# Patient Record
Sex: Male | Born: 1955
Health system: Southern US, Community
[De-identification: ages and names within clinical notes are randomized; demographics above are authoritative.]

## PROBLEM LIST (undated history)

## (undated) DIAGNOSIS — I252 Old myocardial infarction: Secondary | ICD-10-CM

## (undated) DIAGNOSIS — J449 Chronic obstructive pulmonary disease, unspecified: Secondary | ICD-10-CM

## (undated) DIAGNOSIS — M545 Low back pain, unspecified: Secondary | ICD-10-CM

## (undated) DIAGNOSIS — F319 Bipolar disorder, unspecified: Secondary | ICD-10-CM

## (undated) DIAGNOSIS — E785 Hyperlipidemia, unspecified: Secondary | ICD-10-CM

## (undated) DIAGNOSIS — R110 Nausea: Secondary | ICD-10-CM

## (undated) DIAGNOSIS — E119 Type 2 diabetes mellitus without complications: Secondary | ICD-10-CM

## (undated) DIAGNOSIS — D649 Anemia, unspecified: Secondary | ICD-10-CM

## (undated) DIAGNOSIS — R062 Wheezing: Secondary | ICD-10-CM

## (undated) DIAGNOSIS — R259 Unspecified abnormal involuntary movements: Secondary | ICD-10-CM

## (undated) DIAGNOSIS — R609 Edema, unspecified: Secondary | ICD-10-CM

## (undated) DIAGNOSIS — I219 Acute myocardial infarction, unspecified: Secondary | ICD-10-CM

## (undated) DIAGNOSIS — N183 Chronic kidney disease, stage 3 unspecified: Secondary | ICD-10-CM

## (undated) DIAGNOSIS — K635 Polyp of colon: Secondary | ICD-10-CM

## (undated) DIAGNOSIS — F411 Generalized anxiety disorder: Secondary | ICD-10-CM

## (undated) DIAGNOSIS — G250 Essential tremor: Secondary | ICD-10-CM

## (undated) DIAGNOSIS — J45909 Unspecified asthma, uncomplicated: Secondary | ICD-10-CM

## (undated) DIAGNOSIS — R42 Dizziness and giddiness: Secondary | ICD-10-CM

## (undated) DIAGNOSIS — Z9861 Coronary angioplasty status: Secondary | ICD-10-CM

## (undated) DIAGNOSIS — D696 Thrombocytopenia, unspecified: Secondary | ICD-10-CM

## (undated) DIAGNOSIS — D692 Other nonthrombocytopenic purpura: Secondary | ICD-10-CM

## (undated) DIAGNOSIS — M1710 Unilateral primary osteoarthritis, unspecified knee: Secondary | ICD-10-CM

## (undated) DIAGNOSIS — K219 Gastro-esophageal reflux disease without esophagitis: Secondary | ICD-10-CM

## (undated) DIAGNOSIS — F329 Major depressive disorder, single episode, unspecified: Secondary | ICD-10-CM

## (undated) DIAGNOSIS — I2510 Atherosclerotic heart disease of native coronary artery without angina pectoris: Secondary | ICD-10-CM

## (undated) DIAGNOSIS — N644 Mastodynia: Secondary | ICD-10-CM

## (undated) DIAGNOSIS — G4700 Insomnia, unspecified: Secondary | ICD-10-CM

## (undated) DIAGNOSIS — J309 Allergic rhinitis, unspecified: Secondary | ICD-10-CM

## (undated) DIAGNOSIS — Z720 Tobacco use: Secondary | ICD-10-CM

## (undated) HISTORY — DX: Essential tremor: G25.0

## (undated) HISTORY — PX: ROTATOR CUFF REPAIR: SHX139

## (undated) HISTORY — DX: Insomnia, unspecified: G47.00

## (undated) HISTORY — DX: Generalized anxiety disorder: F41.1

## (undated) HISTORY — DX: Gastro-esophageal reflux disease without esophagitis: K21.9

## (undated) HISTORY — PX: OTHER SURGICAL HISTORY: SHX169

## (undated) HISTORY — DX: Polyp of colon: K63.5

## (undated) HISTORY — PX: TONSILLECTOMY: SUR1361

## (undated) HISTORY — DX: Tobacco use: Z72.0

## (undated) HISTORY — DX: Hyperlipidemia, unspecified: E78.5

## (undated) HISTORY — PX: APPENDECTOMY: SHX54

## (undated) HISTORY — DX: Bipolar disorder, unspecified: F31.9

## (undated) HISTORY — PX: CHOLECYSTECTOMY: SHX55

## (undated) HISTORY — PX: TOTAL KNEE ARTHROPLASTY: SHX125

## (undated) HISTORY — DX: Low back pain: M54.5

## (undated) HISTORY — DX: Unilateral primary osteoarthritis, unspecified knee: M17.10

## (undated) HISTORY — DX: Old myocardial infarction: I25.2

## (undated) HISTORY — DX: Unspecified abnormal involuntary movements: R25.9

## (undated) HISTORY — DX: Nausea: R11.0

## (undated) HISTORY — DX: Low back pain, unspecified: M54.50

## (undated) HISTORY — DX: Unspecified asthma, uncomplicated: J45.909

## (undated) HISTORY — DX: Allergic rhinitis, unspecified: J30.9

## (undated) HISTORY — DX: Major depressive disorder, single episode, unspecified: F32.9

## (undated) HISTORY — DX: Thrombocytopenia, unspecified: D69.6

## (undated) HISTORY — PX: KNEE ARTHROSCOPY: SUR90

## (undated) HISTORY — DX: Chronic obstructive pulmonary disease, unspecified: J44.9

## (undated) HISTORY — DX: Edema, unspecified: R60.9

## (undated) HISTORY — DX: Mastodynia: N64.4

## (undated) HISTORY — DX: Wheezing: R06.2

## (undated) HISTORY — DX: Other nonthrombocytopenic purpura: D69.2

## (undated) HISTORY — DX: Anemia, unspecified: D64.9

## (undated) HISTORY — DX: Type 2 diabetes mellitus without complications: E11.9

---

## 1998-07-15 ENCOUNTER — Emergency Department (HOSPITAL_COMMUNITY): Admission: EM | Admit: 1998-07-15 | Discharge: 1998-07-15 | Payer: Self-pay | Admitting: Emergency Medicine

## 1998-08-03 ENCOUNTER — Emergency Department (HOSPITAL_COMMUNITY): Admission: EM | Admit: 1998-08-03 | Discharge: 1998-08-03 | Payer: Self-pay

## 1999-01-10 ENCOUNTER — Emergency Department (HOSPITAL_COMMUNITY): Admission: EM | Admit: 1999-01-10 | Discharge: 1999-01-10 | Payer: Self-pay

## 1999-07-15 ENCOUNTER — Emergency Department (HOSPITAL_COMMUNITY): Admission: EM | Admit: 1999-07-15 | Discharge: 1999-07-15 | Payer: Self-pay | Admitting: Emergency Medicine

## 1999-07-24 ENCOUNTER — Emergency Department (HOSPITAL_COMMUNITY): Admission: EM | Admit: 1999-07-24 | Discharge: 1999-07-24 | Payer: Self-pay | Admitting: Emergency Medicine

## 1999-08-18 ENCOUNTER — Emergency Department (HOSPITAL_COMMUNITY): Admission: EM | Admit: 1999-08-18 | Discharge: 1999-08-18 | Payer: Self-pay | Admitting: Emergency Medicine

## 1999-09-04 ENCOUNTER — Emergency Department (HOSPITAL_COMMUNITY): Admission: EM | Admit: 1999-09-04 | Discharge: 1999-09-04 | Payer: Self-pay | Admitting: Emergency Medicine

## 1999-11-08 ENCOUNTER — Emergency Department (HOSPITAL_COMMUNITY): Admission: EM | Admit: 1999-11-08 | Discharge: 1999-11-08 | Payer: Self-pay | Admitting: Emergency Medicine

## 2000-07-23 ENCOUNTER — Emergency Department (HOSPITAL_COMMUNITY): Admission: EM | Admit: 2000-07-23 | Discharge: 2000-07-23 | Payer: Self-pay | Admitting: Emergency Medicine

## 2000-08-07 ENCOUNTER — Encounter: Admission: RE | Admit: 2000-08-07 | Discharge: 2000-08-07 | Payer: Self-pay | Admitting: General Surgery

## 2000-08-07 ENCOUNTER — Encounter: Payer: Self-pay | Admitting: General Surgery

## 2000-08-08 ENCOUNTER — Ambulatory Visit (HOSPITAL_BASED_OUTPATIENT_CLINIC_OR_DEPARTMENT_OTHER): Admission: RE | Admit: 2000-08-08 | Discharge: 2000-08-08 | Payer: Self-pay | Admitting: General Surgery

## 2002-05-26 ENCOUNTER — Emergency Department (HOSPITAL_COMMUNITY): Admission: EM | Admit: 2002-05-26 | Discharge: 2002-05-26 | Payer: Self-pay | Admitting: Emergency Medicine

## 2002-09-12 ENCOUNTER — Encounter (HOSPITAL_BASED_OUTPATIENT_CLINIC_OR_DEPARTMENT_OTHER): Payer: Self-pay | Admitting: General Surgery

## 2002-09-17 ENCOUNTER — Ambulatory Visit (HOSPITAL_COMMUNITY): Admission: RE | Admit: 2002-09-17 | Discharge: 2002-09-17 | Payer: Self-pay | Admitting: General Surgery

## 2002-09-17 ENCOUNTER — Encounter (INDEPENDENT_AMBULATORY_CARE_PROVIDER_SITE_OTHER): Payer: Self-pay | Admitting: *Deleted

## 2002-11-21 ENCOUNTER — Emergency Department (HOSPITAL_COMMUNITY): Admission: EM | Admit: 2002-11-21 | Discharge: 2002-11-21 | Payer: Self-pay | Admitting: Emergency Medicine

## 2003-01-26 ENCOUNTER — Emergency Department (HOSPITAL_COMMUNITY): Admission: EM | Admit: 2003-01-26 | Discharge: 2003-01-26 | Payer: Self-pay

## 2003-04-27 ENCOUNTER — Emergency Department (HOSPITAL_COMMUNITY): Admission: EM | Admit: 2003-04-27 | Discharge: 2003-04-27 | Payer: Self-pay | Admitting: Emergency Medicine

## 2003-04-27 ENCOUNTER — Encounter: Payer: Self-pay | Admitting: Emergency Medicine

## 2003-09-14 ENCOUNTER — Emergency Department (HOSPITAL_COMMUNITY): Admission: EM | Admit: 2003-09-14 | Discharge: 2003-09-14 | Payer: Self-pay | Admitting: Emergency Medicine

## 2003-09-30 ENCOUNTER — Encounter: Admission: RE | Admit: 2003-09-30 | Discharge: 2003-09-30 | Payer: Self-pay | Admitting: Internal Medicine

## 2004-03-07 ENCOUNTER — Emergency Department (HOSPITAL_COMMUNITY): Admission: EM | Admit: 2004-03-07 | Discharge: 2004-03-07 | Payer: Self-pay | Admitting: Emergency Medicine

## 2004-07-28 ENCOUNTER — Ambulatory Visit (HOSPITAL_COMMUNITY): Admission: RE | Admit: 2004-07-28 | Discharge: 2004-07-28 | Payer: Self-pay | Admitting: Orthopedic Surgery

## 2004-08-15 ENCOUNTER — Inpatient Hospital Stay (HOSPITAL_COMMUNITY): Admission: RE | Admit: 2004-08-15 | Discharge: 2004-08-18 | Payer: Self-pay | Admitting: Orthopedic Surgery

## 2005-11-02 ENCOUNTER — Emergency Department (HOSPITAL_COMMUNITY): Admission: EM | Admit: 2005-11-02 | Discharge: 2005-11-02 | Payer: Self-pay | Admitting: Emergency Medicine

## 2006-01-18 ENCOUNTER — Ambulatory Visit: Payer: Self-pay | Admitting: Internal Medicine

## 2006-01-22 ENCOUNTER — Ambulatory Visit: Payer: Self-pay | Admitting: Internal Medicine

## 2006-02-01 ENCOUNTER — Ambulatory Visit (HOSPITAL_COMMUNITY): Admission: RE | Admit: 2006-02-01 | Discharge: 2006-02-01 | Payer: Self-pay | Admitting: Orthopedic Surgery

## 2006-07-15 ENCOUNTER — Emergency Department (HOSPITAL_COMMUNITY): Admission: EM | Admit: 2006-07-15 | Discharge: 2006-07-16 | Payer: Self-pay | Admitting: Emergency Medicine

## 2006-07-21 ENCOUNTER — Encounter (INDEPENDENT_AMBULATORY_CARE_PROVIDER_SITE_OTHER): Payer: Self-pay | Admitting: Specialist

## 2006-07-21 ENCOUNTER — Ambulatory Visit (HOSPITAL_COMMUNITY): Admission: RE | Admit: 2006-07-21 | Discharge: 2006-07-22 | Payer: Self-pay | Admitting: General Surgery

## 2006-12-10 ENCOUNTER — Ambulatory Visit: Payer: Self-pay | Admitting: Internal Medicine

## 2006-12-10 LAB — CONVERTED CEMR LAB
ALT: 45 units/L — ABNORMAL HIGH (ref 0–40)
AST: 32 units/L (ref 0–37)
Albumin: 4 g/dL (ref 3.5–5.2)
Alkaline Phosphatase: 142 units/L — ABNORMAL HIGH (ref 39–117)
BUN: 8 mg/dL (ref 6–23)
Basophils Absolute: 0 10*3/uL (ref 0.0–0.1)
Basophils Relative: 0.1 % (ref 0.0–1.0)
Bilirubin Urine: NEGATIVE
Bilirubin, Direct: 0.1 mg/dL (ref 0.0–0.3)
CO2: 27 meq/L (ref 19–32)
Calcium: 9.6 mg/dL (ref 8.4–10.5)
Chloride: 104 meq/L (ref 96–112)
Cholesterol: 182 mg/dL (ref 0–200)
Creatinine, Ser: 1 mg/dL (ref 0.4–1.5)
Eosinophils Absolute: 0.3 10*3/uL (ref 0.0–0.6)
Eosinophils Relative: 4.2 % (ref 0.0–5.0)
GFR calc Af Amer: 102 mL/min
GFR calc non Af Amer: 84 mL/min
Glucose, Bld: 119 mg/dL — ABNORMAL HIGH (ref 70–99)
HCT: 48.7 % (ref 39.0–52.0)
HDL: 29.7 mg/dL — ABNORMAL LOW (ref >39.0)
Hemoglobin, Urine: NEGATIVE
Hemoglobin: 17 g/dL (ref 13.0–17.0)
Ketones, ur: NEGATIVE mg/dL
LDL Cholesterol: 117 mg/dL — ABNORMAL HIGH (ref 0–99)
Leukocytes, UA: NEGATIVE
Lymphocytes Relative: 35 % (ref 12.0–46.0)
MCHC: 35 g/dL (ref 30.0–36.0)
MCV: 89.4 fL (ref 78.0–100.0)
Monocytes Absolute: 0.6 10*3/uL (ref 0.2–0.7)
Monocytes Relative: 8.9 % (ref 3.0–11.0)
Neutro Abs: 3.5 10*3/uL (ref 1.4–7.7)
Neutrophils Relative %: 51.8 % (ref 43.0–77.0)
Nitrite: NEGATIVE
PSA: 0.29 ng/mL (ref 0.10–4.00)
Platelets: 225 10*3/uL (ref 150–400)
Potassium: 4.2 meq/L (ref 3.5–5.1)
RBC: 5.45 M/uL (ref 4.22–5.81)
RDW: 13.8 % (ref 11.5–14.6)
Sodium: 139 meq/L (ref 135–145)
Specific Gravity, Urine: >=1.03 (ref 1.000–1.03)
TSH: 0.76 microintl units/mL (ref 0.35–5.50)
Total Bilirubin: 0.6 mg/dL (ref 0.3–1.2)
Total CHOL/HDL Ratio: 6.1
Total Protein, Urine: NEGATIVE mg/dL
Total Protein: 7 g/dL (ref 6.0–8.3)
Triglycerides: 178 mg/dL — ABNORMAL HIGH (ref 0–149)
Urine Glucose: NEGATIVE mg/dL
Urobilinogen, UA: 0.2 (ref 0.0–1.0)
VLDL: 36 mg/dL (ref 0–40)
WBC: 6.7 10*3/uL (ref 4.5–10.5)
pH: 6 (ref 5.0–8.0)

## 2007-06-21 ENCOUNTER — Encounter: Payer: Self-pay | Admitting: Internal Medicine

## 2007-06-21 DIAGNOSIS — R259 Unspecified abnormal involuntary movements: Secondary | ICD-10-CM | POA: Insufficient documentation

## 2007-06-21 HISTORY — DX: Unspecified abnormal involuntary movements: R25.9

## 2007-06-24 DIAGNOSIS — J45909 Unspecified asthma, uncomplicated: Secondary | ICD-10-CM | POA: Insufficient documentation

## 2007-06-24 DIAGNOSIS — F411 Generalized anxiety disorder: Secondary | ICD-10-CM | POA: Insufficient documentation

## 2007-06-24 DIAGNOSIS — F329 Major depressive disorder, single episode, unspecified: Secondary | ICD-10-CM

## 2007-06-24 DIAGNOSIS — F32A Depression, unspecified: Secondary | ICD-10-CM | POA: Insufficient documentation

## 2007-06-24 DIAGNOSIS — F3289 Other specified depressive episodes: Secondary | ICD-10-CM

## 2007-06-24 HISTORY — DX: Major depressive disorder, single episode, unspecified: F32.9

## 2007-06-24 HISTORY — DX: Generalized anxiety disorder: F41.1

## 2007-06-24 HISTORY — DX: Other specified depressive episodes: F32.89

## 2007-06-24 HISTORY — DX: Unspecified asthma, uncomplicated: J45.909

## 2007-09-03 ENCOUNTER — Telehealth (INDEPENDENT_AMBULATORY_CARE_PROVIDER_SITE_OTHER): Payer: Self-pay | Admitting: *Deleted

## 2007-09-03 DIAGNOSIS — M545 Low back pain, unspecified: Secondary | ICD-10-CM

## 2007-09-03 HISTORY — DX: Low back pain, unspecified: M54.50

## 2007-11-24 ENCOUNTER — Encounter: Payer: Self-pay | Admitting: Internal Medicine

## 2007-11-24 LAB — CONVERTED CEMR LAB
BUN: 13 mg/dL (ref 6–23)
Basophils Absolute: 0 10*3/uL (ref 0.0–0.1)
Basophils Relative: 0 % (ref 0–1)
CO2: 28 meq/L (ref 19–32)
Calcium: 9.5 mg/dL (ref 8.4–10.5)
Chloride: 104 meq/L (ref 96–112)
Cholesterol: 206 mg/dL — ABNORMAL HIGH (ref 0–200)
Creatinine, Ser: 1.19 mg/dL (ref 0.40–1.50)
Eosinophils Absolute: 0.3 10*3/uL (ref 0.0–0.7)
Eosinophils Relative: 4 % (ref 0–5)
Glucose, Bld: 77 mg/dL (ref 70–99)
HCT: 48.8 % (ref 39.0–52.0)
HDL: 28 mg/dL — ABNORMAL LOW (ref >39)
Hemoglobin: 16.4 g/dL (ref 13.0–17.0)
LDL Cholesterol: 132 mg/dL — ABNORMAL HIGH (ref 0–99)
Lymphocytes Relative: 31 % (ref 12–46)
Lymphs Abs: 2.3 10*3/uL (ref 0.7–4.0)
MCHC: 33.6 g/dL (ref 30.0–36.0)
MCV: 91.7 fL (ref 78.0–100.0)
Monocytes Absolute: 0.9 10*3/uL (ref 0.1–1.0)
Monocytes Relative: 12 % (ref 3–12)
Neutro Abs: 3.9 10*3/uL (ref 1.7–7.7)
Neutrophils Relative %: 53 % (ref 43–77)
Platelets: 185 10*3/uL (ref 150–400)
Potassium: 3.9 meq/L (ref 3.5–5.3)
RBC: 5.32 M/uL (ref 4.22–5.81)
RDW: 14.4 % (ref 11.5–15.5)
Sodium: 143 meq/L (ref 135–145)
Total CHOL/HDL Ratio: 7.4
Triglycerides: 232 mg/dL — ABNORMAL HIGH (ref <150)
VLDL: 46 mg/dL — ABNORMAL HIGH (ref 0–40)
Valproic Acid Lvl: 89.5 ug/mL (ref 50.0–100.0)
WBC: 7.4 10*3/uL (ref 4.0–10.5)

## 2007-11-26 ENCOUNTER — Ambulatory Visit: Payer: Self-pay | Admitting: Internal Medicine

## 2007-11-26 DIAGNOSIS — J45901 Unspecified asthma with (acute) exacerbation: Secondary | ICD-10-CM | POA: Insufficient documentation

## 2007-11-27 LAB — CONVERTED CEMR LAB
ALT: 34 units/L (ref 0–53)
AST: 24 units/L (ref 0–37)
Albumin: 4.2 g/dL (ref 3.5–5.2)
Alkaline Phosphatase: 131 units/L — ABNORMAL HIGH (ref 39–117)
BUN: 13 mg/dL (ref 6–23)
Basophils Absolute: 0.1 10*3/uL (ref 0.0–0.1)
Basophils Relative: 1 % (ref 0.0–1.0)
Bilirubin Urine: NEGATIVE
Bilirubin, Direct: 0.2 mg/dL (ref 0.0–0.3)
CO2: 32 meq/L (ref 19–32)
Calcium: 9.7 mg/dL (ref 8.4–10.5)
Chloride: 99 meq/L (ref 96–112)
Cholesterol: 203 mg/dL (ref 0–200)
Creatinine, Ser: 1 mg/dL (ref 0.4–1.5)
Direct LDL: 143.1 mg/dL
Eosinophils Absolute: 0.2 10*3/uL (ref 0.0–0.6)
Eosinophils Relative: 3 % (ref 0.0–5.0)
GFR calc Af Amer: 102 mL/min
GFR calc non Af Amer: 84 mL/min
Glucose, Bld: 120 mg/dL — ABNORMAL HIGH (ref 70–99)
HCT: 42.3 % (ref 39.0–52.0)
HDL: 25.1 mg/dL — ABNORMAL LOW (ref >39.0)
Hemoglobin, Urine: NEGATIVE
Hemoglobin: 13.9 g/dL (ref 13.0–17.0)
Ketones, ur: 15 mg/dL — AB
Leukocytes, UA: NEGATIVE
Lymphocytes Relative: 39.4 % (ref 12.0–46.0)
MCHC: 32.8 g/dL (ref 30.0–36.0)
MCV: 91.5 fL (ref 78.0–100.0)
Monocytes Absolute: 0.7 10*3/uL (ref 0.2–0.7)
Monocytes Relative: 9.8 % (ref 3.0–11.0)
Neutro Abs: 3.4 10*3/uL (ref 1.4–7.7)
Neutrophils Relative %: 46.8 % (ref 43.0–77.0)
Nitrite: NEGATIVE
PSA: 0.4 ng/mL (ref 0.10–4.00)
Platelets: 173 10*3/uL (ref 150–400)
Potassium: 3.9 meq/L (ref 3.5–5.1)
RBC: 4.62 M/uL (ref 4.22–5.81)
RDW: 13.5 % (ref 11.5–14.6)
Sodium: 138 meq/L (ref 135–145)
Specific Gravity, Urine: 1.02 (ref 1.000–1.03)
TSH: 0.6 microintl units/mL (ref 0.35–5.50)
Total Bilirubin: 0.9 mg/dL (ref 0.3–1.2)
Total CHOL/HDL Ratio: 8.1
Total Protein, Urine: NEGATIVE mg/dL
Total Protein: 7.4 g/dL (ref 6.0–8.3)
Triglycerides: 221 mg/dL (ref 0–149)
Urine Glucose: NEGATIVE mg/dL
Urobilinogen, UA: 1 (ref 0.0–1.0)
VLDL: 44 mg/dL — ABNORMAL HIGH (ref 0–40)
WBC: 7.3 10*3/uL (ref 4.5–10.5)
pH: 7 (ref 5.0–8.0)

## 2007-11-29 ENCOUNTER — Telehealth: Payer: Self-pay | Admitting: Internal Medicine

## 2008-04-06 ENCOUNTER — Telehealth: Payer: Self-pay | Admitting: Internal Medicine

## 2008-07-10 ENCOUNTER — Encounter: Admission: RE | Admit: 2008-07-10 | Discharge: 2008-07-10 | Payer: Self-pay | Admitting: General Surgery

## 2008-07-16 ENCOUNTER — Encounter: Payer: Self-pay | Admitting: Internal Medicine

## 2008-07-21 ENCOUNTER — Ambulatory Visit: Payer: Self-pay | Admitting: Internal Medicine

## 2008-07-21 DIAGNOSIS — E1169 Type 2 diabetes mellitus with other specified complication: Secondary | ICD-10-CM | POA: Insufficient documentation

## 2008-07-21 DIAGNOSIS — E119 Type 2 diabetes mellitus without complications: Secondary | ICD-10-CM

## 2008-07-21 DIAGNOSIS — E785 Hyperlipidemia, unspecified: Secondary | ICD-10-CM | POA: Insufficient documentation

## 2008-07-21 DIAGNOSIS — Z794 Long term (current) use of insulin: Secondary | ICD-10-CM

## 2008-07-21 DIAGNOSIS — N644 Mastodynia: Secondary | ICD-10-CM | POA: Insufficient documentation

## 2008-07-21 DIAGNOSIS — R609 Edema, unspecified: Secondary | ICD-10-CM | POA: Insufficient documentation

## 2008-07-21 DIAGNOSIS — IMO0001 Reserved for inherently not codable concepts without codable children: Secondary | ICD-10-CM | POA: Insufficient documentation

## 2008-07-21 HISTORY — DX: Mastodynia: N64.4

## 2008-07-21 HISTORY — DX: Edema, unspecified: R60.9

## 2008-07-21 HISTORY — DX: Hyperlipidemia, unspecified: E78.5

## 2008-07-21 HISTORY — DX: Type 2 diabetes mellitus without complications: E11.9

## 2008-07-23 ENCOUNTER — Encounter: Payer: Self-pay | Admitting: Internal Medicine

## 2008-07-23 LAB — CONVERTED CEMR LAB
Cholesterol: 193 mg/dL (ref 0–200)
HDL: 24.9 mg/dL — ABNORMAL LOW (ref >39.0)
Hgb A1c MFr Bld: 6.7 % — ABNORMAL HIGH (ref 4.6–6.0)
LDL Cholesterol: 132 mg/dL — ABNORMAL HIGH (ref 0–99)
Total CHOL/HDL Ratio: 7.8
Triglycerides: 179 mg/dL — ABNORMAL HIGH (ref 0–149)
VLDL: 36 mg/dL (ref 0–40)

## 2008-07-27 ENCOUNTER — Telehealth (INDEPENDENT_AMBULATORY_CARE_PROVIDER_SITE_OTHER): Payer: Self-pay | Admitting: *Deleted

## 2008-08-03 ENCOUNTER — Encounter: Admission: RE | Admit: 2008-08-03 | Discharge: 2008-08-03 | Payer: Self-pay | Admitting: Internal Medicine

## 2008-08-25 ENCOUNTER — Ambulatory Visit: Payer: Self-pay | Admitting: Internal Medicine

## 2008-08-25 DIAGNOSIS — IMO0002 Reserved for concepts with insufficient information to code with codable children: Secondary | ICD-10-CM

## 2008-08-25 DIAGNOSIS — K219 Gastro-esophageal reflux disease without esophagitis: Secondary | ICD-10-CM | POA: Insufficient documentation

## 2008-08-25 DIAGNOSIS — M1710 Unilateral primary osteoarthritis, unspecified knee: Secondary | ICD-10-CM

## 2008-08-25 HISTORY — DX: Reserved for concepts with insufficient information to code with codable children: IMO0002

## 2008-08-25 HISTORY — DX: Gastro-esophageal reflux disease without esophagitis: K21.9

## 2008-08-25 LAB — CONVERTED CEMR LAB
ALT: 61 units/L — ABNORMAL HIGH (ref 0–53)
AST: 36 units/L (ref 0–37)
Albumin: 3.8 g/dL (ref 3.5–5.2)
Alkaline Phosphatase: 121 units/L — ABNORMAL HIGH (ref 39–117)
BUN: 12 mg/dL (ref 6–23)
Bilirubin, Direct: 0.2 mg/dL (ref 0.0–0.3)
CO2: 29 meq/L (ref 19–32)
Calcium: 9.5 mg/dL (ref 8.4–10.5)
Chloride: 102 meq/L (ref 96–112)
Cholesterol: 100 mg/dL (ref 0–200)
Creatinine, Ser: 0.9 mg/dL (ref 0.4–1.5)
GFR calc Af Amer: 114 mL/min
GFR calc non Af Amer: 95 mL/min
Glucose, Bld: 107 mg/dL — ABNORMAL HIGH (ref 70–99)
HDL: 25.9 mg/dL — ABNORMAL LOW (ref >39.0)
Hgb A1c MFr Bld: 6.7 % — ABNORMAL HIGH (ref 4.6–6.0)
LDL Cholesterol: 48 mg/dL (ref 0–99)
Potassium: 4 meq/L (ref 3.5–5.1)
Sodium: 138 meq/L (ref 135–145)
Total Bilirubin: 0.7 mg/dL (ref 0.3–1.2)
Total CHOL/HDL Ratio: 3.9
Total Protein: 7.2 g/dL (ref 6.0–8.3)
Triglycerides: 132 mg/dL (ref 0–149)
VLDL: 26 mg/dL (ref 0–40)

## 2008-08-31 ENCOUNTER — Telehealth (INDEPENDENT_AMBULATORY_CARE_PROVIDER_SITE_OTHER): Payer: Self-pay | Admitting: *Deleted

## 2008-12-28 ENCOUNTER — Telehealth (INDEPENDENT_AMBULATORY_CARE_PROVIDER_SITE_OTHER): Payer: Self-pay | Admitting: *Deleted

## 2009-01-01 ENCOUNTER — Ambulatory Visit: Payer: Self-pay | Admitting: Internal Medicine

## 2009-01-01 DIAGNOSIS — J209 Acute bronchitis, unspecified: Secondary | ICD-10-CM | POA: Insufficient documentation

## 2009-01-06 ENCOUNTER — Encounter: Payer: Self-pay | Admitting: *Deleted

## 2009-01-06 ENCOUNTER — Emergency Department (HOSPITAL_COMMUNITY): Admission: EM | Admit: 2009-01-06 | Discharge: 2009-01-07 | Payer: Self-pay | Admitting: *Deleted

## 2009-01-13 ENCOUNTER — Telehealth (INDEPENDENT_AMBULATORY_CARE_PROVIDER_SITE_OTHER): Payer: Self-pay | Admitting: *Deleted

## 2009-01-20 ENCOUNTER — Ambulatory Visit: Payer: Self-pay | Admitting: Internal Medicine

## 2009-01-25 ENCOUNTER — Telehealth: Payer: Self-pay | Admitting: Internal Medicine

## 2009-01-28 ENCOUNTER — Encounter: Payer: Self-pay | Admitting: Internal Medicine

## 2009-02-01 ENCOUNTER — Emergency Department (HOSPITAL_COMMUNITY): Admission: EM | Admit: 2009-02-01 | Discharge: 2009-02-02 | Payer: Self-pay | Admitting: Emergency Medicine

## 2009-03-18 ENCOUNTER — Ambulatory Visit: Payer: Self-pay | Admitting: Internal Medicine

## 2009-03-18 DIAGNOSIS — J309 Allergic rhinitis, unspecified: Secondary | ICD-10-CM

## 2009-03-18 HISTORY — DX: Allergic rhinitis, unspecified: J30.9

## 2009-03-18 LAB — CONVERTED CEMR LAB
ALT: 33 units/L (ref 0–53)
AST: 26 units/L (ref 0–37)
Albumin: 4.1 g/dL (ref 3.5–5.2)
Alkaline Phosphatase: 112 units/L (ref 39–117)
BUN: 12 mg/dL (ref 6–23)
Basophils Absolute: 0 10*3/uL (ref 0.0–0.1)
Basophils Relative: 0.2 % (ref 0.0–3.0)
Bilirubin Urine: NEGATIVE
Bilirubin, Direct: 0.1 mg/dL (ref 0.0–0.3)
CO2: 31 meq/L (ref 19–32)
Calcium: 9.3 mg/dL (ref 8.4–10.5)
Chloride: 103 meq/L (ref 96–112)
Cholesterol: 140 mg/dL (ref 0–200)
Creatinine, Ser: 0.9 mg/dL (ref 0.4–1.5)
Creatinine,U: 315.9 mg/dL
Eosinophils Absolute: 0.2 10*3/uL (ref 0.0–0.7)
Eosinophils Relative: 2.6 % (ref 0.0–5.0)
GFR calc non Af Amer: 94.25 mL/min (ref >60)
Glucose, Bld: 95 mg/dL (ref 70–99)
HCT: 42.3 % (ref 39.0–52.0)
HDL: 28.3 mg/dL — ABNORMAL LOW (ref >39.00)
Hemoglobin, Urine: NEGATIVE
Hemoglobin: 15.1 g/dL (ref 13.0–17.0)
Ketones, ur: NEGATIVE mg/dL
LDL Cholesterol: 82 mg/dL (ref 0–99)
Leukocytes, UA: NEGATIVE
Lymphocytes Relative: 37.9 % (ref 12.0–46.0)
Lymphs Abs: 2.5 10*3/uL (ref 0.7–4.0)
MCHC: 35.8 g/dL (ref 30.0–36.0)
MCV: 90.9 fL (ref 78.0–100.0)
Microalb Creat Ratio: 2.2 mg/g (ref 0.0–30.0)
Microalb, Ur: 0.7 mg/dL (ref 0.0–1.9)
Monocytes Absolute: 0.6 10*3/uL (ref 0.1–1.0)
Monocytes Relative: 9.3 % (ref 3.0–12.0)
Neutro Abs: 3.4 10*3/uL (ref 1.4–7.7)
Neutrophils Relative %: 50 % (ref 43.0–77.0)
Nitrite: NEGATIVE
PSA: 0.29 ng/mL (ref 0.10–4.00)
Platelets: 180 10*3/uL (ref 150.0–400.0)
Potassium: 3.9 meq/L (ref 3.5–5.1)
RBC: 4.65 M/uL (ref 4.22–5.81)
RDW: 14.4 % (ref 11.5–14.6)
Sodium: 140 meq/L (ref 135–145)
Specific Gravity, Urine: >=1.03 (ref 1.000–1.030)
TSH: 1.26 microintl units/mL (ref 0.35–5.50)
Total Bilirubin: 0.7 mg/dL (ref 0.3–1.2)
Total CHOL/HDL Ratio: 5
Total Protein: 7 g/dL (ref 6.0–8.3)
Triglycerides: 150 mg/dL — ABNORMAL HIGH (ref 0.0–149.0)
Urine Glucose: NEGATIVE mg/dL
Urobilinogen, UA: 0.2 (ref 0.0–1.0)
VLDL: 30 mg/dL (ref 0.0–40.0)
WBC: 6.7 10*3/uL (ref 4.5–10.5)
pH: 6 (ref 5.0–8.0)

## 2009-03-25 ENCOUNTER — Telehealth (INDEPENDENT_AMBULATORY_CARE_PROVIDER_SITE_OTHER): Payer: Self-pay | Admitting: *Deleted

## 2009-03-26 ENCOUNTER — Telehealth: Payer: Self-pay | Admitting: Internal Medicine

## 2009-03-29 ENCOUNTER — Telehealth (INDEPENDENT_AMBULATORY_CARE_PROVIDER_SITE_OTHER): Payer: Self-pay | Admitting: *Deleted

## 2009-03-30 ENCOUNTER — Ambulatory Visit: Payer: Self-pay

## 2009-03-30 ENCOUNTER — Encounter: Payer: Self-pay | Admitting: Internal Medicine

## 2009-03-31 ENCOUNTER — Telehealth: Payer: Self-pay | Admitting: Internal Medicine

## 2009-04-01 ENCOUNTER — Telehealth: Payer: Self-pay | Admitting: Internal Medicine

## 2009-05-26 ENCOUNTER — Telehealth: Payer: Self-pay | Admitting: Internal Medicine

## 2009-05-28 ENCOUNTER — Emergency Department (HOSPITAL_COMMUNITY): Admission: EM | Admit: 2009-05-28 | Discharge: 2009-05-28 | Payer: Self-pay | Admitting: Emergency Medicine

## 2009-05-31 ENCOUNTER — Ambulatory Visit: Payer: Self-pay | Admitting: Internal Medicine

## 2009-05-31 LAB — CONVERTED CEMR LAB
BUN: 5 mg/dL — ABNORMAL LOW (ref 6–23)
CO2: 32 meq/L (ref 19–32)
Calcium: 9.2 mg/dL (ref 8.4–10.5)
Chloride: 105 meq/L (ref 96–112)
Cholesterol: 151 mg/dL (ref 0–200)
Creatinine, Ser: 1 mg/dL (ref 0.4–1.5)
GFR calc non Af Amer: 83.4 mL/min (ref >60)
Glucose, Bld: 110 mg/dL — ABNORMAL HIGH (ref 70–99)
HDL: 34.1 mg/dL — ABNORMAL LOW (ref >39.00)
Hgb A1c MFr Bld: 6.3 % (ref 4.6–6.5)
LDL Cholesterol: 88 mg/dL (ref 0–99)
Potassium: 4.6 meq/L (ref 3.5–5.1)
Sodium: 142 meq/L (ref 135–145)
Total CHOL/HDL Ratio: 4
Triglycerides: 144 mg/dL (ref 0.0–149.0)
VLDL: 28.8 mg/dL (ref 0.0–40.0)

## 2009-07-13 ENCOUNTER — Ambulatory Visit: Payer: Self-pay | Admitting: Internal Medicine

## 2009-07-13 DIAGNOSIS — R062 Wheezing: Secondary | ICD-10-CM

## 2009-07-13 HISTORY — DX: Wheezing: R06.2

## 2009-08-05 ENCOUNTER — Ambulatory Visit: Payer: Self-pay | Admitting: Internal Medicine

## 2009-09-08 ENCOUNTER — Ambulatory Visit: Payer: Self-pay | Admitting: Internal Medicine

## 2009-09-08 LAB — CONVERTED CEMR LAB
BUN: 7 mg/dL (ref 6–23)
CO2: 31 meq/L (ref 19–32)
Calcium: 9.2 mg/dL (ref 8.4–10.5)
Chloride: 101 meq/L (ref 96–112)
Cholesterol: 106 mg/dL (ref 0–200)
Creatinine, Ser: 1.1 mg/dL (ref 0.4–1.5)
Direct LDL: 57 mg/dL
GFR calc non Af Amer: 74.63 mL/min (ref >60)
Glucose, Bld: 169 mg/dL — ABNORMAL HIGH (ref 70–99)
HDL: 30.5 mg/dL — ABNORMAL LOW (ref >39.00)
Hgb A1c MFr Bld: 7.3 % — ABNORMAL HIGH (ref 4.6–6.5)
Potassium: 4.5 meq/L (ref 3.5–5.1)
Sodium: 141 meq/L (ref 135–145)
Total CHOL/HDL Ratio: 3
Triglycerides: 201 mg/dL — ABNORMAL HIGH (ref 0.0–149.0)
VLDL: 40.2 mg/dL — ABNORMAL HIGH (ref 0.0–40.0)

## 2009-09-13 ENCOUNTER — Ambulatory Visit: Payer: Self-pay | Admitting: Internal Medicine

## 2009-10-04 ENCOUNTER — Telehealth: Payer: Self-pay | Admitting: Internal Medicine

## 2009-10-06 ENCOUNTER — Encounter: Payer: Self-pay | Admitting: Internal Medicine

## 2009-10-14 ENCOUNTER — Telehealth: Payer: Self-pay | Admitting: Internal Medicine

## 2009-11-08 ENCOUNTER — Ambulatory Visit: Payer: Self-pay | Admitting: Internal Medicine

## 2009-11-09 ENCOUNTER — Telehealth: Payer: Self-pay | Admitting: Internal Medicine

## 2009-11-10 ENCOUNTER — Encounter: Payer: Self-pay | Admitting: Internal Medicine

## 2009-11-11 LAB — CONVERTED CEMR LAB
ALT: 44 units/L (ref 0–53)
AST: 39 units/L — ABNORMAL HIGH (ref 0–37)
Albumin: 4.1 g/dL (ref 3.5–5.2)
Alkaline Phosphatase: 92 units/L (ref 39–117)
BUN: 11 mg/dL (ref 6–23)
Basophils Absolute: 0.2 10*3/uL — ABNORMAL HIGH (ref 0.0–0.1)
Basophils Relative: 2.3 % (ref 0.0–3.0)
Bilirubin Urine: NEGATIVE
Bilirubin, Direct: 0.1 mg/dL (ref 0.0–0.3)
CO2: 26 meq/L (ref 19–32)
Calcium: 9.7 mg/dL (ref 8.4–10.5)
Chloride: 106 meq/L (ref 96–112)
Cholesterol: 198 mg/dL (ref 0–200)
Creatinine, Ser: 0.9 mg/dL (ref 0.4–1.5)
Creatinine,U: 97.3 mg/dL
Direct LDL: 141.3 mg/dL
Eosinophils Absolute: 0.5 10*3/uL (ref 0.0–0.7)
Eosinophils Relative: 6.7 % — ABNORMAL HIGH (ref 0.0–5.0)
GFR calc non Af Amer: 94.02 mL/min (ref >60)
Glucose, Bld: 129 mg/dL — ABNORMAL HIGH (ref 70–99)
HCT: 44.1 % (ref 39.0–52.0)
HDL: 31.4 mg/dL — ABNORMAL LOW (ref >39.00)
Hemoglobin, Urine: NEGATIVE
Hemoglobin: 14.8 g/dL (ref 13.0–17.0)
Hgb A1c MFr Bld: 7.2 % — ABNORMAL HIGH (ref 4.6–6.5)
Ketones, ur: NEGATIVE mg/dL
Leukocytes, UA: NEGATIVE
Lymphocytes Relative: 38.2 % (ref 12.0–46.0)
Lymphs Abs: 2.8 10*3/uL (ref 0.7–4.0)
MCHC: 33.7 g/dL (ref 30.0–36.0)
MCV: 92 fL (ref 78.0–100.0)
Microalb Creat Ratio: 3.1 mg/g (ref 0.0–30.0)
Microalb, Ur: 0.3 mg/dL (ref 0.0–1.9)
Monocytes Absolute: 0.6 10*3/uL (ref 0.1–1.0)
Monocytes Relative: 8.4 % (ref 3.0–12.0)
Neutro Abs: 3.1 10*3/uL (ref 1.4–7.7)
Neutrophils Relative %: 44.4 % (ref 43.0–77.0)
Nitrite: NEGATIVE
PSA: 0.22 ng/mL (ref 0.10–4.00)
Platelets: 202 10*3/uL (ref 150.0–400.0)
Potassium: 4.7 meq/L (ref 3.5–5.1)
RBC: 4.79 M/uL (ref 4.22–5.81)
RDW: 12.8 % (ref 11.5–14.6)
Sodium: 138 meq/L (ref 135–145)
Specific Gravity, Urine: 1.02 (ref 1.000–1.030)
TSH: 1.06 microintl units/mL (ref 0.35–5.50)
Total Bilirubin: 0.6 mg/dL (ref 0.3–1.2)
Total CHOL/HDL Ratio: 6
Total Protein, Urine: NEGATIVE mg/dL
Total Protein: 7 g/dL (ref 6.0–8.3)
Triglycerides: 212 mg/dL — ABNORMAL HIGH (ref 0.0–149.0)
Urine Glucose: NEGATIVE mg/dL
Urobilinogen, UA: 0.2 (ref 0.0–1.0)
VLDL: 42.4 mg/dL — ABNORMAL HIGH (ref 0.0–40.0)
WBC: 7.2 10*3/uL (ref 4.5–10.5)
pH: 5.5 (ref 5.0–8.0)

## 2009-11-18 ENCOUNTER — Encounter: Payer: Self-pay | Admitting: Internal Medicine

## 2009-12-16 ENCOUNTER — Encounter: Payer: Self-pay | Admitting: Internal Medicine

## 2010-02-10 ENCOUNTER — Telehealth: Payer: Self-pay | Admitting: Internal Medicine

## 2010-02-28 ENCOUNTER — Encounter: Payer: Self-pay | Admitting: Internal Medicine

## 2010-03-22 ENCOUNTER — Ambulatory Visit: Payer: Self-pay | Admitting: Internal Medicine

## 2010-03-22 LAB — CONVERTED CEMR LAB
BUN: 12 mg/dL (ref 6–23)
CO2: 29 meq/L (ref 19–32)
Calcium: 9.6 mg/dL (ref 8.4–10.5)
Chloride: 99 meq/L (ref 96–112)
Cholesterol: 117 mg/dL (ref 0–200)
Creatinine, Ser: 1 mg/dL (ref 0.4–1.5)
GFR calc non Af Amer: 84.11 mL/min (ref >60)
Glucose, Bld: 174 mg/dL — ABNORMAL HIGH (ref 70–99)
HDL: 26.3 mg/dL — ABNORMAL LOW (ref >39.00)
Hgb A1c MFr Bld: 7.7 % — ABNORMAL HIGH (ref 4.6–6.5)
LDL Cholesterol: 56 mg/dL (ref 0–99)
Potassium: 4.5 meq/L (ref 3.5–5.1)
Sodium: 137 meq/L (ref 135–145)
Total CHOL/HDL Ratio: 4
Triglycerides: 175 mg/dL — ABNORMAL HIGH (ref 0.0–149.0)
VLDL: 35 mg/dL (ref 0.0–40.0)

## 2010-03-28 ENCOUNTER — Ambulatory Visit: Payer: Self-pay | Admitting: Internal Medicine

## 2010-03-28 DIAGNOSIS — G4700 Insomnia, unspecified: Secondary | ICD-10-CM | POA: Insufficient documentation

## 2010-03-28 HISTORY — DX: Insomnia, unspecified: G47.00

## 2010-04-06 ENCOUNTER — Encounter: Payer: Self-pay | Admitting: Internal Medicine

## 2010-04-06 ENCOUNTER — Telehealth: Payer: Self-pay | Admitting: Internal Medicine

## 2010-04-11 ENCOUNTER — Telehealth: Payer: Self-pay | Admitting: Internal Medicine

## 2010-04-13 ENCOUNTER — Telehealth: Payer: Self-pay | Admitting: Internal Medicine

## 2010-06-14 ENCOUNTER — Ambulatory Visit: Payer: Self-pay | Admitting: Internal Medicine

## 2010-06-14 ENCOUNTER — Telehealth: Payer: Self-pay | Admitting: Internal Medicine

## 2010-06-14 ENCOUNTER — Other Ambulatory Visit: Payer: Self-pay | Admitting: Emergency Medicine

## 2010-06-14 ENCOUNTER — Emergency Department (HOSPITAL_COMMUNITY): Admission: EM | Admit: 2010-06-14 | Discharge: 2010-06-14 | Payer: Self-pay | Admitting: Emergency Medicine

## 2010-06-14 DIAGNOSIS — R110 Nausea: Secondary | ICD-10-CM

## 2010-06-14 HISTORY — DX: Nausea: R11.0

## 2010-06-14 LAB — CONVERTED CEMR LAB
BUN: 8 mg/dL (ref 6–23)
CO2: 27 meq/L (ref 19–32)
Calcium: 9.2 mg/dL (ref 8.4–10.5)
Chloride: 87 meq/L — ABNORMAL LOW (ref 96–112)
Creatinine, Ser: 1 mg/dL (ref 0.4–1.5)
GFR calc non Af Amer: 85.02 mL/min (ref >60)
Glucose, Bld: 840 mg/dL (ref 70–99)
Hgb A1c MFr Bld: 13.4 % — ABNORMAL HIGH (ref 4.6–6.5)
Potassium: 4.5 meq/L (ref 3.5–5.1)
Sodium: 126 meq/L — ABNORMAL LOW (ref 135–145)

## 2010-06-17 ENCOUNTER — Telehealth: Payer: Self-pay | Admitting: Internal Medicine

## 2010-06-20 ENCOUNTER — Telehealth: Payer: Self-pay | Admitting: Internal Medicine

## 2010-06-28 ENCOUNTER — Ambulatory Visit: Payer: Self-pay | Admitting: Internal Medicine

## 2010-06-30 ENCOUNTER — Telehealth: Payer: Self-pay | Admitting: Internal Medicine

## 2010-07-04 ENCOUNTER — Telehealth: Payer: Self-pay | Admitting: Internal Medicine

## 2010-07-06 ENCOUNTER — Telehealth: Payer: Self-pay | Admitting: Internal Medicine

## 2010-07-11 ENCOUNTER — Telehealth: Payer: Self-pay | Admitting: Internal Medicine

## 2010-07-15 ENCOUNTER — Telehealth: Payer: Self-pay | Admitting: Internal Medicine

## 2010-08-22 ENCOUNTER — Telehealth (INDEPENDENT_AMBULATORY_CARE_PROVIDER_SITE_OTHER): Payer: Self-pay | Admitting: *Deleted

## 2010-09-23 ENCOUNTER — Ambulatory Visit: Payer: Self-pay | Admitting: Internal Medicine

## 2010-09-23 LAB — CONVERTED CEMR LAB
ALT: 30 units/L (ref 0–53)
AST: 42 units/L — ABNORMAL HIGH (ref 0–37)
Albumin: 3.9 g/dL (ref 3.5–5.2)
Alkaline Phosphatase: 92 units/L (ref 39–117)
BUN: 11 mg/dL (ref 6–23)
Basophils Absolute: 0.1 10*3/uL (ref 0.0–0.1)
Basophils Relative: 1.2 % (ref 0.0–3.0)
Bilirubin Urine: NEGATIVE
Bilirubin, Direct: 0.1 mg/dL (ref 0.0–0.3)
Blood, UA: NEGATIVE
CO2: 30 meq/L (ref 19–32)
Calcium: 9.3 mg/dL (ref 8.4–10.5)
Chloride: 101 meq/L (ref 96–112)
Cholesterol: 123 mg/dL (ref 0–200)
Creatinine, Ser: 1 mg/dL (ref 0.4–1.5)
Creatinine,U: 101.7 mg/dL
Direct LDL: 64.7 mg/dL
Eosinophils Absolute: 0.2 10*3/uL (ref 0.0–0.7)
Eosinophils Relative: 3.2 % (ref 0.0–5.0)
GFR calc non Af Amer: 88.04 mL/min (ref >60)
Glucose, Bld: 97 mg/dL (ref 70–99)
HCT: 44.5 % (ref 39.0–52.0)
HDL: 25.4 mg/dL — ABNORMAL LOW (ref >39.00)
Hemoglobin: 15.3 g/dL (ref 13.0–17.0)
Hgb A1c MFr Bld: 6.8 % — ABNORMAL HIGH (ref 4.6–6.5)
Ketones, ur: NEGATIVE mg/dL
Leukocytes, UA: NEGATIVE
Lymphocytes Relative: 40.1 % (ref 12.0–46.0)
Lymphs Abs: 3 10*3/uL (ref 0.7–4.0)
MCHC: 34.4 g/dL (ref 30.0–36.0)
MCV: 93.9 fL (ref 78.0–100.0)
Microalb Creat Ratio: 0.6 mg/g (ref 0.0–30.0)
Microalb, Ur: 0.6 mg/dL (ref 0.0–1.9)
Monocytes Absolute: 0.6 10*3/uL (ref 0.1–1.0)
Monocytes Relative: 8.5 % (ref 3.0–12.0)
Neutro Abs: 3.5 10*3/uL (ref 1.4–7.7)
Neutrophils Relative %: 47 % (ref 43.0–77.0)
Nitrite: NEGATIVE
PSA: 0.28 ng/mL (ref 0.10–4.00)
Platelets: 131 10*3/uL — ABNORMAL LOW (ref 150.0–400.0)
Potassium: 4.7 meq/L (ref 3.5–5.1)
RBC: 4.74 M/uL (ref 4.22–5.81)
RDW: 14.3 % (ref 11.5–14.6)
Sodium: 138 meq/L (ref 135–145)
Specific Gravity, Urine: 1.015 (ref 1.000–1.030)
TSH: 1.58 microintl units/mL (ref 0.35–5.50)
Total Bilirubin: 0.5 mg/dL (ref 0.3–1.2)
Total CHOL/HDL Ratio: 5
Total Protein, Urine: NEGATIVE mg/dL
Total Protein: 7 g/dL (ref 6.0–8.3)
Triglycerides: 243 mg/dL — ABNORMAL HIGH (ref 0.0–149.0)
Urine Glucose: NEGATIVE mg/dL
Urobilinogen, UA: 0.2 (ref 0.0–1.0)
VLDL: 48.6 mg/dL — ABNORMAL HIGH (ref 0.0–40.0)
WBC: 7.5 10*3/uL (ref 4.5–10.5)
pH: 6 (ref 5.0–8.0)

## 2010-09-27 ENCOUNTER — Ambulatory Visit: Payer: Self-pay | Admitting: Internal Medicine

## 2010-10-19 ENCOUNTER — Telehealth: Payer: Self-pay | Admitting: Internal Medicine

## 2010-10-21 ENCOUNTER — Encounter: Payer: Self-pay | Admitting: Internal Medicine

## 2010-11-22 NOTE — Progress Notes (Signed)
Summary: Glucose Log From Patient  Glucose Log From Patient   Imported ByLennie Odor 06/30/2010 16:00:56  _____________________________________________________________________  External Attachment:    Type:   Image     Comment:   External Document

## 2010-11-22 NOTE — Progress Notes (Signed)
Summary: CBG readings  Phone Note Call from Patient Call back at Home Phone [phone]   Caller: Patient Summary of Call: Pt called to inform MD of CBGs reading over the weekend:  Sat  12p 353 6p 242 11p 272 Sun 12p 228 6p 247 11p 178 Initial call taken by: Margaret Pyle, CMA,  June 20, 2010 1:34 PM  Follow-up for Phone Call        unfort, his BS are not dangerous but not good enough yet;  please start januvia 50 mg,  cont to monitor blood sugars and call this Thursday sept 1 with reading;    Should CONT all other meds   Follow-up by: Corwin Levins MD,  June 20, 2010 1:38 PM  Additional Follow-up for Phone Call Additional follow up Details #1::        pt informed and will keep a record of CBGs to bring to OV on Tue Sept 6th Additional Follow-up by: Margaret Pyle, CMA,  June 20, 2010 2:05 PM    New/Updated Medications: JANUVIA 50 MG TABS (SITAGLIPTIN PHOSPHATE) 1po once daily Prescriptions: JANUVIA 50 MG TABS (SITAGLIPTIN PHOSPHATE) 1po once daily  #30 x 11   Entered and Authorized by:   Corwin Levins MD   Signed by:   Corwin Levins MD on 06/20/2010   Method used:   Electronically to        [facility] [address] #[id number]* (retail)       [address] [place], [place]  [address]       Ph: [phone]       Fax: [phone]   RxID:   [id number]

## 2010-11-22 NOTE — Miscellaneous (Signed)
Summary: Order ONO/faxed to [facility]  Order ONO/faxed to [facility]   Imported By: Lester  11/11/2009 10:19:30  _____________________________________________________________________  External Attachment:    Type:   Image     Comment:   External Document

## 2010-11-22 NOTE — Progress Notes (Signed)
  Phone Note Outgoing Call   Call placed by: Robin Summary of Call: Called patient at his home to inform to go to ER immediately as received his Glucose lab report of 840 infomed MD stated dangerously high and go immediately, patient agreed to do so. Called [facility] and spoke to Triage RN Thurston Hole Stat to inform patient on his way, will fax todays office notes, medication list and labs from this morning to [phone]. Initial call taken by: Robin Ewing CMA Duncan Dull),  June 14, 2010 4:38 PM

## 2010-11-22 NOTE — Progress Notes (Signed)
Summary: CBG  Phone Note Call from Patient Call back at Home Phone [phone]   Caller: Patient Summary of Call: Pt called to report his CBGs, lowest 62 and highest 136.  Pt requested refill of ProAir:  Verbal given to pharmacist to approve 6 total. Original Rx in July not recieved. Initial call taken by: Margaret Pyle, CMA,  July 15, 2010 1:59 PM  Follow-up for Phone Call        ok to further decrease the glimeparide 2 mg down to HALF pill in the AM only Follow-up by: Margaret Pyle, CMA,  July 15, 2010 2:04 PM  Additional Follow-up for Phone Call Additional follow up Details #1::        Pt informed of decrease and Rx  Additional Follow-up by: Margaret Pyle, CMA,  July 15, 2010 2:27 PM    New/Updated Medications: GLIMEPIRIDE 2 MG TABS (GLIMEPIRIDE) 1/2 by mouth once daily

## 2010-11-22 NOTE — Progress Notes (Signed)
Summary: CBG  Phone Note Call from Patient Call back at Home Phone [phone]   Caller: Patient Summary of Call: Pt called with CBG since OV.  Wed: 339 12p 277 6p 350 11p   Thur 253 12p 240 6p 307p   Fri 239  12p Initial call taken by: Margaret Pyle, CMA,  June 17, 2010 1:43 PM  Follow-up for Phone Call        please ask pt his current med regimen for DM, including any pills or insulin Follow-up by: Corwin Levins MD,  June 17, 2010 1:59 PM  Additional Follow-up for Phone Call Additional follow up Details #1::        Metformin 1 500mg  tab & Glimeparide 1 4mg  tab after breakfast, Metformin 1 500mg  tab & Glimperide 1 4mg   tab after evening meal. Pt states that he notices CBG drop after exercising but he remains extremely thirst most of the time Additional Follow-up by: Margaret Pyle, CMA,  June 17, 2010 2:10 PM    Additional Follow-up for Phone Call Additional follow up Details #2::    ok to incr the metformin to 1000 two times a day  (watch for any diarrhea);  check cbg's and call on Monday Follow-up by: Corwin Levins MD,  June 17, 2010 2:22 PM  Additional Follow-up for Phone Call Additional follow up Details #3:: Details for Additional Follow-up Action Taken: Pt informed and will monitor CBG and call with reading Monday Additional Follow-up by: Margaret Pyle, CMA,  June 17, 2010 2:40 PM  New/Updated Medications: METFORMIN HCL 1000 MG TABS (METFORMIN HCL) 1po two times a day Prescriptions: METFORMIN HCL 1000 MG TABS (METFORMIN HCL) 1po two times a day  #60 x 11   Entered and Authorized by:   Corwin Levins MD   Signed by:   Corwin Levins MD on 06/17/2010   Method used:   Electronically to        [facility] [address] #[id number]* (retail)       [address] [place], [place]  [address]       Ph: [phone]       Fax: [phone]   RxID:   [id number]

## 2010-11-22 NOTE — Progress Notes (Signed)
Summary: Cold sxs  Phone Note Call from Patient Call back at Home Phone [phone]   Caller: Patient Summary of Call: Pt called stating that he is very sick, productive cough, ST and he is loosing his voice. Pt is requesting and Rx to his pharmacy. Please advise Initial call taken by: Margaret Pyle, CMA,  June 30, 2010 8:45 AM  Follow-up for Phone Call        ok for zpack - done per emr Follow-up by: Corwin Levins MD,  June 30, 2010 1:11 PM  Additional Follow-up for Phone Call Additional follow up Details #1::        Pt informed Additional Follow-up by: Margaret Pyle, CMA,  June 30, 2010 1:40 PM    New/Updated Medications: AZITHROMYCIN 250 MG TABS (AZITHROMYCIN) 2po qd for 1 day, then 1po qd for 4days, then stop Prescriptions: AZITHROMYCIN 250 MG TABS (AZITHROMYCIN) 2po qd for 1 day, then 1po qd for 4days, then stop  #6 x 1   Entered and Authorized by:   Corwin Levins MD   Signed by:   Corwin Levins MD on 06/30/2010   Method used:   Electronically to        [facility] [id number]* (retail)       [address] [place], [place]  [address]       Ph: [phone]       Fax: [phone]   RxID:   [id number]

## 2010-11-22 NOTE — Progress Notes (Signed)
Summary: REFILL  Phone Note From Pharmacy   Caller: [facility] [phone] Summary of Call: [facility] called req refill of duoneb.  Initial call taken by: Lamar Sprinkles, CMA,  February 10, 2010 2:23 PM  Follow-up for Phone Call        done hardcopy to LIM side B - dahlia  Follow-up by: Corwin Levins MD,  February 10, 2010 3:26 PM  Additional Follow-up for Phone Call Additional follow up Details #1::        RX faxed to [facility]  Additional Follow-up by: Margaret Pyle, CMA,  February 10, 2010 3:39 PM    New/Updated Medications: DUONEB 0.5-2.5 (3) MG/3ML SOLN (IPRATROPIUM-ALBUTEROL) use asd qid as needed Prescriptions: DUONEB 0.5-2.5 (3) MG/3ML SOLN (IPRATROPIUM-ALBUTEROL) use asd qid as needed  #90 x 3   Entered and Authorized by:   Corwin Levins MD   Signed by:   Corwin Levins MD on 02/10/2010   Method used:   Print then Give to Patient   RxID:   [id number]

## 2010-11-22 NOTE — Progress Notes (Signed)
Summary: Zolpidem PA  Phone Note From Pharmacy   Summary of Call: PA request--Zolpidem. Form completed. Initial call taken by: Lucious Groves,  April 06, 2010 9:30 AM  Follow-up for Phone Call        Forms faxed to [facility] Follow-up by: Margaret Pyle, CMA,  April 07, 2010 8:20 AM     Appended Document: Zolpidem PA Called to check on status and was notified that they had not received the fax form. PA was completed via phone and they will contact the office with decision via fax.

## 2010-11-22 NOTE — Progress Notes (Signed)
Summary: Zolpidem denied  Phone Note Other Incoming   Summary of Call: Zolpidem prior authorization has been denied stating that the patient must try preferred med--Lunesta. Please advise. Initial call taken by: Lucious Groves,  April 11, 2010 2:24 PM  Follow-up for Phone Call        ok for lunesta  2mg  - 1 at bedtime as needed   done hardcopy to LIM side B - dahlia  Follow-up by: Corwin Levins MD,  April 11, 2010 5:41 PM  Additional Follow-up for Phone Call Additional follow up Details #1::        pt informed, Rx faxed to pahrmacy per pt request Additional Follow-up by: Margaret Pyle, CMA,  April 12, 2010 8:17 AM    New/Updated Medications: LUNESTA 2 MG TABS (ESZOPICLONE) 1 by mouth at bedtime as needed sleep Prescriptions: LUNESTA 2 MG TABS (ESZOPICLONE) 1 by mouth at bedtime as needed sleep  #30 x 2   Entered and Authorized by:   Corwin Levins MD   Signed by:   Corwin Levins MD on 04/11/2010   Method used:   Print then Give to Patient   RxID:   [id number]

## 2010-11-22 NOTE — Progress Notes (Signed)
Summary: Rx req  Phone Note Call from Patient Call back at Home Phone [phone]   Caller: Patient Summary of Call: Pt called stating that he recently pulled a muscle in his back while exercising. Pt is requesting Rx for muscle relaxer. Pt states that he does not want to use Hydrocodone for muscle pain.  [facility] [address] Initial call taken by: Margaret Pyle, CMA,  April 13, 2010 11:12 AM  Follow-up for Phone Call        done escript Follow-up by: Corwin Levins MD,  April 13, 2010 12:57 PM  Additional Follow-up for Phone Call Additional follow up Details #1::        pt informed Additional Follow-up by: Margaret Pyle, CMA,  April 13, 2010 1:06 PM    New/Updated Medications: FLEXERIL 5 MG TABS (CYCLOBENZAPRINE HCL) 1po three times a day as needed Prescriptions: FLEXERIL 5 MG TABS (CYCLOBENZAPRINE HCL) 1po three times a day as needed  #60 x 1   Entered and Authorized by:   Corwin Levins MD   Signed by:   Corwin Levins MD on 04/13/2010   Method used:   Electronically to        [facility] [address] [id number]* (retail)       [address] [place], [place]  [address]       Ph: [phone]       Fax: [phone]   RxID:   [id number]
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
## 2010-11-22 NOTE — Medication Information (Signed)
Summary: [facility]  [facility]   Imported By: Sherian Rein 03/02/2010 13:38:33  _____________________________________________________________________  External Attachment:    Type:   Image     Comment:   External Document

## 2010-11-22 NOTE — Progress Notes (Signed)
Summary: PA Singulair  Phone Note Call from Patient Call back at Home Phone [phone]   Caller: Patient Summary of Call: Pt called stating that he was advised by pharmacy that PA is needed on Singulair. Pt says he will be out of medication 11/02. Form can be requested from [employer] or pharmacy, [facility] [address] [phone] Initial call taken by: Margaret Pyle, CMA,  August 22, 2010 2:47 PM  Follow-up for Phone Call        Pt picked up medication on 08/22/10. Follow-up by: Dagoberto Reef,  August 25, 2010 3:27 PM

## 2010-11-22 NOTE — Assessment & Plan Note (Signed)
Summary: BLOOD SUGAR PROBLEM--STC   Vital Signs:  Patient profile:   55 year old male Height:      69 inches Weight:      217.38 pounds BMI:     32.22 O2 Sat:      94 % on Room air Temp:     97.3 degrees F oral Pulse rate:   90 / minute BP sitting:   106 / 64  (left arm) Cuff size:   large  Vitals Entered By: Zella Ball Ewing CMA Duncan Dull) (June 14, 2010 10:19 AM)  O2 Flow:  Room air  CC: BS elevated, chest congestion, no appetite, thirsty/RE   Primary Care Provider:  Corwin Levins MD  CC:  BS elevated, chest congestion, no appetite, and thirsty/RE.  History of Present Illness: Here with c/o marked polydipsia and polyuria for ? reason ;  drinking copious fluids and urinating as well;  last a1c was 7.7  in may 2011; and he is somewhat evasive, but appears he has not been taking the metformin as he thought for some reason it was causing nausea (metformin "regular" was changed to the ER version but not clear if this caused the nausea);  thinks he has lost about 20 lbs in the past 2 wks;  had some blurry vision, saw optho who suggested he had blurriness due to his sugar.   CBG in the office reads "high"  tx with 8 units lantus, and 10 units humalog .  Not checking sugars at home as well.  Incidently also with 1 wk onset acute bronchitis symptoms with mild ST, and mild prod cough, not clear color sputum, and Pt denies CP, worsening sob, doe, wheezing, orthopnea, pnd, worsening LE edema, palps, dizziness or syncope  Pt denies new neuro symptoms such as headache, facial or extremity weakness  No fever, wt loss, night sweats, loss of appetite or other constitutional symptoms except for the above.    Problems Prior to Update: 1)  Bronchitis-acute  (ICD-466.0) 2)  Insomnia-sleep Disorder-unspec  (ICD-780.52) 3)  Peripheral Edema  (ICD-782.3) 4)  Wheezing  (ICD-786.07) 5)  Wheezing  (ICD-786.07) 6)  Preoperative Examination  (ICD-V72.84) 7)  Allergic Rhinitis  (ICD-477.9) 8)  Preventive Health  Care  (ICD-V70.0) 9)  Asthmatic Bronchitis, Acute  (ICD-466.0) 10)  Osteoarthritis, Knees, Bilateral  (ICD-715.96) 11)  Gerd  (ICD-530.81) 12)  Hepatotoxicity, Drug-induced, Risk of  (ICD-V58.69) 13)  Breast Pain, Left  (ICD-611.71) 14)  Hyperlipidemia  (ICD-272.4) 15)  Peripheral Edema  (ICD-782.3) 16)  Diabetes Mellitus, Type II  (ICD-250.00) 17)  Preventive Health Care  (ICD-V70.0) 18)  Asthma, With Acute Exacerbation  (ICD-493.92) 19)  Family History of Cad Male 1st Degree Relative <50  (ICD-V17.3) 20)  Low Back Pain  (ICD-724.2) 21)  Symptom, Abnormal Involuntary Movement Nec  (ICD-781.0) 22)  Depression  (ICD-311) 23)  Asthma  (ICD-493.90) 24)  Anxiety  (ICD-300.00)  Medications Prior to Update: 1)  Depakote 250 Mg  Tbec (Divalproex Sodium) .Marland Kitchen.. 1po Qid 2)  Hydrocodone-Acetaminophen 10-325 Mg  Tabs (Hydrocodone-Acetaminophen) .Marland Kitchen.. 1 By Mouth Q 6 Hrs Prn  As Needed - To Fill March 28, 2010 3)  Spiriva Handihaler 18 Mcg Caps (Tiotropium Bromide Monohydrate) .... One Puff Each Day 4)  Singulair 10 Mg Tabs (Montelukast Sodium) .... Take 1 Tablet By Mouth Once A Day 5)  Adult Aspirin Ec Low Strength 81 Mg Tbec (Aspirin) .Marland Kitchen.. 1 By Mouth Once Daily 6)  Metformin Hcl 500 Mg Xr24h-Tab (Metformin Hcl) .... 3 By Mouth Qam  7)  Simvastatin 40 Mg Tabs (Simvastatin) .Marland Kitchen.. 1 By Mouth Once Daily 8)  Cetirizine Hcl 10 Mg Tabs (Cetirizine Hcl) .Marland Kitchen.. 1 By Mouth Once Daily As Needed 9)  Lunesta 2 Mg Tabs (Eszopiclone) .Marland Kitchen.. 1 By Mouth At Bedtime As Needed Sleep 10)  Proair Hfa 108 (90 Base) Mcg/act Aers (Albuterol Sulfate) .... 2 Puffs Four Times Per Day As Needed For Wheezing 11)  Citalopram Hydrobromide 10 Mg Tabs (Citalopram Hydrobromide) .Marland Kitchen.. 1 By Mouth Once Daily 12)  Furosemide 20 Mg Tabs (Furosemide) .Marland Kitchen.. 1 By Mouth Once Daily As Needed Swelling 13)  Duoneb 0.5-2.5 (3) Mg/85ml Soln (Ipratropium-Albuterol) .... Use Asd Qid As Needed 14)  Flexeril 5 Mg Tabs (Cyclobenzaprine Hcl) .Marland Kitchen.. 1po Three Times  A Day As Needed  Current Medications (verified): 1)  Depakote 250 Mg  Tbec (Divalproex Sodium) .Marland Kitchen.. 1po Qid 2)  Hydrocodone-Acetaminophen 10-325 Mg  Tabs (Hydrocodone-Acetaminophen) .Marland Kitchen.. 1 By Mouth Q 6 Hrs Prn  As Needed - To Fill March 28, 2010 3)  Spiriva Handihaler 18 Mcg Caps (Tiotropium Bromide Monohydrate) .... One Puff Each Day 4)  Singulair 10 Mg Tabs (Montelukast Sodium) .... Take 1 Tablet By Mouth Once A Day 5)  Adult Aspirin Ec Low Strength 81 Mg Tbec (Aspirin) .Marland Kitchen.. 1 By Mouth Once Daily 6)  Metformin Hcl 500 Mg Tabs (Metformin Hcl) .Marland Kitchen.. 1po Two Times A Day 7)  Simvastatin 40 Mg Tabs (Simvastatin) .Marland Kitchen.. 1 By Mouth Once Daily 8)  Cetirizine Hcl 10 Mg Tabs (Cetirizine Hcl) .Marland Kitchen.. 1 By Mouth Once Daily As Needed 9)  Lunesta 2 Mg Tabs (Eszopiclone) .Marland Kitchen.. 1 By Mouth At Bedtime As Needed Sleep 10)  Proair Hfa 108 (90 Base) Mcg/act Aers (Albuterol Sulfate) .... 2 Puffs Four Times Per Day As Needed For Wheezing 11)  Citalopram Hydrobromide 10 Mg Tabs (Citalopram Hydrobromide) .Marland Kitchen.. 1 By Mouth Once Daily 12)  Furosemide 20 Mg Tabs (Furosemide) .Marland Kitchen.. 1 By Mouth Once Daily As Needed Swelling 13)  Duoneb 0.5-2.5 (3) Mg/11ml Soln (Ipratropium-Albuterol) .... Use Asd Qid As Needed 14)  Flexeril 5 Mg Tabs (Cyclobenzaprine Hcl) .Marland Kitchen.. 1po Three Times A Day As Needed 15)  Glimepiride 4 Mg Tabs (Glimepiride) .Marland Kitchen.. 1po Two Times A Day 16)  Cephalexin 500 Mg Caps (Cephalexin) .Marland Kitchen.. 1 Po Three Times A Day 17)  Onetouch Ultra Test  Strp (Glucose Blood) .... Use Asd Two Times A Day  250.02 18)  Lancets  Misc (Lancets) .... Use Asd Two Times A Day  - 250.02  Allergies (verified): 1)  ! * Iv Dye  Past History:  Past Medical History: Last updated: 03/18/2009 Anxiety Asthma Depression - disabled H/O Facial Tic/Tremor Low back pain - chronic Knee pain - chronic - s/p 9 surguries, disabled facial tic Diabetes mellitus, type II - diet Hyperlipidemia GERD Allergic rhinitis  Past Surgical History: Last  updated: 06/21/2007 R Knee Surgery X9 Rotator cuff repair- bilateral Inguinal herniorrhaphy Appendectomy Tonsillectomy Cholecystectomy  Family History: Last updated: 06/21/2007 Family History of CAD Male 1st degree relative <50  Social History: Last updated: 03/28/2010 Current Smoker Alcohol use-yes Divorced 1 son - lives with im 1 daughter - lives with ex-wife Drug use-no  Risk Factors: Smoking Status: current (06/21/2007) Packs/Day: 1 PPD (06/21/2007)  Review of Systems       all otherwise negative per pt -    Physical Exam  General:  alert and overweight-appearing.  , mild ill appaering Head:  normocephalic and atraumatic.   Eyes:  vision grossly intact, pupils equal, and pupils round.  Ears:  R ear normal and L ear normal.   Nose:  nasal dischargemucosal pallor and mucosal edema.   Mouth:  pharyngeal erythema and fair dentition.   Neck:  supple and no masses.   Lungs:  normal respiratory effort and normal breath sounds.   Heart:  normal rate and regular rhythm.   Abdomen:  soft, non-tender, and normal bowel sounds.   Extremities:  no edema, no erythema    Impression & Recommendations:  Problem # 1:  BRONCHITIS-ACUTE (ICD-466.0)  His updated medication list for this problem includes:    Spiriva Handihaler 18 Mcg Caps (Tiotropium bromide monohydrate) ..... One puff each day    Singulair 10 Mg Tabs (Montelukast sodium) .Marland Kitchen... Take 1 tablet by mouth once a day    Proair Hfa 108 (90 Base) Mcg/act Aers (Albuterol sulfate) .Marland Kitchen... 2 puffs four times per day as needed for wheezing    Duoneb 0.5-2.5 (3) Mg/68ml Soln (Ipratropium-albuterol) ..... Use asd qid as needed    Cephalexin 500 Mg Caps (Cephalexin) .Marland Kitchen... 1 po three times a day  Orders: T-2 View CXR, Same Day (71020.5TC) treat as above, f/u any worsening signs or symptoms , cant r/o pna - for cxr as well  Problem # 2:  DIABETES MELLITUS, TYPE II (ICD-250.00)  His updated medication list for this problem  includes:    Adult Aspirin Ec Low Strength 81 Mg Tbec (Aspirin) .Marland Kitchen... 1 by mouth once daily    Metformin Hcl 500 Mg Tabs (Metformin hcl) .Marland Kitchen... 1po two times a day    Glimepiride 4 Mg Tabs (Glimepiride) .Marland Kitchen... 1po two times a day  Orders: TLB-BMP (Basic Metabolic Panel-BMET) (80048-METABOL) TLB-A1C / Hgb A1C (Glycohemoglobin) (83036-A1C) by hx and cbg is uncontrolled, 8 units lantus and 10 units regular given approx noon today, Pt to cont DM diet, excercise, wt loss efforts; to check labs today ;  metformin rx decreased to two times a day (regular, not the ER version per pt preference), and glimeparide added; gave glucometer and to check cbg's, call with results  I suspect overall out of control a combination of noncompliance with meds and ? diet, as well as acute illness  Problem # 3:  NAUSEA (ICD-787.02) exam benign, suspect related to elev sugar;  to follow for now, no vomiting   Problem # 4:  ASTHMA (ICD-493.90)  His updated medication list for this problem includes:    Spiriva Handihaler 18 Mcg Caps (Tiotropium bromide monohydrate) ..... One puff each day    Singulair 10 Mg Tabs (Montelukast sodium) .Marland Kitchen... Take 1 tablet by mouth once a day    Proair Hfa 108 (90 Base) Mcg/act Aers (Albuterol sulfate) .Marland Kitchen... 2 puffs four times per day as needed for wheezing    Duoneb 0.5-2.5 (3) Mg/51ml Soln (Ipratropium-albuterol) ..... Use asd qid as needed o/w stable  - Continue all previous medications as before this visit   Complete Medication List: 1)  Depakote 250 Mg Tbec (Divalproex sodium) .Marland Kitchen.. 1po qid 2)  Hydrocodone-acetaminophen 10-325 Mg Tabs (Hydrocodone-acetaminophen) .Marland Kitchen.. 1 by mouth q 6 hrs prn  as needed - to fill March 28, 2010 3)  Spiriva Handihaler 18 Mcg Caps (Tiotropium bromide monohydrate) .... One puff each day 4)  Singulair 10 Mg Tabs (Montelukast sodium) .... Take 1 tablet by mouth once a day 5)  Adult Aspirin Ec Low Strength 81 Mg Tbec (Aspirin) .Marland Kitchen.. 1 by mouth once daily 6)   Metformin Hcl 500 Mg Tabs (Metformin hcl) .Marland Kitchen.. 1po two times a day 7)  Simvastatin 40 Mg Tabs (Simvastatin) .Marland Kitchen.. 1 by mouth once daily 8)  Cetirizine Hcl 10 Mg Tabs (Cetirizine hcl) .Marland Kitchen.. 1 by mouth once daily as needed 9)  Lunesta 2 Mg Tabs (Eszopiclone) .Marland Kitchen.. 1 by mouth at bedtime as needed sleep 10)  Proair Hfa 108 (90 Base) Mcg/act Aers (Albuterol sulfate) .... 2 puffs four times per day as needed for wheezing 11)  Citalopram Hydrobromide 10 Mg Tabs (Citalopram hydrobromide) .Marland Kitchen.. 1 by mouth once daily 12)  Furosemide 20 Mg Tabs (Furosemide) .Marland Kitchen.. 1 by mouth once daily as needed swelling 13)  Duoneb 0.5-2.5 (3) Mg/108ml Soln (Ipratropium-albuterol) .... Use asd qid as needed 14)  Flexeril 5 Mg Tabs (Cyclobenzaprine hcl) .Marland Kitchen.. 1po three times a day as needed 15)  Glimepiride 4 Mg Tabs (Glimepiride) .Marland Kitchen.. 1po two times a day 16)  Cephalexin 500 Mg Caps (Cephalexin) .Marland Kitchen.. 1 po three times a day 17)  Onetouch Ultra Test Strp (Glucose blood) .... Use asd two times a day  250.02 18)  Lancets Misc (Lancets) .... Use asd two times a day  - 250.02  Patient Instructions: 1)  start the metformin 500 mg two times a day (the regular kind, not the ER) for blood sugar 2)  start the glimeparide 4 mg two times a day (for blood sugar) 3)  start the cephalexin antibiotic as prescribed  4)  you were given 8 units of Lantus insulin today 5)  check your sugars at least two times a day, before meals, and call with results on thursday Aug 25  [phone] 6)  Please also call if your sugar seems to go too low (such as less than 90 to 100 , especially if you have symptoms of shakiness, weakness, hunger)   -   Keep hard candy in your pocket in case this happens 7)  Please to go ER if you are unable to get your sugars down under 500, as this can get worse and life-threatening 8)  Please take all new medications as prescribed - the antibiotic 9)  Please go to Radiology in the basement level for your X-Ray today  10)  Continue  all other previous medications as before this visit  11)  Please schedule a follow-up appointment in 2 weeks. Prescriptions: ONETOUCH ULTRA TEST  STRP (GLUCOSE BLOOD) use asd two times a day  250.02  #200 x 11   Entered and Authorized by:   Corwin Levins MD   Signed by:   Corwin Levins MD on 06/14/2010   Method used:   Print then Give to Patient   RxID:   [id number] LANCETS  MISC (LANCETS) use asd two times a day  - 250.02  #200 x 11   Entered and Authorized by:   Corwin Levins MD   Signed by:   Corwin Levins MD on 06/14/2010   Method used:   Print then Give to Patient   RxID:   [id number] [facility] ULTRA TEST  STRP (GLUCOSE BLOOD) use asd two times a day  #200 x 11   Entered and Authorized by:   Corwin Levins MD   Signed by:   Corwin Levins MD on 06/14/2010   Method used:   Print then Give to Patient   RxID:   [id number] CEPHALEXIN 500 MG CAPS (CEPHALEXIN) 1 po three times a day  #30 x 0   Entered and Authorized by:   Corwin Levins MD   Signed by:   Corwin Levins MD  on 06/14/2010   Method used:   Print then Give to Patient   RxID:   [id number] GLIMEPIRIDE 4 MG TABS (GLIMEPIRIDE) 1po two times a day  #180 x 3   Entered and Authorized by:   Corwin Levins MD   Signed by:   Corwin Levins MD on 06/14/2010   Method used:   Print then Give to Patient   RxID:   [id number] METFORMIN HCL 500 MG TABS (METFORMIN HCL) 1po two times a day  #180 x 3   Entered and Authorized by:   Corwin Levins MD   Signed by:   Corwin Levins MD on 06/14/2010   Method used:   Print then Give to Patient   RxID:   [id number]

## 2010-11-22 NOTE — Procedures (Signed)
Summary: Pulse Oximetry/[employer]  Pulse Oximetry/[employer]   Imported By: Sherian Rein 12/21/2009 07:41:15  _____________________________________________________________________  External Attachment:    Type:   Image     Comment:   External Document

## 2010-11-22 NOTE — Assessment & Plan Note (Signed)
Summary: 6 MO ROV /NWS  #   Vital Signs:  Patient profile:   55 year old male Height:      68 inches Weight:      234.25 pounds BMI:     35.75 O2 Sat:      94 % on Room air Temp:     98.3 degrees F oral Pulse rate:   90 / minute BP sitting:   100 / 60  (left arm) Cuff size:   large  Vitals Entered By: Zella Ball Ewing CMA Duncan Dull) (September 27, 2010 1:30 PM)  O2 Flow:  Room air  Preventive Care Screening  Last Flu Shot:    Date:  08/23/2010    Results:  given      declines colonoscopy - defer to next yr  CC: 6 month ROV/RE   Primary Care Provider:  Corwin Levins MD  CC:  6 month ROV/RE.  History of Present Illness: here for wellness and f/u;  overall doing well;  Pt denies CP, worsening sob, doe, wheezing, orthopnea, pnd, worsening LE edema, palps, dizziness or syncope  Pt denies new neuro symptoms such as headache, facial or extremity weakness  Pt denies polydipsia, polyuria, or low sugar symptoms such as shakiness improved with eating. except for low BS twice per month tx with hard candy  Overall good compliance with meds, trying to follow low chol, DM diet, wt stable, little excercise however No fever, wt loss, night sweats, loss of appetite or other constitutional symptoms  Denies worsening depressive symptoms, suicidal ideation, or panic.   Overall good compliance with meds, and good tolerability.  Pt states good ability with ADL's, low fall risk, home safety reviewed and adequate, no significant change in hearing or vision, trying to follow lower chol diet, and occasionally active only with regular excercise.  Would like alternative to lunesta as it and [place] have not been effective.     Problems Prior to Update: 1)  Nausea  (ICD-787.02) 2)  Insomnia-sleep Disorder-unspec  (ICD-780.52) 3)  Peripheral Edema  (ICD-782.3) 4)  Wheezing  (ICD-786.07) 5)  Wheezing  (ICD-786.07) 6)  Preoperative Examination  (ICD-V72.84) 7)  Allergic Rhinitis  (ICD-477.9) 8)  Preventive Health  Care  (ICD-V70.0) 9)  Asthmatic Bronchitis, Acute  (ICD-466.0) 10)  Osteoarthritis, Knees, Bilateral  (ICD-715.96) 11)  Gerd  (ICD-530.81) 12)  Hepatotoxicity, Drug-induced, Risk of  (ICD-V58.69) 13)  Breast Pain, Left  (ICD-611.71) 14)  Hyperlipidemia  (ICD-272.4) 15)  Peripheral Edema  (ICD-782.3) 16)  Diabetes Mellitus, Type II  (ICD-250.00) 17)  Preventive Health Care  (ICD-V70.0) 18)  Asthma, With Acute Exacerbation  (ICD-493.92) 19)  Family History of Cad Male 1st Degree Relative <50  (ICD-V17.3) 20)  Low Back Pain  (ICD-724.2) 21)  Symptom, Abnormal Involuntary Movement Nec  (ICD-781.0) 22)  Depression  (ICD-311) 23)  Asthma  (ICD-493.90) 24)  Anxiety  (ICD-300.00)  Medications Prior to Update: 1)  Depakote 250 Mg  Tbec (Divalproex Sodium) .Marland Kitchen.. 1po Qid 2)  Hydrocodone-Acetaminophen 10-325 Mg  Tabs (Hydrocodone-Acetaminophen) .Marland Kitchen.. 1 By Mouth Q 6 Hrs Prn  As Needed - To Fill March 28, 2010 3)  Spiriva Handihaler 18 Mcg Caps (Tiotropium Bromide Monohydrate) .... One Puff Each Day 4)  Singulair 10 Mg Tabs (Montelukast Sodium) .... Take 1 Tablet By Mouth Once A Day 5)  Adult Aspirin Ec Low Strength 81 Mg Tbec (Aspirin) .Marland Kitchen.. 1 By Mouth Once Daily 6)  Metformin Hcl 1000 Mg Tabs (Metformin Hcl) .Marland Kitchen.. 1po Two Times A Day  7)  Simvastatin 40 Mg Tabs (Simvastatin) .Marland Kitchen.. 1 By Mouth Once Daily 8)  Cetirizine Hcl 10 Mg Tabs (Cetirizine Hcl) .Marland Kitchen.. 1 By Mouth Once Daily As Needed 9)  Lunesta 2 Mg Tabs (Eszopiclone) .Marland Kitchen.. 1 By Mouth At Bedtime As Needed Sleep 10)  Proair Hfa 108 (90 Base) Mcg/act Aers (Albuterol Sulfate) .... 2 Puffs Four Times Per Day As Needed For Wheezing 11)  Citalopram Hydrobromide 10 Mg Tabs (Citalopram Hydrobromide) .Marland Kitchen.. 1 By Mouth Once Daily 12)  Furosemide 20 Mg Tabs (Furosemide) .Marland Kitchen.. 1 By Mouth Once Daily As Needed Swelling 13)  Duoneb 0.5-2.5 (3) Mg/33ml Soln (Ipratropium-Albuterol) .... Use Asd Qid As Needed 14)  Flexeril 5 Mg Tabs (Cyclobenzaprine Hcl) .Marland Kitchen.. 1po Three Times  A Day As Needed 15)  Glimepiride 2 Mg Tabs (Glimepiride) .... 1/2 By Mouth Once Daily 16)  Onetouch Ultra Test  Strp (Glucose Blood) .... Use Asd Three Times A Day  - 250.02 17)  Lancets  Misc (Lancets) .... Use Asd Two Times A Day  - 250.02 18)  Januvia 50 Mg Tabs (Sitagliptin Phosphate) .Marland Kitchen.. 1po Once Daily 19)  Doxycycline Hyclate 100 Mg Caps (Doxycycline Hyclate) .Marland Kitchen.. 1po Two Times A Day  Current Medications (verified): 1)  Depakote 250 Mg  Tbec (Divalproex Sodium) .Marland Kitchen.. 1po Qid 2)  Hydrocodone-Acetaminophen 10-325 Mg  Tabs (Hydrocodone-Acetaminophen) .Marland Kitchen.. 1 By Mouth Q 6 Hrs Prn  As Needed - To Fill Sep 27, 2010 3)  Spiriva Handihaler 18 Mcg Caps (Tiotropium Bromide Monohydrate) .... One Puff Each Day 4)  Singulair 10 Mg Tabs (Montelukast Sodium) .... Take 1 Tablet By Mouth Once A Day 5)  Adult Aspirin Ec Low Strength 81 Mg Tbec (Aspirin) .Marland Kitchen.. 1 By Mouth Once Daily 6)  Simvastatin 40 Mg Tabs (Simvastatin) .Marland Kitchen.. 1 By Mouth Once Daily 7)  Cetirizine Hcl 10 Mg Tabs (Cetirizine Hcl) .Marland Kitchen.. 1 By Mouth Once Daily As Needed 8)  Temazepam 15 Mg Caps (Temazepam) .Marland Kitchen.. 1-2 At Bedtime As Needed Sleep 9)  Proair Hfa 108 (90 Base) Mcg/act Aers (Albuterol Sulfate) .... 2 Puffs Four Times Per Day As Needed For Wheezing 10)  Citalopram Hydrobromide 10 Mg Tabs (Citalopram Hydrobromide) .Marland Kitchen.. 1 By Mouth Once Daily 11)  Furosemide 20 Mg Tabs (Furosemide) .Marland Kitchen.. 1 By Mouth Once Daily As Needed Swelling 12)  Duoneb 0.5-2.5 (3) Mg/68ml Soln (Ipratropium-Albuterol) .... Use Asd Qid As Needed 13)  Flexeril 5 Mg Tabs (Cyclobenzaprine Hcl) .Marland Kitchen.. 1po Three Times A Day As Needed 14)  Onetouch Ultra Test  Strp (Glucose Blood) .... Use Asd Three Times A Day  - 250.02 15)  Lancets  Misc (Lancets) .... Use Asd Two Times A Day  - 250.02 16)  Janumet 50-1000 Mg Tabs (Sitagliptin-Metformin Hcl) .Marland Kitchen.. 1 By Mouth Two Times A Day  Allergies (verified): 1)  ! * Iv Dye  Past History:  Past Medical History: Last updated:  03/18/2009 Anxiety Asthma Depression - disabled H/O Facial Tic/Tremor Low back pain - chronic Knee pain - chronic - s/p 9 surguries, disabled facial tic Diabetes mellitus, type II - diet Hyperlipidemia GERD Allergic rhinitis  Past Surgical History: Last updated: 06/21/2007 R Knee Surgery X9 Rotator cuff repair- bilateral Inguinal herniorrhaphy Appendectomy Tonsillectomy Cholecystectomy  Family History: Last updated: 06/21/2007 Family History of CAD Male 1st degree relative <50  Social History: Last updated: 03/28/2010 Current Smoker Alcohol use-yes Divorced 1 son - lives with im 1 daughter - lives with ex-wife Drug use-no  Risk Factors: Smoking Status: current (06/21/2007) Packs/Day: 1 PPD (06/21/2007)  Review  of Systems  The patient denies anorexia, fever, vision loss, decreased hearing, hoarseness, chest pain, syncope, dyspnea on exertion, peripheral edema, prolonged cough, headaches, hemoptysis, abdominal pain, melena, hematochezia, severe indigestion/heartburn, hematuria, muscle weakness, suspicious skin lesions, transient blindness, difficulty walking, depression, unusual weight change, abnormal bleeding, enlarged lymph nodes, and angioedema.         all otherwise negative per pt -    Physical Exam  General:  alert and overweight-appearing.   Head:  normocephalic and atraumatic.   Eyes:  vision grossly intact, pupils equal, and pupils round.   Ears:  R ear normal and L ear normal.   Nose:  no external deformity and no nasal discharge.   Mouth:  no gingival abnormalities and pharynx pink and moist.   Neck:  supple and no masses.   Lungs:  normal respiratory effort and normal breath sounds.   Heart:  normal rate and regular rhythm.   Abdomen:  soft, non-tender, and normal bowel sounds.   Msk:  bialt knee crepitus without effusion or tenderness Extremities:  no edema, no erythema  Neurologic:  cranial nerves II-XII intact and strength normal in all  extremities.   Skin:  color normal and no rashes.   Psych:  not anxious appearing and not depressed appearing.     Impression & Recommendations:  Problem # 1:  Preventive Health Care (ICD-V70.0) Overall doing well, age appropriate education and counseling updated, referral for preventive services and immunizations addressed, dietary counseling and smoking status adressed , most recent labs reviewed, ecg reviewed I have personally reviewed and have noted 1.The patient's medical and social history 2.Their use of alcohol, tobacco or illicit drugs 3.Their current medications and supplements 4. Functional ability including ADL's, fall risk, home safety risk, hearing & visual impairment  5.Diet and physical activities 6.Evidence for depression or mood disorders The patients weight, height, BMI  have been recorded in the chart I have made referrals, counseling and provided education to the patient based review of the above   Problem # 2:  DIABETES MELLITUS, TYPE II (ICD-250.00)  The following medications were removed from the medication list:    Metformin Hcl 1000 Mg Tabs (Metformin hcl) .Marland Kitchen... 1po two times a day    Glimepiride 2 Mg Tabs (Glimepiride) .Marland Kitchen... 1/2 by mouth once daily His updated medication list for this problem includes:    Adult Aspirin Ec Low Strength 81 Mg Tbec (Aspirin) .Marland Kitchen... 1 by mouth once daily    Janumet 50-1000 Mg Tabs (Sitagliptin-metformin hcl) .Marland Kitchen... 1 by mouth two times a day stable overall by hx and exam, ok to continue meds/tx as is, will try to simplify his regimen with change to janumet  Labs Reviewed: Creat: 1.0 (09/23/2010)    Reviewed HgBA1c results: 6.8 (09/23/2010)  13.4 (06/14/2010)  Problem # 3:  INSOMNIA-SLEEP DISORDER-UNSPEC (ICD-780.52)  His updated medication list for this problem includes:    Temazepam 15 Mg Caps (Temazepam) .Marland Kitchen... 1-2 at bedtime as needed sleep lunetsa and ambien no help - still wakes up middle of night;  for temazepam at  bedtime as needed   Complete Medication List: 1)  Depakote 250 Mg Tbec (Divalproex sodium) .Marland Kitchen.. 1po qid 2)  Hydrocodone-acetaminophen 10-325 Mg Tabs (Hydrocodone-acetaminophen) .Marland Kitchen.. 1 by mouth q 6 hrs prn  as needed - to fill Sep 27, 2010 3)  Spiriva Handihaler 18 Mcg Caps (Tiotropium bromide monohydrate) .... One puff each day 4)  Singulair 10 Mg Tabs (Montelukast sodium) .... Take 1 tablet by mouth once a day  5)  Adult Aspirin Ec Low Strength 81 Mg Tbec (Aspirin) .Marland Kitchen.. 1 by mouth once daily 6)  Simvastatin 40 Mg Tabs (Simvastatin) .Marland Kitchen.. 1 by mouth once daily 7)  Cetirizine Hcl 10 Mg Tabs (Cetirizine hcl) .Marland Kitchen.. 1 by mouth once daily as needed 8)  Temazepam 15 Mg Caps (Temazepam) .Marland Kitchen.. 1-2 at bedtime as needed sleep 9)  Proair Hfa 108 (90 Base) Mcg/act Aers (Albuterol sulfate) .... 2 puffs four times per day as needed for wheezing 10)  Citalopram Hydrobromide 10 Mg Tabs (Citalopram hydrobromide) .Marland Kitchen.. 1 by mouth once daily 11)  Furosemide 20 Mg Tabs (Furosemide) .Marland Kitchen.. 1 by mouth once daily as needed swelling 12)  Duoneb 0.5-2.5 (3) Mg/37ml Soln (Ipratropium-albuterol) .... Use asd qid as needed 13)  Flexeril 5 Mg Tabs (Cyclobenzaprine hcl) .Marland Kitchen.. 1po three times a day as needed 14)  Onetouch Ultra Test Strp (Glucose blood) .... Use asd three times a day  - 250.02 15)  Lancets Misc (Lancets) .... Use asd two times a day  - 250.02 16)  Janumet 50-1000 Mg Tabs (Sitagliptin-metformin hcl) .Marland Kitchen.. 1 by mouth two times a day  Patient Instructions: 1)  stop the metformin 2)  stop the januvia 50 mg 3)  stop the glimeparide 4)  start the janumet 50/1000 two times a day  5)  Continue all previous medications as before this visit 6)  Please schedule a follow-up appointment in 6 months with: 7)  BMP prior to visit, ICD-9: 250.02 8)  Lipid Panel prior to visit, ICD-9: 9)  HbgA1C prior to visit, ICD-9: Prescriptions: HYDROCODONE-ACETAMINOPHEN 10-325 MG  TABS (HYDROCODONE-ACETAMINOPHEN) 1 by mouth q 6 hrs prn  as  needed - to fill Sep 27, 2010  #120 x 5   Entered and Authorized by:   Corwin Levins MD   Signed by:   Corwin Levins MD on 09/27/2010   Method used:   Print then Give to Patient   RxID:   [id number] TEMAZEPAM 15 MG CAPS (TEMAZEPAM) 1-2 at bedtime as needed sleep  #60 x 2   Entered and Authorized by:   Corwin Levins MD   Signed by:   Corwin Levins MD on 09/27/2010   Method used:   Print then Give to Patient   RxID:   [id number] JANUMET 50-1000 MG TABS (SITAGLIPTIN-METFORMIN HCL) 1 by mouth two times a day  #180 x 3   Entered and Authorized by:   Corwin Levins MD   Signed by:   Corwin Levins MD on 09/27/2010   Method used:   Electronically to        [facility] [address] #[id number]* (retail)       [address] [place], [place]  [address]       Ph: [phone]       Fax: [phone]   RxID:   [id number]    Orders Added: 1)  Est. Patient 40-64 years [id number]

## 2010-11-22 NOTE — Progress Notes (Signed)
Summary: PT?  Phone Note Call from Patient Call back at Home Phone [phone]   Caller: Patient Summary of Call: pt called stating that MD was going to call in a "water pill" for pt at [facility] 11/08/2009. Initial call taken by: Margaret Pyle, CMA,  November 09, 2009 3:02 PM  Follow-up for Phone Call        sorry, he is correct - donhe escript Follow-up by: Corwin Levins MD,  November 09, 2009 3:03 PM  Additional Follow-up for Phone Call Additional follow up Details #1::        pt informed Additional Follow-up by: Margaret Pyle, CMA,  November 09, 2009 3:28 PM    New/Updated Medications: FUROSEMIDE 20 MG TABS (FUROSEMIDE) 1 by mouth once daily as needed swelling Prescriptions: FUROSEMIDE 20 MG TABS (FUROSEMIDE) 1 by mouth once daily as needed swelling  #30 x 1   Entered and Authorized by:   Corwin Levins MD   Signed by:   Corwin Levins MD on 11/09/2009   Method used:   Electronically to        [facility] [address] #[id number]* (retail)       [address] [place], [place]  [address]       Ph: [phone]       Fax: [phone]   RxID:   [id number]

## 2010-11-22 NOTE — Medication Information (Signed)
Summary: [facility]   Imported By: Lester [place] 04/08/2010 08:44:57  _____________________________________________________________________  External Attachment:    Type:   Image     Comment:   External Document

## 2010-11-22 NOTE — Procedures (Signed)
Summary: [facility]  [facility]   Imported By: Lester [place] 12/13/2009 10:15:16  _____________________________________________________________________  External Attachment:    Type:   Image     Comment:   External Document

## 2010-11-22 NOTE — Progress Notes (Signed)
Summary: CBG reading  Phone Note Call from Patient Call back at Home Phone [phone]   Caller: Patient Summary of Call: Pt called to inform MD of CBG reading, lowest 57 ( after taking medication and taking a nap, he awoke feeling jittery. After eating candy cbg increased to 90) highest 156. Pt says he averages 90-120. Initial call taken by: Margaret Pyle, CMA,  July 11, 2010 2:21 PM  Follow-up for Phone Call        ok to change the glimeparide to 2 mg pill (down from the 4 mg) and still HALF two times a day  Follow-up by: Corwin Levins MD,  July 11, 2010 2:28 PM  Additional Follow-up for Phone Call Additional follow up Details #1::        Pt informed Additional Follow-up by: Margaret Pyle, CMA,  July 11, 2010 3:45 PM    New/Updated Medications: GLIMEPIRIDE 2 MG TABS (GLIMEPIRIDE) 1/2 by mouth two times a day Prescriptions: GLIMEPIRIDE 2 MG TABS (GLIMEPIRIDE) 1/2 by mouth two times a day  #60 x 11   Entered and Authorized by:   Corwin Levins MD   Signed by:   Corwin Levins MD on 07/11/2010   Method used:   Electronically to        [facility] [address] #[id number]* (retail)       [address] [place], [place]  [address]       Ph: [phone]       Fax: [phone]   RxID:   [id number]

## 2010-11-22 NOTE — Assessment & Plan Note (Signed)
Summary: 2 WK ROV /NWS   Vital Signs:  Patient profile:   55 year old male Height:      69 inches Weight:      219.38 pounds BMI:     32.51 O2 Sat:      96 % on Room air Temp:     98.2 degrees F oral Pulse rate:   80 / minute BP sitting:   100 / 66  (left arm) Cuff size:   large  Vitals Entered By: Zella Ball Ewing CMA Duncan Dull) (June 28, 2010 1:55 PM)  O2 Flow:  Room air CC: 2 week followup/RE   Primary Care Provider:  Corwin Levins MD  CC:  2 week followup/RE.  History of Present Illness: overall doing ok after being seen in the ER that same date as last visit here with BS 262;  did not need hospn;  sine that time has really been watching the diet, joined the gym, taking meds religioyusly and has f/u with optometry on Monday next.  Overall good compliance. with meds and now diet.  Bronchitits symtpoms resolved except for small loose cough,  voice is back. CBG's now in the mid 100'.  Needs test strips three times a day - new rx.  Has never seen dietary for DM diet in the past.  Pt denies CP, worsening sob, doe, wheezing, orthopnea, pnd, worsening LE edema, palps, dizziness or syncope  Pt denies new neuro symptoms such as headache, facial or extremity weakness  Pt denies polydipsia, polyuria, or low sugar symptoms such as shakiness improved with eating.  Overall good compliance with meds, trying to follow low chol, DM diet, wt stable.  Problems Prior to Update: 1)  Nausea  (ICD-787.02) 2)  Bronchitis-acute  (ICD-466.0) 3)  Insomnia-sleep Disorder-unspec  (ICD-780.52) 4)  Peripheral Edema  (ICD-782.3) 5)  Wheezing  (ICD-786.07) 6)  Wheezing  (ICD-786.07) 7)  Preoperative Examination  (ICD-V72.84) 8)  Allergic Rhinitis  (ICD-477.9) 9)  Preventive Health Care  (ICD-V70.0) 10)  Asthmatic Bronchitis, Acute  (ICD-466.0) 11)  Osteoarthritis, Knees, Bilateral  (ICD-715.96) 12)  Gerd  (ICD-530.81) 13)  Hepatotoxicity, Drug-induced, Risk of  (ICD-V58.69) 14)  Breast Pain, Left   (ICD-611.71) 15)  Hyperlipidemia  (ICD-272.4) 16)  Peripheral Edema  (ICD-782.3) 17)  Diabetes Mellitus, Type II  (ICD-250.00) 18)  Preventive Health Care  (ICD-V70.0) 19)  Asthma, With Acute Exacerbation  (ICD-493.92) 20)  Family History of Cad Male 1st Degree Relative <50  (ICD-V17.3) 21)  Low Back Pain  (ICD-724.2) 22)  Symptom, Abnormal Involuntary Movement Nec  (ICD-781.0) 23)  Depression  (ICD-311) 24)  Asthma  (ICD-493.90) 25)  Anxiety  (ICD-300.00)  Medications Prior to Update: 1)  Depakote 250 Mg  Tbec (Divalproex Sodium) .Marland Kitchen.. 1po Qid 2)  Hydrocodone-Acetaminophen 10-325 Mg  Tabs (Hydrocodone-Acetaminophen) .Marland Kitchen.. 1 By Mouth Q 6 Hrs Prn  As Needed - To Fill March 28, 2010 3)  Spiriva Handihaler 18 Mcg Caps (Tiotropium Bromide Monohydrate) .... One Puff Each Day 4)  Singulair 10 Mg Tabs (Montelukast Sodium) .... Take 1 Tablet By Mouth Once A Day 5)  Adult Aspirin Ec Low Strength 81 Mg Tbec (Aspirin) .Marland Kitchen.. 1 By Mouth Once Daily 6)  Metformin Hcl 1000 Mg Tabs (Metformin Hcl) .Marland Kitchen.. 1po Two Times A Day 7)  Simvastatin 40 Mg Tabs (Simvastatin) .Marland Kitchen.. 1 By Mouth Once Daily 8)  Cetirizine Hcl 10 Mg Tabs (Cetirizine Hcl) .Marland Kitchen.. 1 By Mouth Once Daily As Needed 9)  Lunesta 2 Mg Tabs (Eszopiclone) .Marland KitchenMarland KitchenMarland Kitchen 1  By Mouth At Bedtime As Needed Sleep 10)  Proair Hfa 108 (90 Base) Mcg/act Aers (Albuterol Sulfate) .... 2 Puffs Four Times Per Day As Needed For Wheezing 11)  Citalopram Hydrobromide 10 Mg Tabs (Citalopram Hydrobromide) .Marland Kitchen.. 1 By Mouth Once Daily 12)  Furosemide 20 Mg Tabs (Furosemide) .Marland Kitchen.. 1 By Mouth Once Daily As Needed Swelling 13)  Duoneb 0.5-2.5 (3) Mg/39ml Soln (Ipratropium-Albuterol) .... Use Asd Qid As Needed 14)  Flexeril 5 Mg Tabs (Cyclobenzaprine Hcl) .Marland Kitchen.. 1po Three Times A Day As Needed 15)  Glimepiride 4 Mg Tabs (Glimepiride) .Marland Kitchen.. 1po Two Times A Day 16)  Cephalexin 500 Mg Caps (Cephalexin) .Marland Kitchen.. 1 Po Three Times A Day 17)  Onetouch Ultra Test  Strp (Glucose Blood) .... Use Asd Two Times  A Day  250.02 18)  Lancets  Misc (Lancets) .... Use Asd Two Times A Day  - 250.02 19)  Januvia 50 Mg Tabs (Sitagliptin Phosphate) .Marland Kitchen.. 1po Once Daily  Current Medications (verified): 1)  Depakote 250 Mg  Tbec (Divalproex Sodium) .Marland Kitchen.. 1po Qid 2)  Hydrocodone-Acetaminophen 10-325 Mg  Tabs (Hydrocodone-Acetaminophen) .Marland Kitchen.. 1 By Mouth Q 6 Hrs Prn  As Needed - To Fill March 28, 2010 3)  Spiriva Handihaler 18 Mcg Caps (Tiotropium Bromide Monohydrate) .... One Puff Each Day 4)  Singulair 10 Mg Tabs (Montelukast Sodium) .... Take 1 Tablet By Mouth Once A Day 5)  Adult Aspirin Ec Low Strength 81 Mg Tbec (Aspirin) .Marland Kitchen.. 1 By Mouth Once Daily 6)  Metformin Hcl 1000 Mg Tabs (Metformin Hcl) .Marland Kitchen.. 1po Two Times A Day 7)  Simvastatin 40 Mg Tabs (Simvastatin) .Marland Kitchen.. 1 By Mouth Once Daily 8)  Cetirizine Hcl 10 Mg Tabs (Cetirizine Hcl) .Marland Kitchen.. 1 By Mouth Once Daily As Needed 9)  Lunesta 2 Mg Tabs (Eszopiclone) .Marland Kitchen.. 1 By Mouth At Bedtime As Needed Sleep 10)  Proair Hfa 108 (90 Base) Mcg/act Aers (Albuterol Sulfate) .... 2 Puffs Four Times Per Day As Needed For Wheezing 11)  Citalopram Hydrobromide 10 Mg Tabs (Citalopram Hydrobromide) .Marland Kitchen.. 1 By Mouth Once Daily 12)  Furosemide 20 Mg Tabs (Furosemide) .Marland Kitchen.. 1 By Mouth Once Daily As Needed Swelling 13)  Duoneb 0.5-2.5 (3) Mg/3ml Soln (Ipratropium-Albuterol) .... Use Asd Qid As Needed 14)  Flexeril 5 Mg Tabs (Cyclobenzaprine Hcl) .Marland Kitchen.. 1po Three Times A Day As Needed 15)  Glimepiride 4 Mg Tabs (Glimepiride) .Marland Kitchen.. 1po Two Times A Day 16)  Cephalexin 500 Mg Caps (Cephalexin) .Marland Kitchen.. 1 Po Three Times A Day 17)  Onetouch Ultra Test  Strp (Glucose Blood) .... Use Asd Two Times A Day  250.02 18)  Lancets  Misc (Lancets) .... Use Asd Two Times A Day  - 250.02 19)  Januvia 50 Mg Tabs (Sitagliptin Phosphate) .Marland Kitchen.. 1po Once Daily  Allergies (verified): 1)  ! * Iv Dye  Past History:  Past Medical History: Last updated: 03/18/2009 Anxiety Asthma Depression - disabled H/O Facial  Tic/Tremor Low back pain - chronic Knee pain - chronic - s/p 9 surguries, disabled facial tic Diabetes mellitus, type II - diet Hyperlipidemia GERD Allergic rhinitis  Past Surgical History: Last updated: 06/21/2007 R Knee Surgery X9 Rotator cuff repair- bilateral Inguinal herniorrhaphy Appendectomy Tonsillectomy Cholecystectomy  Social History: Last updated: 03/28/2010 Current Smoker Alcohol use-yes Divorced 1 son - lives with im 1 daughter - lives with ex-wife Drug use-no  Risk Factors: Smoking Status: current (06/21/2007) Packs/Day: 1 PPD (06/21/2007)  Review of Systems       all otherwise negative per pt -  Physical Exam  General:  alert and overweight-appearing.   Head:  normocephalic and atraumatic.   Eyes:  vision grossly intact, pupils equal, and pupils round.   Ears:  R ear normal and L ear normal.   Nose:  no external deformity and no nasal discharge.   Mouth:  no gingival abnormalities and pharynx pink and moist.   Neck:  supple and no masses.   Lungs:  normal respiratory effort and normal breath sounds.   Heart:  normal rate and regular rhythm.   Abdomen:  soft, non-tender, and normal bowel sounds.   Extremities:  no edema, no erythema    Impression & Recommendations:  Problem # 1:  BRONCHITIS-ACUTE (ICD-466.0)  The following medications were removed from the medication list:    Cephalexin 500 Mg Caps (Cephalexin) .Marland Kitchen... 1 po three times a day His updated medication list for this problem includes:    Spiriva Handihaler 18 Mcg Caps (Tiotropium bromide monohydrate) ..... One puff each day    Singulair 10 Mg Tabs (Montelukast sodium) .Marland Kitchen... Take 1 tablet by mouth once a day    Proair Hfa 108 (90 Base) Mcg/act Aers (Albuterol sulfate) .Marland Kitchen... 2 puffs four times per day as needed for wheezing    Duoneb 0.5-2.5 (3) Mg/28ml Soln (Ipratropium-albuterol) ..... Use asd qid as needed with some mild cough but essentialy resvoled, pt reasasured, no further  antibx needed at this time  Problem # 2:  DIABETES MELLITUS, TYPE II (ICD-250.00)  His updated medication list for this problem includes:    Adult Aspirin Ec Low Strength 81 Mg Tbec (Aspirin) .Marland Kitchen... 1 by mouth once daily    Metformin Hcl 1000 Mg Tabs (Metformin hcl) .Marland Kitchen... 1po two times a day    Glimepiride 4 Mg Tabs (Glimepiride) .Marland Kitchen... 1po two times a day    Januvia 50 Mg Tabs (Sitagliptin phosphate) .Marland Kitchen... 1po once daily overall stable overall by hx and exam, ok to continue meds/tx as is, but be aware of low sugars that can occur if he is now excercising and watching the diet as well as he is;  to also refer to Dietary, to check cbg's three times a day   Orders: Diabetic Clinic Referral (Diabetic)  Problem # 3:  HYPERLIPIDEMIA (ICD-272.4)  His updated medication list for this problem includes:    Simvastatin 40 Mg Tabs (Simvastatin) .Marland Kitchen... 1 by mouth once daily  Labs Reviewed: SGOT: 39 (11/08/2009)   SGPT: 44 (11/08/2009)   HDL:26.30 (03/22/2010), 31.40 (11/08/2009)  LDL:56 (03/22/2010), 88 (05/31/2009)  Chol:117 (03/22/2010), 198 (11/08/2009)  Trig:175.0 (03/22/2010), 212.0 (11/08/2009) stable overall by hx and exam, ok to continue meds/tx as is , should improve wioth diet as well  Complete Medication List: 1)  Depakote 250 Mg Tbec (Divalproex sodium) .Marland Kitchen.. 1po qid 2)  Hydrocodone-acetaminophen 10-325 Mg Tabs (Hydrocodone-acetaminophen) .Marland Kitchen.. 1 by mouth q 6 hrs prn  as needed - to fill March 28, 2010 3)  Spiriva Handihaler 18 Mcg Caps (Tiotropium bromide monohydrate) .... One puff each day 4)  Singulair 10 Mg Tabs (Montelukast sodium) .... Take 1 tablet by mouth once a day 5)  Adult Aspirin Ec Low Strength 81 Mg Tbec (Aspirin) .Marland Kitchen.. 1 by mouth once daily 6)  Metformin Hcl 1000 Mg Tabs (Metformin hcl) .Marland Kitchen.. 1po two times a day 7)  Simvastatin 40 Mg Tabs (Simvastatin) .Marland Kitchen.. 1 by mouth once daily 8)  Cetirizine Hcl 10 Mg Tabs (Cetirizine hcl) .Marland Kitchen.. 1 by mouth once daily as needed 9)  Lunesta 2  Mg Tabs (Eszopiclone) .Marland KitchenMarland KitchenMarland Kitchen  1 by mouth at bedtime as needed sleep 10)  Proair Hfa 108 (90 Base) Mcg/act Aers (Albuterol sulfate) .... 2 puffs four times per day as needed for wheezing 11)  Citalopram Hydrobromide 10 Mg Tabs (Citalopram hydrobromide) .Marland Kitchen.. 1 by mouth once daily 12)  Furosemide 20 Mg Tabs (Furosemide) .Marland Kitchen.. 1 by mouth once daily as needed swelling 13)  Duoneb 0.5-2.5 (3) Mg/38ml Soln (Ipratropium-albuterol) .... Use asd qid as needed 14)  Flexeril 5 Mg Tabs (Cyclobenzaprine hcl) .Marland Kitchen.. 1po three times a day as needed 15)  Glimepiride 4 Mg Tabs (Glimepiride) .Marland Kitchen.. 1po two times a day 16)  Onetouch Ultra Test Strp (Glucose blood) .... Use asd three times a day  - 250.02 17)  Lancets Misc (Lancets) .... Use asd two times a day  - 250.02 18)  Januvia 50 Mg Tabs (Sitagliptin phosphate) .Marland Kitchen.. 1po once daily  Patient Instructions: 1)  Continue all previous medications as before this visit  2)  You will be contacted about the referral(s) to: Diabetic diet teaching 3)  Please schedule a follow-up appointment in 3 months with CPX labs and: 4)  HbgA1C prior to visit, ICD-9: 250.02 5)  Urine Microalbumin prior to visit, ICD-9: Prescriptions: ONETOUCH ULTRA TEST  STRP (GLUCOSE BLOOD) use asd three times a day  - 250.02  #270 x 3   Entered and Authorized by:   Corwin Levins MD   Signed by:   Corwin Levins MD on 06/28/2010   Method used:   Print then Give to Patient   RxID:   [id number]

## 2010-11-22 NOTE — Assessment & Plan Note (Signed)
Summary: CPX /NWS  #   Vital Signs:  Patient profile:   55 year old male Height:      69 inches Weight:      240 pounds BMI:     35.57 O2 Sat:      97 % on Room air Temp:     97.2 degrees F oral Pulse rate:   91 / minute BP sitting:   106 / 70  (left arm) Cuff size:   large  Vitals Entered ByZella Ball Ewing (November 08, 2009 3:33 PM)  O2 Flow:  Room air  Preventive Care Screening     declines colonoscopy  CC: congestion,cough/RE   Primary Care Provider:  Corwin Levins MD  CC:  congestion and cough/RE.  History of Present Illness: here for wellness, also with onset 2 days fever, ST and prod cough with mild wheezing and sob;  Pt denies CP, orthopnea, pnd, worsening LE edema, palps, dizziness or syncope . Pt denies new neuro symptoms such as headache, facial or extremity weakness  Pt denies polydipsia, polyuria, or low sugar symptoms such as shakiness improved with eating.  Overall good compliance with meds, trying to follow low chol, DM diet, wt stable, little excercise however   Problems Prior to Update: 1)  Wheezing  (ICD-786.07) 2)  Wheezing  (ICD-786.07) 3)  Preoperative Examination  (ICD-V72.84) 4)  Allergic Rhinitis  (ICD-477.9) 5)  Preventive Health Care  (ICD-V70.0) 6)  Asthmatic Bronchitis, Acute  (ICD-466.0) 7)  Osteoarthritis, Knees, Bilateral  (ICD-715.96) 8)  Gerd  (ICD-530.81) 9)  Hepatotoxicity, Drug-induced, Risk of  (ICD-V58.69) 10)  Breast Pain, Left  (ICD-611.71) 11)  Hyperlipidemia  (ICD-272.4) 12)  Peripheral Edema  (ICD-782.3) 13)  Diabetes Mellitus, Type II  (ICD-250.00) 14)  Preventive Health Care  (ICD-V70.0) 15)  Asthma, With Acute Exacerbation  (ICD-493.92) 16)  Family History of Cad Male 1st Degree Relative <50  (ICD-V17.3) 17)  Low Back Pain  (ICD-724.2) 18)  Symptom, Abnormal Involuntary Movement Nec  (ICD-781.0) 19)  Depression  (ICD-311) 20)  Asthma  (ICD-493.90) 21)  Anxiety  (ICD-300.00)  Medications Prior to Update: 1)  Depakote  250 Mg  Tbec (Divalproex Sodium) .Marland Kitchen.. 1po Qid 2)  Hydrocodone-Acetaminophen 10-325 Mg  Tabs (Hydrocodone-Acetaminophen) .Marland Kitchen.. 1 By Mouth Three Times A Day As Needed - To Fill Sep 27, 2009 3)  Spiriva Handihaler 18 Mcg Caps (Tiotropium Bromide Monohydrate) .... One Puff Each Day 4)  Singulair 10 Mg Tabs (Montelukast Sodium) .... Take 1 Tablet By Mouth Once A Day 5)  Adult Aspirin Ec Low Strength 81 Mg Tbec (Aspirin) .Marland Kitchen.. 1 By Mouth Once Daily 6)  Metformin Hcl 500 Mg Tabs (Metformin Hcl) .... 2 By Mouth Once Daily 7)  Simvastatin 40 Mg Tabs (Simvastatin) .Marland Kitchen.. 1 By Mouth Once Daily 8)  Cetirizine Hcl 10 Mg Tabs (Cetirizine Hcl) .Marland Kitchen.. 1 By Mouth Once Daily As Needed 9)  Zolpidem Tartrate 10 Mg Tabs (Zolpidem Tartrate) .Marland Kitchen.. 1 By Mouth At Bedtime As Needed 10)  Tessalon Perles 100 Mg Caps (Benzonatate) .Marland Kitchen.. 1 - 2 By Mouth Three Times A Day As Needed 11)  Proair Hfa 108 (90 Base) Mcg/act Aers (Albuterol Sulfate) .... 2 Puffs Four Times Per Day As Needed For Wheezing 12)  Prednisone 10 Mg Tabs (Prednisone) .... 3po Qd For 3days, Then 2po Qd For 3days, Then 1po Qd For 3days, Then Stop 13)  Azithromycin 250 Mg Tabs (Azithromycin) .... 2po Qd For 1 Day, Then 1po Qd For 4days, Then Stop  Current Medications (  verified): 1)  Depakote 250 Mg  Tbec (Divalproex Sodium) .Marland Kitchen.. 1po Qid 2)  Hydrocodone-Acetaminophen 10-325 Mg  Tabs (Hydrocodone-Acetaminophen) .Marland Kitchen.. 1 By Mouth Three Times A Day As Needed - To Fill Sep 27, 2009 3)  Spiriva Handihaler 18 Mcg Caps (Tiotropium Bromide Monohydrate) .... One Puff Each Day 4)  Singulair 10 Mg Tabs (Montelukast Sodium) .... Take 1 Tablet By Mouth Once A Day 5)  Adult Aspirin Ec Low Strength 81 Mg Tbec (Aspirin) .Marland Kitchen.. 1 By Mouth Once Daily 6)  Metformin Hcl 500 Mg Tabs (Metformin Hcl) .... 2 By Mouth Once Daily 7)  Simvastatin 40 Mg Tabs (Simvastatin) .Marland Kitchen.. 1 By Mouth Once Daily 8)  Cetirizine Hcl 10 Mg Tabs (Cetirizine Hcl) .Marland Kitchen.. 1 By Mouth Once Daily As Needed 9)  Zolpidem  Tartrate 10 Mg Tabs (Zolpidem Tartrate) .Marland Kitchen.. 1 By Mouth At Bedtime As Needed 10)  Hydrocodone-Homatropine 5-1.5 Mg/33ml Syrp (Hydrocodone-Homatropine) .Marland Kitchen.. 1 Tsp By Mouth Q 6 Hrs As Needed Cough 11)  Proair Hfa 108 (90 Base) Mcg/act Aers (Albuterol Sulfate) .... 2 Puffs Four Times Per Day As Needed For Wheezing 12)  Prednisone 10 Mg Tabs (Prednisone) .... 4po Qd For 3days, Then 3po Qd For 3days, Then 2po Qd For 3days, Then 1po Qd For 3 Days, Then Stop 13)  Augmentin 875-125 Mg Tabs (Amoxicillin-Pot Clavulanate) .... Generic - 1 By Mouth Two Times A Day 14)  Citalopram Hydrobromide 10 Mg Tabs (Citalopram Hydrobromide) .Marland Kitchen.. 1 By Mouth Once Daily  Allergies (verified): 1)  ! * Iv Dye  Past History:  Past Medical History: Last updated: 03/18/2009 Anxiety Asthma Depression - disabled H/O Facial Tic/Tremor Low back pain - chronic Knee pain - chronic - s/p 9 surguries, disabled facial tic Diabetes mellitus, type II - diet Hyperlipidemia GERD Allergic rhinitis  Past Surgical History: Last updated: 06/21/2007 R Knee Surgery X9 Rotator cuff repair- bilateral Inguinal herniorrhaphy Appendectomy Tonsillectomy Cholecystectomy  Family History: Last updated: 06/21/2007 Family History of CAD Male 1st degree relative <50  Social History: Last updated: 08/25/2008 Current Smoker Alcohol use-yes Divorced 1 son - lives with im 1 daughter - lives with ex-wife  Risk Factors: Smoking Status: current (06/21/2007) Packs/Day: 1 PPD (06/21/2007)  Review of Systems       The patient complains of peripheral edema.  The patient denies anorexia, fever, weight loss, weight gain, vision loss, decreased hearing, hoarseness, chest pain, syncope, dyspnea on exertion, prolonged cough, headaches, hemoptysis, abdominal pain, melena, hematochezia, severe indigestion/heartburn, hematuria, incontinence, muscle weakness, suspicious skin lesions, transient blindness, difficulty walking, depression, unusual  weight change, abnormal bleeding, enlarged lymph nodes, and angioedema.         all otherwise negative per pt -  Physical Exam  General:  alert and overweight-appearing.  , mild ill  Head:  normocephalic and atraumatic.   Eyes:  vision grossly intact, pupils equal, and pupils round.   Ears:  bialt tm;s red, sinus nontender Nose:  nasal dischargemucosal pallor and mucosal erythema.   Mouth:  pharyngeal erythema and fair dentition.   Neck:  supple and no masses.   Lungs:  normal respiratory effort, R decreased breath sounds, R wheezes, L decreased breath sounds, and L wheezes.   Heart:  normal rate and regular rhythm.   Abdomen:  soft, non-tender, and normal bowel sounds.   Msk:  no joint tenderness and no joint swelling.   Extremities:  trace bilat LE edema, no erythema Neurologic:  cranial nerves II-XII intact and strength normal in all extremities.   Skin:  color  normal and no rashes.   Psych:  not depressed appearing and slightly anxious.     Impression & Recommendations:  Problem # 1:  Preventive Health Care (ICD-V70.0)  Overall doing well, updated the age appropriate counseling and education;  routine health screening/prevention reviewed and done as appropriate unless declined, immunizations up to date or declined, diet counseling done if overweight, urged to quit smoking if smokes , most recent labs reviewed and current ordered if appropriate, ecg reviewed or declined   Orders: TLB-BMP (Basic Metabolic Panel-BMET) (80048-METABOL) TLB-CBC Platelet - w/Differential (85025-CBCD) TLB-Hepatic/Liver Function Pnl (80076-HEPATIC) TLB-Lipid Panel (80061-LIPID) TLB-PSA (Prostate Specific Antigen) (84153-PSA) TLB-TSH (Thyroid Stimulating Hormone) (84443-TSH) TLB-Udip ONLY (81003-UDIP)  Problem # 2:  ASTHMATIC BRONCHITIS, ACUTE (ICD-466.0)  His updated medication list for this problem includes:    Spiriva Handihaler 18 Mcg Caps (Tiotropium bromide monohydrate) ..... One puff each  day    Singulair 10 Mg Tabs (Montelukast sodium) .Marland Kitchen... Take 1 tablet by mouth once a day    Hydrocodone-homatropine 5-1.5 Mg/41ml Syrp (Hydrocodone-homatropine) .Marland Kitchen... 1 tsp by mouth q 6 hrs as needed cough    Proair Hfa 108 (90 Base) Mcg/act Aers (Albuterol sulfate) .Marland Kitchen... 2 puffs four times per day as needed for wheezing    Augmentin 875-125 Mg Tabs (Amoxicillin-pot clavulanate) .Marland Kitchen... Generic - 1 by mouth two times a day treat as above, f/u any worsening signs or symptoms x, also for depomedrol IM today  Orders: Depo- Medrol 40mg  (J1030) Depo- Medrol 80mg  (J1040) Admin of Therapeutic Inj  intramuscular or subcutaneous (04540)  Problem # 3:  DIABETES MELLITUS, TYPE II (ICD-250.00)  His updated medication list for this problem includes:    Adult Aspirin Ec Low Strength 81 Mg Tbec (Aspirin) .Marland Kitchen... 1 by mouth once daily    Metformin Hcl 500 Mg Tabs (Metformin hcl) .Marland Kitchen... 2 by mouth once daily  Orders: TLB-A1C / Hgb A1C (Glycohemoglobin) (83036-A1C) TLB-Microalbumin/Creat Ratio, Urine (82043-MALB) stable overall by hx and exam, ok to continue meds/tx as is   Pt to cont DM diet, excercise, wt loss efforts; to check labs today , call for cbg> 200 on the steroid  Problem # 4:  ANXIETY (ICD-300.00)  His updated medication list for this problem includes:    Citalopram Hydrobromide 10 Mg Tabs (Citalopram hydrobromide) .Marland Kitchen... 1 by mouth once daily treat as above, f/u any worsening signs or symptoms   Problem # 5:  PERIPHERAL EDEMA (ICD-782.3)  His updated medication list for this problem includes:    Furosemide 20 Mg Tabs (Furosemide) .Marland Kitchen... 1 by mouth once daily as needed swelling treat as above, f/u any worsening signs or symptoms   Complete Medication List: 1)  Depakote 250 Mg Tbec (Divalproex sodium) .Marland Kitchen.. 1po qid 2)  Hydrocodone-acetaminophen 10-325 Mg Tabs (Hydrocodone-acetaminophen) .Marland Kitchen.. 1 by mouth three times a day as needed - to fill Sep 27, 2009 3)  Spiriva Handihaler 18 Mcg Caps  (Tiotropium bromide monohydrate) .... One puff each day 4)  Singulair 10 Mg Tabs (Montelukast sodium) .... Take 1 tablet by mouth once a day 5)  Adult Aspirin Ec Low Strength 81 Mg Tbec (Aspirin) .Marland Kitchen.. 1 by mouth once daily 6)  Metformin Hcl 500 Mg Tabs (Metformin hcl) .... 2 by mouth once daily 7)  Simvastatin 40 Mg Tabs (Simvastatin) .Marland Kitchen.. 1 by mouth once daily 8)  Cetirizine Hcl 10 Mg Tabs (Cetirizine hcl) .Marland Kitchen.. 1 by mouth once daily as needed 9)  Zolpidem Tartrate 10 Mg Tabs (Zolpidem tartrate) .Marland Kitchen.. 1 by mouth at bedtime as needed  10)  Hydrocodone-homatropine 5-1.5 Mg/35ml Syrp (Hydrocodone-homatropine) .Marland Kitchen.. 1 tsp by mouth q 6 hrs as needed cough 11)  Proair Hfa 108 (90 Base) Mcg/act Aers (Albuterol sulfate) .... 2 puffs four times per day as needed for wheezing 12)  Prednisone 10 Mg Tabs (Prednisone) .... 4po qd for 3days, then 3po qd for 3days, then 2po qd for 3days, then 1po qd for 3 days, then stop 13)  Augmentin 875-125 Mg Tabs (Amoxicillin-pot clavulanate) .... Generic - 1 by mouth two times a day 14)  Citalopram Hydrobromide 10 Mg Tabs (Citalopram hydrobromide) .Marland Kitchen.. 1 by mouth once daily 15)  Furosemide 20 Mg Tabs (Furosemide) .Marland Kitchen.. 1 by mouth once daily as needed swelling  Patient Instructions: 1)  you had the steroid shot today 2)  Please take all new medications as prescribed. 3)  Continue all previous medications as before this visit  4)  call in 3 to 4 wks if it seems the nerve medication is not helping enough for an increase in the strength of the medication 5)  Please schedule a follow-up appointment in 6 months with: 6)  BMP prior to visit, ICD-9: 250.02 7)  Lipid Panel prior to visit, ICD-9: 8)  HbgA1C prior to visit, ICD-9: 9)  check your sugars  - call if your sugars are > 200 Prescriptions: CITALOPRAM HYDROBROMIDE 10 MG TABS (CITALOPRAM HYDROBROMIDE) 1 by mouth once daily  #90 x 3   Entered and Authorized by:   Corwin Levins MD   Signed by:   Corwin Levins MD on  11/08/2009   Method used:   Print then Give to Patient   RxID:   [id number] HYDROCODONE-HOMATROPINE 5-1.5 MG/5ML SYRP (HYDROCODONE-HOMATROPINE) 1 tsp by mouth q 6 hrs as needed cough  #6 oz x 1   Entered and Authorized by:   Corwin Levins MD   Signed by:   Corwin Levins MD on 11/08/2009   Method used:   Print then Give to Patient   RxID:   [id number] PREDNISONE 10 MG TABS (PREDNISONE) 4po qd for 3days, then 3po qd for 3days, then 2po qd for 3days, then 1po qd for 3 days, then stop  #30 x 0   Entered and Authorized by:   Corwin Levins MD   Signed by:   Corwin Levins MD on 11/08/2009   Method used:   Print then Give to Patient   RxID:   [id number] AUGMENTIN 875-125 MG TABS (AMOXICILLIN-POT CLAVULANATE) generic - 1 by mouth two times a day  #20 x 0   Entered and Authorized by:   Corwin Levins MD   Signed by:   Corwin Levins MD on 11/08/2009   Method used:   Print then Give to Patient   RxID:   [id number]    Medication Administration  Injection # 1:    Medication: Depo- Medrol 40mg     Diagnosis: ASTHMATIC BRONCHITIS, ACUTE (ICD-466.0)    Route: IM    Site: RUOQ gluteus    Exp Date: 07/2010    Lot #: [id number]    Mfr: Teva    Given by: Zella Ball Ewing (November 08, 2009 4:16 PM)  Injection # 2:    Medication: Depo- Medrol 80mg     Diagnosis: ASTHMATIC BRONCHITIS, ACUTE (ICD-466.0)    Route: IM    Site: RUOQ gluteus    Exp Date: 07/2010    Lot #: [id number]    Mfr: Teva    Given by: Zella Ball Ewing (November 08, 2009 4:16 PM)  Orders Added: 1)  Depo- Medrol 40mg  [J1030] 2)  Depo- Medrol 80mg  [J1040] 3)  Admin of Therapeutic Inj  intramuscular or subcutaneous [96372] 4)  TLB-BMP (Basic Metabolic Panel-BMET) [80048-METABOL] 5)  TLB-CBC Platelet - w/Differential [85025-CBCD] 6)  TLB-Hepatic/Liver Function Pnl [80076-HEPATIC] 7)  TLB-Lipid Panel [80061-LIPID] 8)  TLB-PSA (Prostate Specific Antigen) [84153-PSA] 9)  TLB-TSH (Thyroid Stimulating Hormone) [84443-TSH] 10)   TLB-Udip ONLY [81003-UDIP] 11)  TLB-A1C / Hgb A1C (Glycohemoglobin) [83036-A1C] 12)  TLB-Microalbumin/Creat Ratio, Urine [82043-MALB] 13)  Est. Patient 40-64 years [57846]

## 2010-11-22 NOTE — Progress Notes (Signed)
Summary: cold sxs  Phone Note Call from Patient Call back at Home Phone [phone]   Caller: Patient Summary of Call: Pt states that he has finished course of ATB w/o relief of symptoms. If possible, he would like new ATB sent to his pharmacy Initial call taken by: Brenton Grills MA,  July 06, 2010 1:48 PM  Follow-up for Phone Call        done per emr Follow-up by: Corwin Levins MD,  July 06, 2010 1:54 PM  Additional Follow-up for Phone Call Additional follow up Details #1::        pt informed Additional Follow-up by: Brenton Grills MA,  July 06, 2010 2:30 PM    New/Updated Medications: DOXYCYCLINE HYCLATE 100 MG CAPS (DOXYCYCLINE HYCLATE) 1po two times a day Prescriptions: DOXYCYCLINE HYCLATE 100 MG CAPS (DOXYCYCLINE HYCLATE) 1po two times a day  #20 x 0   Entered and Authorized by:   Corwin Levins MD   Signed by:   Corwin Levins MD on 07/06/2010   Method used:   Electronically to        [facility] [address] [facility] [address] [id number]* (retail)       [address] [place], [place]  [address]       Ph: [phone]       Fax: [phone]   RxID:   [id number]

## 2010-11-22 NOTE — Progress Notes (Signed)
Summary: low CBG  Phone Note Call from Patient Call back at Home Phone [phone]   Caller: Patient Summary of Call: Pt called stating that his CBG average 80-126 but he had 2 episodes of low reading, below 70 which caused pt to feel very jittery. Pt is concened that he may be taking too much medicine as his CBGs seem to be better controlled now. Please advise. Initial call taken by: Margaret Pyle, CMA,  July 04, 2010 11:40 AM  Follow-up for Phone Call        this sounds "good" overall since he has had such difficulty getting the sugar down -  please change the glimeparide to 4 mg - HALF pill twice per day (instead of the 4 mg two times a day he takes now) Follow-up by: Corwin Levins MD,  July 04, 2010 1:07 PM  Additional Follow-up for Phone Call Additional follow up Details #1::        Pt informed and will call back on Friday to report any more low CBGs Additional Follow-up by: Margaret Pyle, CMA,  July 04, 2010 1:29 PM    New/Updated Medications: GLIMEPIRIDE 4 MG TABS (GLIMEPIRIDE) 1/2  po two times a day

## 2010-11-24 NOTE — Medication Information (Signed)
Summary: Ventolin Approved/[facility]  Ventolin Approved/[facility]   Imported By: Sherian Rein 10/28/2010 12:53:19  _____________________________________________________________________  External Attachment:    Type:   Image     Comment:   External Document

## 2010-11-24 NOTE — Progress Notes (Signed)
Summary: med change  Phone Note Call from Patient Call back at Home Phone [phone]   Caller: Patient Summary of Call: Pt called requesting to change Pro-Air to Ventolin and increase quantity to 2 inhalers per month, please advise Initial call taken by: Margaret Pyle, CMA,  October 19, 2010 9:06 AM  Follow-up for Phone Call        ok to change, but should not need more than one inhaler per month;  if he feels he needs more than one per month, we need to consider other "controller" medicaiton  pt should make OV if this is the case Follow-up by: Corwin Levins MD,  October 19, 2010 10:52 AM  Additional Follow-up for Phone Call Additional follow up Details #1::        Pt thinks sxs were not well controlled on ProAir and would like to go back to Ventolin and if sxs remain the same or get worse he will make OV for eval. Additional Follow-up by: Margaret Pyle, CMA,  October 19, 2010 11:03 AM    New/Updated Medications: VENTOLIN HFA 108 (90 BASE) MCG/ACT AERS (ALBUTEROL SULFATE) 2 puffs four times per day as needed Prescriptions: VENTOLIN HFA 108 (90 BASE) MCG/ACT AERS (ALBUTEROL SULFATE) 2 puffs four times per day as needed  #1 x 2   Entered by:   Margaret Pyle, CMA   Authorized by:   Corwin Levins MD   Signed by:   Margaret Pyle, CMA on 10/19/2010   Method used:   Electronically to        [facility] [id number]* (retail)       [address] [place], [place]  [address]       Ph: [phone]       Fax: [phone]   RxID:   [id number]

## 2010-12-06 ENCOUNTER — Ambulatory Visit (INDEPENDENT_AMBULATORY_CARE_PROVIDER_SITE_OTHER): Payer: Medicare Other | Admitting: Internal Medicine

## 2010-12-06 ENCOUNTER — Encounter: Payer: Self-pay | Admitting: Internal Medicine

## 2010-12-06 DIAGNOSIS — R062 Wheezing: Secondary | ICD-10-CM

## 2010-12-06 DIAGNOSIS — J209 Acute bronchitis, unspecified: Secondary | ICD-10-CM

## 2010-12-06 DIAGNOSIS — E119 Type 2 diabetes mellitus without complications: Secondary | ICD-10-CM

## 2010-12-06 DIAGNOSIS — F411 Generalized anxiety disorder: Secondary | ICD-10-CM

## 2010-12-14 NOTE — Assessment & Plan Note (Signed)
Summary: CONGESTION--LOOSING VOICE  STC   Vital Signs:  Patient profile:   55 year old male Height:      70 inches Weight:      232.13 pounds BMI:     33.43 O2 Sat:      94 % on Room air Temp:     98.1 degrees F oral Pulse rate:   81 / minute BP sitting:   100 / 62  (left arm) Cuff size:   large  Vitals Entered By: Zella Ball Ewing CMA (AAMA) (December 06, 2010 2:06 PM)  O2 Flow:  Room air CC: Sore throat, ear pain, congestion and wheezing/RE   Primary Care Provider:  Corwin Levins MD  CC:  Sore throat, ear pain, and congestion and wheezing/RE.  History of Present Illness: here with acute onset 3-4 days fever, Headache, ST, and now increasingly prod cough greenish sputum for 2 days, with mild wheezing and sob/doe, using his inhaler more than prescribed, as well as the nebs at home - already ran out of this months ventolin.  Pt denies CP, orthopnea, pnd, worsening LE edema, palps, dizziness or syncope  Pt denies new neuro symptoms such as headache, facial or extremity weakness  Pt denies polydipsia, polyuria, or low sugar symptoms such as shakiness improved with eating.  Overall good compliance with meds, trying to follow low chol, DM diet, wt stable, little excercise however  CBG' much better now on current regimen in the low 100's.  No recent  wt loss, night sweats, loss of appetite or other constitutional symptoms .  Overall good compliance with meds, and good tolerability. Denies worsening depressive symptoms, suicidal ideation, or panic., though has ongoing anxiety.  Problems Prior to Update: 1)  Nausea  (ICD-787.02) 2)  Insomnia-sleep Disorder-unspec  (ICD-780.52) 3)  Peripheral Edema  (ICD-782.3) 4)  Wheezing  (ICD-786.07) 5)  Wheezing  (ICD-786.07) 6)  Preoperative Examination  (ICD-V72.84) 7)  Allergic Rhinitis  (ICD-477.9) 8)  Preventive Health Care  (ICD-V70.0) 9)  Asthmatic Bronchitis, Acute  (ICD-466.0) 10)  Osteoarthritis, Knees, Bilateral  (ICD-715.96) 11)  Gerd   (ICD-530.81) 12)  Hepatotoxicity, Drug-induced, Risk of  (ICD-V58.69) 13)  Breast Pain, Left  (ICD-611.71) 14)  Hyperlipidemia  (ICD-272.4) 15)  Peripheral Edema  (ICD-782.3) 16)  Diabetes Mellitus, Type II  (ICD-250.00) 17)  Preventive Health Care  (ICD-V70.0) 18)  Asthma, With Acute Exacerbation  (ICD-493.92) 19)  Family History of Cad Male 1st Degree Relative <50  (ICD-V17.3) 20)  Low Back Pain  (ICD-724.2) 21)  Symptom, Abnormal Involuntary Movement Nec  (ICD-781.0) 22)  Depression  (ICD-311) 23)  Asthma  (ICD-493.90) 24)  Anxiety  (ICD-300.00)  Medications Prior to Update: 1)  Depakote 250 Mg  Tbec (Divalproex Sodium) .Marland Kitchen.. 1po Qid 2)  Hydrocodone-Acetaminophen 10-325 Mg  Tabs (Hydrocodone-Acetaminophen) .Marland Kitchen.. 1 By Mouth Q 6 Hrs Prn  As Needed - To Fill Sep 27, 2010 3)  Spiriva Handihaler 18 Mcg Caps (Tiotropium Bromide Monohydrate) .... One Puff Each Day 4)  Singulair 10 Mg Tabs (Montelukast Sodium) .... Take 1 Tablet By Mouth Once A Day 5)  Adult Aspirin Ec Low Strength 81 Mg Tbec (Aspirin) .Marland Kitchen.. 1 By Mouth Once Daily 6)  Simvastatin 40 Mg Tabs (Simvastatin) .Marland Kitchen.. 1 By Mouth Once Daily 7)  Cetirizine Hcl 10 Mg Tabs (Cetirizine Hcl) .Marland Kitchen.. 1 By Mouth Once Daily As Needed 8)  Temazepam 15 Mg Caps (Temazepam) .Marland Kitchen.. 1-2 At Bedtime As Needed Sleep 9)  Ventolin Hfa 108 (90 Base) Mcg/act Aers (Albuterol Sulfate) .Marland KitchenMarland KitchenMarland Kitchen  2 Puffs Four Times Per Day As Needed 10)  Citalopram Hydrobromide 10 Mg Tabs (Citalopram Hydrobromide) .Marland Kitchen.. 1 By Mouth Once Daily 11)  Furosemide 20 Mg Tabs (Furosemide) .Marland Kitchen.. 1 By Mouth Once Daily As Needed Swelling 12)  Duoneb 0.5-2.5 (3) Mg/50ml Soln (Ipratropium-Albuterol) .... Use Asd Qid As Needed 13)  Flexeril 5 Mg Tabs (Cyclobenzaprine Hcl) .Marland Kitchen.. 1po Three Times A Day As Needed 14)  Onetouch Ultra Test  Strp (Glucose Blood) .... Use Asd Three Times A Day  - 250.02 15)  Lancets  Misc (Lancets) .... Use Asd Two Times A Day  - 250.02 16)  Janumet 50-1000 Mg Tabs  (Sitagliptin-Metformin Hcl) .Marland Kitchen.. 1 By Mouth Two Times A Day  Current Medications (verified): 1)  Depakote 250 Mg  Tbec (Divalproex Sodium) .Marland Kitchen.. 1po Qid 2)  Hydrocodone-Acetaminophen 10-325 Mg  Tabs (Hydrocodone-Acetaminophen) .Marland Kitchen.. 1 By Mouth Q 6 Hrs Prn  As Needed - To Fill Sep 27, 2010 3)  Spiriva Handihaler 18 Mcg Caps (Tiotropium Bromide Monohydrate) .... One Puff Each Day 4)  Singulair 10 Mg Tabs (Montelukast Sodium) .... Take 1 Tablet By Mouth Once A Day 5)  Adult Aspirin Ec Low Strength 81 Mg Tbec (Aspirin) .Marland Kitchen.. 1 By Mouth Once Daily 6)  Simvastatin 40 Mg Tabs (Simvastatin) .Marland Kitchen.. 1 By Mouth Once Daily 7)  Cetirizine Hcl 10 Mg Tabs (Cetirizine Hcl) .Marland Kitchen.. 1 By Mouth Once Daily As Needed 8)  Temazepam 15 Mg Caps (Temazepam) .Marland Kitchen.. 1-2 At Bedtime As Needed Sleep 9)  Ventolin Hfa 108 (90 Base) Mcg/act Aers (Albuterol Sulfate) .... 2 Puffs Four Times Per Day As Needed 10)  Citalopram Hydrobromide 10 Mg Tabs (Citalopram Hydrobromide) .Marland Kitchen.. 1 By Mouth Once Daily 11)  Furosemide 20 Mg Tabs (Furosemide) .Marland Kitchen.. 1 By Mouth Once Daily As Needed Swelling 12)  Duoneb 0.5-2.5 (3) Mg/25ml Soln (Ipratropium-Albuterol) .... Use Asd Qid As Needed 13)  Flexeril 5 Mg Tabs (Cyclobenzaprine Hcl) .Marland Kitchen.. 1po Three Times A Day As Needed 14)  Onetouch Ultra Test  Strp (Glucose Blood) .... Use Asd Three Times A Day  - 250.02 15)  Lancets  Misc (Lancets) .... Use Asd Two Times A Day  - 250.02 16)  Janumet 50-1000 Mg Tabs (Sitagliptin-Metformin Hcl) .Marland Kitchen.. 1 By Mouth Two Times A Day 17)  Azithromycin 250 Mg Tabs (Azithromycin) .... 2po Qd For 1 Day, Then 1po Qd For 4days, Then Stop 18)  Prednisone 10 Mg Tabs (Prednisone) .... 3po Qd For 3days, Then 2po Qd For 3days, Then 1po Qd For 3days, Then Stop 19)  Tussionex Pennkinetic Er 10-8 Mg/30ml Lqcr (Hydrocod Polst-Chlorphen Polst) .... Generic Please - 1 Tsp By Mouth Two Times A Day As Needed Cough  Allergies (verified): 1)  ! * Iv Dye  Past History:  Past Medical History: Last  updated: 03/18/2009 Anxiety Asthma Depression - disabled H/O Facial Tic/Tremor Low back pain - chronic Knee pain - chronic - s/p 9 surguries, disabled facial tic Diabetes mellitus, type II - diet Hyperlipidemia GERD Allergic rhinitis  Past Surgical History: Last updated: 06/21/2007 R Knee Surgery X9 Rotator cuff repair- bilateral Inguinal herniorrhaphy Appendectomy Tonsillectomy Cholecystectomy  Social History: Last updated: 03/28/2010 Current Smoker Alcohol use-yes Divorced 1 son - lives with im 1 daughter - lives with ex-wife Drug use-no  Risk Factors: Smoking Status: current (06/21/2007) Packs/Day: 1 PPD (06/21/2007)  Review of Systems       all otherwise negative per pt -    Physical Exam  General:  alert and overweight-appearing. , mild ill  Head:  normocephalic and atraumatic.   Eyes:  vision grossly intact, pupils equal, and pupils round.   Ears:  bilat tm's midl erythema, sinus nontender Nose:  nasal dischargemucosal pallor and mucosal edema.   Mouth:  pharyngeal erythema and fair dentition.   Neck:  supple and cervical lymphadenopathy.   Lungs:  normal respiratory effort, R decreased breath sounds, R wheezes, L decreased breath sounds, and L wheezes.  but no rales Heart:  normal rate and regular rhythm.   Extremities:  no edema, no erythema  Psych:  not depressed appearing and moderately anxious.     Impression & Recommendations:  Problem # 1:  BRONCHITIS-ACUTE (ICD-466.0)  His updated medication list for this problem includes:    Spiriva Handihaler 18 Mcg Caps (Tiotropium bromide monohydrate) ..... One puff each day    Singulair 10 Mg Tabs (Montelukast sodium) .Marland Kitchen... Take 1 tablet by mouth once a day    Ventolin Hfa 108 (90 Base) Mcg/act Aers (Albuterol sulfate) .Marland Kitchen... 2 puffs four times per day as needed    Duoneb 0.5-2.5 (3) Mg/30ml Soln (Ipratropium-albuterol) ..... Use asd qid as needed    Azithromycin 250 Mg Tabs (Azithromycin) .Marland Kitchen... 2po qd  for 1 day, then 1po qd for 4days, then stop    Tussionex Pennkinetic Er 10-8 Mg/59ml Lqcr (Hydrocod polst-chlorphen polst) .Marland Kitchen... Generic please - 1 tsp by mouth two times a day as needed cough treat as above, f/u any worsening signs or symptoms , also for antibx, cough med as prescribed  Take antibiotics and other medications as directed. Encouraged to push clear liquids, get enough rest, and take acetaminophen as needed. To be seen in 5-7 days if no improvement, sooner if worse.  Problem # 2:  WHEEZING (ICD-786.07) mild, likely due to above, for depomedrol IM today, and predpack for home  Problem # 3:  DIABETES MELLITUS, TYPE II (ICD-250.00) Assessment: Improved  His updated medication list for this problem includes:    Adult Aspirin Ec Low Strength 81 Mg Tbec (Aspirin) .Marland Kitchen... 1 by mouth once daily    Janumet 50-1000 Mg Tabs (Sitagliptin-metformin hcl) .Marland Kitchen... 1 by mouth two times a day  Labs Reviewed: Creat: 1.0 (09/23/2010)    Reviewed HgBA1c results: 6.8 (09/23/2010)  13.4 (06/14/2010) improved by hx, likely to see increased BS on the steroids, pt to call for cbg > 200 or onset polys, o/w Continue all previous medications as before this visit   Problem # 4:  ANXIETY (ICD-300.00)  His updated medication list for this problem includes:    Citalopram Hydrobromide 10 Mg Tabs (Citalopram hydrobromide) .Marland Kitchen... 1 by mouth once daily stable overall by hx and exam, ok to continue meds/tx as is   Discussed medication use and relaxation techniques.   Complete Medication List: 1)  Depakote 250 Mg Tbec (Divalproex sodium) .Marland Kitchen.. 1po qid 2)  Hydrocodone-acetaminophen 10-325 Mg Tabs (Hydrocodone-acetaminophen) .Marland Kitchen.. 1 by mouth q 6 hrs prn  as needed - to fill Sep 27, 2010 3)  Spiriva Handihaler 18 Mcg Caps (Tiotropium bromide monohydrate) .... One puff each day 4)  Singulair 10 Mg Tabs (Montelukast sodium) .... Take 1 tablet by mouth once a day 5)  Adult Aspirin Ec Low Strength 81 Mg Tbec (Aspirin)  .Marland Kitchen.. 1 by mouth once daily 6)  Simvastatin 40 Mg Tabs (Simvastatin) .Marland Kitchen.. 1 by mouth once daily 7)  Cetirizine Hcl 10 Mg Tabs (Cetirizine hcl) .Marland Kitchen.. 1 by mouth once daily as needed 8)  Temazepam 15 Mg Caps (Temazepam) .Marland Kitchen.. 1-2 at bedtime as needed sleep  9)  Ventolin Hfa 108 (90 Base) Mcg/act Aers (Albuterol sulfate) .... 2 puffs four times per day as needed 10)  Citalopram Hydrobromide 10 Mg Tabs (Citalopram hydrobromide) .Marland Kitchen.. 1 by mouth once daily 11)  Furosemide 20 Mg Tabs (Furosemide) .Marland Kitchen.. 1 by mouth once daily as needed swelling 12)  Duoneb 0.5-2.5 (3) Mg/77ml Soln (Ipratropium-albuterol) .... Use asd qid as needed 13)  Flexeril 5 Mg Tabs (Cyclobenzaprine hcl) .Marland Kitchen.. 1po three times a day as needed 14)  Onetouch Ultra Test Strp (Glucose blood) .... Use asd three times a day  - 250.02 15)  Lancets Misc (Lancets) .... Use asd two times a day  - 250.02 16)  Janumet 50-1000 Mg Tabs (Sitagliptin-metformin hcl) .Marland Kitchen.. 1 by mouth two times a day 17)  Azithromycin 250 Mg Tabs (Azithromycin) .... 2po qd for 1 day, then 1po qd for 4days, then stop 18)  Prednisone 10 Mg Tabs (Prednisone) .... 3po qd for 3days, then 2po qd for 3days, then 1po qd for 3days, then stop 19)  Tussionex Pennkinetic Er 10-8 Mg/77ml Lqcr (Hydrocod polst-chlorphen polst) .... Generic please - 1 tsp by mouth two times a day as needed cough  Patient Instructions: 1)  you had the steroid shot today 2)  Please take all new medications as prescribed 3)  Continue all previous medications as before this visit  4)  Please only use the inhaler as prescribed 5)  Please schedule a follow-up appointment in 3 months, or sooner if needed with: 6)  BMP prior to visit, ICD-9:250.02 7)  Lipid Panel prior to visit, ICD-9: 8)  HbgA1C prior to visit, ICD-9: Prescriptions: VENTOLIN HFA 108 (90 BASE) MCG/ACT AERS (ALBUTEROL SULFATE) 2 puffs four times per day as needed  #1 x 5   Entered and Authorized by:   Corwin Levins MD   Signed by:   Corwin Levins  MD on 12/06/2010   Method used:   Print then Give to Patient   RxID:   [id number] TUSSIONEX PENNKINETIC ER 10-8 MG/5ML LQCR (HYDROCOD POLST-CHLORPHEN POLST) generic please - 1 tsp by mouth two times a day as needed cough  #6 oz x 1   Entered and Authorized by:   Corwin Levins MD   Signed by:   Corwin Levins MD on 12/06/2010   Method used:   Print then Give to Patient   RxID:   [id number] PREDNISONE 10 MG TABS (PREDNISONE) 3po qd for 3days, then 2po qd for 3days, then 1po qd for 3days, then stop  #18 x 0   Entered and Authorized by:   Corwin Levins MD   Signed by:   Corwin Levins MD on 12/06/2010   Method used:   Electronically to        [facility] [address] #[id number]* (retail)       [address] [place], [place]  [address]       Ph: [phone]       Fax: [phone]   RxID:   [id number] AZITHROMYCIN 250 MG TABS (AZITHROMYCIN) 2po qd for 1 day, then 1po qd for 4days, then stop  #6 x 1   Entered and Authorized by:   Corwin Levins MD   Signed by:   Corwin Levins MD on 12/06/2010   Method used:   Electronically to        [facility] [address] #[id number]* (retail)       [address]  [place], [place]  [address]       Ph: [phone]       Fax: [phone]   RxID:   [id number]    Orders Added: 1)  Est. Patient Level IV [84132]  Appended Document: CONGESTION--LOOSING VOICE  STC       Medication Administration  Injection # 1:    Medication: Depo- Medrol 40mg     Diagnosis: WHEEZING (ICD-786.07)    Route: IM    Site: RUOQ gluteus    Exp Date: 04/2013    Lot #: [id number]    Mfr: Pharmacia    Comments: Patient received 120mg  Depo-medrol    Patient tolerated injection without complications    Given by: Zella Ball Ewing CMA Duncan Dull) (December 06, 2010 3:09 PM)  Injection # 2:    Medication: Depo- Medrol 80mg     Diagnosis: WHEEZING (ICD-786.07)    Route: IM    Site: RUOQ gluteus    Exp Date: 04/2013    Lot #: [id number]    Mfr: [employer]    Given by: Zella Ball  Ewing CMA (AAMA) (December 06, 2010 3:09 PM)  Orders Added: 1)  Depo- Medrol 40mg  [J1030] 2)  Depo- Medrol 80mg  [J1040] 3)  Admin of Therapeutic Inj  intramuscular or subcutaneous [27253]

## 2010-12-28 ENCOUNTER — Encounter: Payer: Self-pay | Admitting: Internal Medicine

## 2011-01-05 ENCOUNTER — Telehealth: Payer: Self-pay | Admitting: Internal Medicine

## 2011-01-06 LAB — POCT I-STAT, CHEM 8
Calcium, Ion: 1.06 mmol/L — ABNORMAL LOW (ref 1.12–1.32)
Chloride: 102 mEq/L (ref 96–112)
Glucose, Bld: 262 mg/dL — ABNORMAL HIGH (ref 70–99)
HCT: 41 % (ref 39.0–52.0)

## 2011-01-06 LAB — GLUCOSE, CAPILLARY: Glucose-Capillary: 344 mg/dL — ABNORMAL HIGH (ref 70–99)

## 2011-01-10 NOTE — Progress Notes (Signed)
Summary: Chase Gibson  Phone Note Call from Patient Call back at Home Phone [phone]   Caller: Patient Summary of Call: Pt called stating that ST and ear pressure were not completely resolved with ABX given at last OV and is has return a little worse than before. Pt is requesting Rx for stronger ABX, please advise. Initial call taken by: Margaret Pyle, CMA,  January 05, 2011 8:13 AM  Follow-up for Phone Call        done per emr Follow-up by: Corwin Levins MD,  January 05, 2011 8:59 AM  Additional Follow-up for Phone Call Additional follow up Details #1::        Pt advised via VM Additional Follow-up by: Margaret Pyle, CMA,  January 05, 2011 9:40 AM    New/Updated Medications: DOXYCYCLINE HYCLATE 100 MG CAPS (DOXYCYCLINE HYCLATE) 1po two times a day Prescriptions: DOXYCYCLINE HYCLATE 100 MG CAPS (DOXYCYCLINE HYCLATE) 1po two times a day  #20 x 0   Entered and Authorized by:   Corwin Levins MD   Signed by:   Corwin Levins MD on 01/05/2011   Method used:   Electronically to        [facility] #[id number]* (retail)       [address] [place], [place]  [address]       Ph: [phone]       Fax: [phone]   RxID:   [id number]

## 2011-01-10 NOTE — Letter (Signed)
Summary: Darvin Neighbours MD/[facility]  Darvin Neighbours MD/[facility]   Imported By: Lester [place] 01/03/2011 10:10:50  _____________________________________________________________________  External Attachment:    Type:   Image     Comment:   External Document

## 2011-03-01 ENCOUNTER — Other Ambulatory Visit: Payer: Self-pay | Admitting: Internal Medicine

## 2011-03-10 NOTE — Op Note (Signed)
Chase Gibson, Chase Gibson             ACCOUNT NO.:  [id number]   MEDICAL RECORD NO.:  [id number]          PATIENT TYPE:  INP   LOCATION:  0002                         FACILITY:  [facility]   PHYSICIAN:  John L. Rendall III, M.D.DATE OF BIRTH:  December 23, 1955   DATE OF PROCEDURE:  08/15/2004  DATE OF DISCHARGE:                                 OPERATIVE REPORT   INDICATION AND JUSTIFICATION FOR PROCEDURE:  History of 9 knee operations  with chronic pain and osteoarthritis on x-ray.   JUSTIFICATION FOR INPATIENT SETTING:  Need for extensive therapy and  monitoring.   PREOPERATIVE DIAGNOSIS:  End-stage osteoarthritis, right knee.   SURGICAL PROCEDURE:  Computer-navigation assisted right LCS total knee  replacement.   POSTOPERATIVE DIAGNOSIS:  End-stage osteoarthritis, right knee.   SURGEON:  John L. Rendall, M.D.   ASSISTANT:  Legrand Pitts. Duffy, P.A.-C   ANESTHESIA:  General.   PATHOLOGY:  The patient has history of 9 previous knee surgeries and has  areas of bare bone scattered throughout the tricompartmental area with  chronic synovitis, fat pad fibrosis, and medial scarring with varus  deformity of the knee 4 degrees prior to surgery.   PROCEDURE:  Under general anesthesia, the right leg was prepared with  Duraprep and draped as a sterile field. The previous curved medial  parapatellar incision is incorporated in the new total knee incision and is  curved to the midline proximally and distally. Sections carried through skin  and subcutaneous tissue, and quadriceps is incised in the medial one third,  going medial parapatellar down to the medial side of the tibial tubercle.  Bare bone was exposed on the superior medial tibia and lateral tibia under  the fat pad. The knee was flexed. The femur is sized at a large. Using  rongeur and so forth, debridement of cruciates and anterior menisci and fat  pad are done to prepare for computer navigation. Schanz pins were then  inserted within the  total knee incision on the femur medially and through  stab punctures medially on the tibia just below the total knee incision.  Once these were in place, computer mapping was done, first finding the hip  center, than the medial and lateral malleoli of the ankle and progressive  mapping the full proximal tibia and distal femur. This took 25 minutes on  the tourniquet to complete the mapping. At the end of this time, the first  guide was used for proximal tibial resection with the computer assistance  for mapping. Once the proximal tibia was resected, extensive time was done  in balancing varus and valgus, and it required a full release of the MCL  followed by the pes anserinus and the semimembranosus before the leg came  out of varus. He had a very tight contracture of the knee in varus,  requiring a full medial release, extending around to the posterior medial  corner. Once this was done, the knee would go into anatomical alignment with  0 degrees variation from weight bearing axis. Once this was completed,  femoral cuts were made, resecting anterior and posterior flare of the distal  femur  with 0 variance from varus and valgus. The distal femoral cut was  made. The recessing guide cut was made. Proximal tibia was then exposed and  sized at a  #4. Center peg hole with keel was made. Once this was completed,  trial components were inserted with a 17.5 bearing, basically right off what  the computer navigation suggested, #4 tibia, 17.5 bearing, and large femur.  Patella was then osteotomized, and 3 peg holes placed. With the knee in  extension with trial components in, the variants from anatomic access was 0  degrees. Permanent components were then cemented in place, and the Schanz  pins were removed. The tourniquet was let down at an hour and 45 minutes.  Multiple small vessels were cauterized. Medium Hemovac drain was left in  place. Excess cement was removed. The knee was then closed in  layers with #1  Dacron, #1 and 2-0 Vicryl, and skin clips. Operative time approximately 2  hours. The patient tolerated the procedure well and returned to recovery in  good condition.     Daisy Blossom  D:  08/15/2004  T:  08/15/2004  Job:  [id number]

## 2011-03-10 NOTE — Op Note (Signed)
[facility]  Patient:    Chase Gibson, Chase Gibson                    MRN: [id number] Proc. Date: 08/08/00 Adm. Date:  [id number] Attending:  Tempie Donning                           Operative Report  OPERATIVE PROCEDURE:  Repair, right inguinal hernia -- combined direct and indirect; with Prolene mesh underlay patch and onlay prosthesis.  SURGEON:  Gita Kudo, M.D.  ANESTHESIA:  General.  PREOPERATIVE DIAGNOSIS:  Left inguinal hernia.  POSTOPERATIVE DIAGNOSIS:  Left inguinal hernia, combined direct and indirect.  CLINICAL SUMMARY:  A 55 year old male admitted for elective left inguinal hernia repair.   Physical examination shows an enlarging hernia that is symptomatic.  OPERATIVE FINDINGS: The patient had a medium sized indirect hernia, with a sac that was easily reduced of its contents.  He also had a small direct hernia.  Under satisfactory general anesthesia, having received 1.0 g Ancef preoperatively, the patients abdomen and groin were prepped and draped in a standard fashion.  Transverse incision was made on the left and carried down to and through the external ring and external oblique.  Good exposure with self-retaining retractors was obtained.  The cord and its contents were mobilized, and the indirect sac identified and dissected high.  The contents were easily reduced and the sac was twisted and secured at the neck with a 0 Prolene suture ligature.  The excess sac was cut away, and the ends of the suture left long.  Then the floor of the canal was opened from the pubis to the internal ring, and finger dissection used to develop a preperitoneal space.  The inferior epigastric vessels were isolated and elevated.  Then, a portion of the 3 x 6 inch piece of Prolene mesh was tailored into an oval.  A gauze was used to hold the preperitoneal contents away, and the mesh oval was sutured to Coopers ligament with a 0 Prolene suture.   It was unfolded inferiorly and laterally, and the gauze removed.  The remainder of the mesh was unfolded superiorly.  This was done under the epigastric vessels.  The floor of the canal was then closed over this with a running 0 Prolene suture, taking intermittent bites of the mesh.  After it was tied at the internal ring, the ring was snug and the ends of this suture left long.  The remainder of the mesh was then tailored into an oval, with the slit made for the cord structures.  It was anchored at the internal ring with the previous suture, and then tacked around it peripherally under slight tension with interrupted 0 Prolene to the inguinal ligament below, and the internal oblique above.  The tails were sutured to each other in the fascia above and lateral to the cord.  The wound was lavaged with saline, infiltrated with 0.5% Marcaine with epinephrine for postoperative analgesia.  The cord structures and nerves were inspected; were not injured.  The wound was then closed in layers with running 2-0 Vicryl for external oblique, 2-0 Vicryl for subcutaneous, 3-0 Vicryl for subcutaneous, and Steri-Strips for skin.  Sterile absorbing dressing was applied.  The patient went to the recovery room from the operating room in good condition without complication. DD:  08/08/00 TD:  08/08/00 Job: [id number]

## 2011-03-10 NOTE — Op Note (Signed)
Chase Gibson, Chase Gibson             ACCOUNT NO.:  [id number]   MEDICAL RECORD NO.:  [id number]          PATIENT TYPE:  AMB   LOCATION:  DAY                          FACILITY:  [facility]   PHYSICIAN:  Ronald A. Gioffre, M.D.DATE OF BIRTH:  Jun 28, 1956   DATE OF PROCEDURE:  02/01/2006  DATE OF DISCHARGE:                                 OPERATIVE REPORT   SURGEON:  Georges Lynch. Darrelyn Hillock, M.D.   ASSISTANT:  Nurse.   PREOPERATIVE DIAGNOSIS:  Torn medial meniscus, left knee, with a chronic  synovitis.   POSTOPERATIVE DIAGNOSIS:  Chronic synovitis, left knee, with early  arthritis.   PROCEDURE:  1.  Diagnostic arthroscopy, left knee.  2.  Synovectomy, left knee.   PROCEDURE:  Under general anesthesia, routine orthopedic prepping and  draping of the left lower carried out.  The patient had 1 gram of IV Ancef  preoperative.  At this time after sterile prep and drape was carried out, a  small punctate incision made in the suprapatellar pouch. Inflow cannula was  inserted, and knee was distended with saline.  Another small punctate  incision was made in the anterolateral joint. The arthroscope was entered  from a lateral approach, and a complete diagnostic arthroscopy was carried  out.  Note, he had early chondromalacia of his patella.  He had a rather  severe chronic synovitis in the suprapatellar pouch. I introduced the shaver  suction device using medial approach and did a synovectomy of the  suprapatellar pouch.  Following that, went down into the lateral joint.  Lateral joint was clear. Meniscus was intact.  It was probed.  It was  intact. Cruciates were intact.  I probed the medial meniscus with a nerve  hook.  It was definitely intact.  There were no tears despite what the MRI  showed.  There may have been a small fraying of the meniscus but no major  tears.  He did have a chronic synovitis, and I think his main problem was  synovitis secondary to early arthritic changes.  Thoroughly  irrigated out  the knee after doing a synovectomy medially as well.  I removed all of the  fluid, irrigated the knee out again, removed the fluid. I then closed all  three punctate incisions with 3-0 nylon suture.  I injected 30 cc half  percent Marcaine and epinephrine in the knee joint. Sterile Neosporin  dressing was applied.  The patient left the operative room in satisfactory  condition.   INSTRUCTIONS:  1.  Postoperatively, he will be on aspirin 325 mg b.i.d. today and x2 weeks      b.i.d.  2.  He will be on Percocet 07/28/49 for pain.  3.  We will see him in the office in 10 to 12 days for follow-up or prior if      there is a problem.  4.  He will be on crutches, partial to full weightbearing as tolerated.          ______________________________  Georges Lynch. Darrelyn Hillock, M.D.    RAG/MEDQ  D:  02/01/2006  T:  02/01/2006  Job:  [id number]

## 2011-03-10 NOTE — Discharge Summary (Signed)
NAMEREUEL, LAMADRID             ACCOUNT NO.:  [id number]   MEDICAL RECORD NO.:  [id number]          PATIENT TYPE:  INP   LOCATION:  0481                         FACILITY:  [facility]   PHYSICIAN:  John L. Rendall, M.D.  DATE OF BIRTH:  1956-08-06   DATE OF ADMISSION:  08/15/2004  DATE OF DISCHARGE:  08/18/2004                                 DISCHARGE SUMMARY   ADMISSION DIAGNOSES:  1.  End-stage osteoarthritis, right knee.  2.  Depression.  3.  Tobacco abuse.   DISCHARGE DIAGNOSES:  1.  End-stage osteoarthritis, right knee, status post right total knee      arthroplasty.  2.  Anxiety, now resolved.  3.  Hypoxia, now resolved.  4.  Acute blood loss anemia secondary to surgery.  5.  Depression.  6.  Tobacco abuse.   SURGICAL PROCEDURES:  On August 15, 2004, Mr. North Johns underwent a right  total knee arthroplasty with computer navigation by Dr. Jonny Ruiz L. Rendall  assisted by Legrand Pitts. Duffy, P.A.-C. He had an LCS complete metal-backed  patella cemented size large with a LCS complete primary femoral component  cemented size large right. A LCS complete RP insert size large 17.5-mm  thickness and a DePuy MBT keeled tibial tray cemented size 4.   COMPLICATIONS:  None.   CONSULTS:  1.  Physical therapy and case management consult August 16, 2004.  2.  Occupational therapy consult August 17, 2004.   HISTORY OF PRESENT ILLNESS:  This 55 year old white male patient presented  to Dr. Priscille Kluver with right knee pain for many years. The patient is a  throbbing, sharp, tingling pain that is constant. The knee gives way,  catches and causes some popping. It awakens him at night. X-rays show  arthritic changes. He has failed conservative treatment. Because of that, he  is presenting for a right knee replacement.   HOSPITAL COURSE:  Mr. Pound tolerated his surgical procedure without  immediate postoperative complications. He was transferred to [facility]. On  postoperative day #1, he was  afebrile. Vitals stable. O2 saturation had been  a little low. Aggressive pulmonary toilet was encouraged and that improved.  Right knee was neurovascularly intact, and he was started on therapy per  protocol.   On postoperative day #2, he continued to do well with therapy. He refused a  second dose of Arixtra until he learned more about the medicine. We will  able to restart it the next day. On postoperative day #3, he was doing very  well. He was concerned about the cost of the Arixtra. Discussion was had  with the patient about the cost of that, for the length of time he would  need to be on it, and use of Lovenox and Coumadin until the Coumadin was  therapeutic, and he agreed to go home on the Arixtra. It was felt he was  ready for discharge at that time, and he was discharged home later that day.  Right knee incision at that time was well approximated with staples.  Hemoglobin was 104 and hematocrit 30.   DIET:  He can resume his regular prehospitalization diet.  DISCHARGE MEDICATIONS:  He may resume his prehospitalization medications  except no Vicodin while on the Percocet and OxyContin. These medications  included:  1.  Seroquel 200 mg p.o. q.h.s.  2.  Depakote 1,000 mg p.o. q.a.m.  3.  Wellbutrin 300 mg p.o. q.a.m.   Additional medicines at this time include:  1.  Arixtra 2.5 mg subcu once a day at 8 p.m., last dose August 22, 2004.  2.  On November 1, he is to start 1 baby aspirin a day for 1 month.  3.  OxyContin 10 mg 1 tablet p.o. q.12h. for 10 days, 20 of these with no      refill. He is not to crush or chew these tablets.  4.  Percocet 5/325 mg 1 to 2 tablets p.o. q.4h. p.r.n. for pain, 50 with no      refill.  5.  Robaxin 500 mg 1 to 2 tablets p.o. q.6h. p.r.n. for spasms, 40 with no      refill.   ACTIVITY:  He is to be out of bed weight bearing as tolerated on the right  leg with the use of a walker. He is to have home CPM 0 to 100 degrees 6 to 8  hours a day  and to continue home health PT per [facility].  Please see the blue total knee discharge sheet for further activity  instructions.   WOUND CARE:  He may shower after no drainage from the wound for 2 days.  Please see the blue total knee discharge sheet for further wound care  instructions.   FOLLOW UP:  He needs to follow up with Dr. Priscille Kluver in our office at the end  of next week. He needs to call [phone] for that appointment.   LABORATORY DATA:  On October 18, white count was 9.6. It went to a high of  13.4 on October 25 and then was 9.6 again on October 27. Hemoglobin ranged  from 16.2 on October 18 to a low of 10.4 on October 27 with hematocrit 46.3  on October 18 and 30 on October 27. Platelet count remained fairly stable at  a low of 199 on October 26 to a high of 244 on October 25.   Glucose ranged from 94 on October 18 to 119 on October 26. Calcium from 9.1  on October 18 to 7.8 on October 25. Total protein was 5.7 with an albumin of  3 on October 18.   Urinalysis on October 18 was negative. On October 24, it showed specific  gravity of 1.037, small amount of bilirubin, trace ketones. All other  indices were negative. Urine culture done on October 18 showed 15,000  colonies/mL of Escherichia coli which was sensitive to all antibiotics  tested.     Legrand Pitts   KED/MEDQ  D:  09/09/2004  T:  09/10/2004  Job:  [id number]   cc:   Corwin Levins, M.D. [facility]

## 2011-03-10 NOTE — H&P (Signed)
NAMESADAO, WEYER             ACCOUNT NO.:  [id number]   MEDICAL RECORD NO.:  [id number]          PATIENT TYPE:   LOCATION:                                 FACILITY:   PHYSICIAN:  John L. Rendall, M.D.  DATE OF BIRTH:  1956-09-18   DATE OF ADMISSION:  DATE OF DISCHARGE:                                HISTORY & PHYSICAL   CHIEF COMPLAINT:  Right knee pain.   HISTORY OF PRESENT ILLNESS:  Mr. Huguley is a 55 year old white male with  right knee pain for years.  He has a history of nine knee surgeries and that  included one for right lateral release and another for removal of a plica.  Right knee pain has been severe for the past three years.  He describes the  pain as a throbbing, sharp, tingling pain that is constant.  Mechanical  symptoms are positive for giving way (side catches and painful popping).  No  radiation of pain up or down the leg.  The pain does awaken him at night.  He uses no assisting devices.  X-rays of the right knee show  tricompartmental changes with near bone on bone in the medial compartment.  A right total knee arthroplasty is being performed by Dr. Priscille Kluver at [facility] using computer navigation on August 15, 2004.   ALLERGIES:  None.   MEDICATIONS:  1.  Seroquel.  2.  Depakote.  3.  Wellbutrin.  4.  Vicodin.   PAST MEDICAL HISTORY:  1.  Depression.  2.  Right knee pain.   PAST SURGICAL HISTORY:  1.  Inguinal hernia repair x2.  2.  Right knee surgery x9.  3.  Left shoulder open rotator cuff repair.  4.  Right shoulder scope.   SOCIAL HISTORY:  The patient smokes a pack of cigarettes a day and has done  so for 20 years.  He uses no alcohol.  He is divorced and has two children  who are grown.  The patient lives in a one story home with four steps to the  usual entrance.  He is disabled.   PRIMARY CARE PHYSICIAN:  Corwin Levins, M.D. [facility], phone number [phone].   FAMILY HISTORY:  The patient's mother is alive at age 75.  She  has  emphysema.  She has never smoked, however.  The patient is unsure of his  father's past medical history.  He has one sister who is age 63 and has a  history of thyroid cancer.  He has a half sister.  Maternal grandmother had  a history of MI, diabetes mellitus, and asthma.   REVIEW OF SYSTEMS:  The patient denies any shortness of breath, chest pain,  PND, or orthopnea.  He wears dentures, both  upper and lower.  He has  occasional heartburn.  Otherwise review of systems is negative.  He does  have a living will.  He does not have a power of attorney.   PHYSICAL EXAMINATION:  GENERAL:  The patient is a well-developed, well-  nourished male who walks with an antalgic gait on the right.  The patient's  mood is appropriate.  He talks easily with the examiner.  He seems slightly  nervous throughout the exam.  VITAL SIGNS:  Height is 5 feet 10 inches, weight 185 pounds.  Temperature is  98.4 degrees Fahrenheit, blood pressure is 122/80, pulse 80, respiratory  rate 16.  CARDIAC:  Regular rate and rhythm.  No murmurs, rubs, or gallops noted.  LUNGS:  Clear to auscultation throughout without wheezing, rhonchi, or  rales.  ABDOMEN:  Nontender, soft, no hepatomegaly or splenomegaly.  Bowel sounds x4  quadrants.  NECK:  Carotids are 2+ without bruits bilaterally.  The patient has full  range of motion of cervical spine without pain.  No tenderness with  palpation along the cervical spine.  Trachea is midline.  No  lymphadenopathy.  HEENT:  Head is normocephalic, atraumatic, without frontal or maxillary  sinus tenderness to palpation.  Conjunctivae are pink.  Sclerae are  nonicteric.  PERRLA.  EOMs are intact.  No visible external ear deformity.  TMs pearly and gray bilaterally.  Nose and nasal septum midline.  Nasal  mucosa is pink and moist in the right naris.  The left naris is slightly  boggy.  Buccal mucosa pink and moist.  Pharynx is without erythema or  exudate.  Tongue and uvula  midline.  BACK:  Nontender to palpation of thoracic lumbar spine.  BREASTS/GENITOURINARY/RECTAL:  Deferred at the present time.  NEUROLOGIC:  The patient is alert and oriented x3.  Cranial nerves II-XII  are grossly intact.  Upper and lower extremity strength testing reveals 5/5  strength throughout.  Deep tendon reflexes are 2+ bilaterally upper and  lower extremities.  MUSCULOSKELETAL:  Upper extremities are equal and symmetric for size and  shape.  He has full range of motion of the shoulders, elbows, wrists, and  hands.  Radial pulses are 2+ bilaterally.  Lower extremities the patient has  full range of motion of both hips without pain.  The right knee is 0-105  degrees flexion.  Passive range of motion reveals a moderate amount of  crepitance.  He has pain along the joint line, medial greater than lateral.  He also has pain at the inferior pole of the patella.  The left knee is 0-  134 degrees of flexion.  Valgus and varus stressing reveals no laxity.  Anterior drawer's is negative bilaterally.  No effusion or edema in either  knee bilaterally.  The lower extremity is non-edematous.  Posterior tibial  pulses are 2+ bilaterally.   LABORATORY DATA:  X-rays show tricompartmental changes and near bone on bone  of the medial compartment of the right knee.   IMPRESSION:  1.  Right knee osteoarthritis with bone on bone of the medial compartment.  2.  Depression.   PLAN:  The patient is to be admitted to [facility] on August 15, 2004 to undergo a right total knee arthroplasty by Dr. Priscille Kluver using  computer navigation.  The patient will undergo all preoperative testing and  labs prior to undergoing the surgical procedure.     Sullivan Lone  GC/MEDQ  D:  08/02/2004  T:  08/02/2004  Job:  [id number]

## 2011-03-10 NOTE — Op Note (Signed)
   NAMEJOHNSON, Chase Gibson                       ACCOUNT NO.:  [id number]   MEDICAL RECORD NO.:  [id number]                   PATIENT TYPE:  OIB   LOCATION:  2856                                 FACILITY:  [facility]   PHYSICIAN:  Leonie Man, M.D.                DATE OF BIRTH:  Sep 13, 1956   DATE OF PROCEDURE:  09/17/2002  DATE OF DISCHARGE:                                 OPERATIVE REPORT   PREOPERATIVE DIAGNOSIS:  Lipoma of left parietal scalp.   POSTOPERATIVE DIAGNOSIS:  Epidermoid inclusion cyst of left parietal scalp.   SURGEON:  Leonie Man, M.D.   ASSISTANT:  Nurse.   ANESTHESIA:  General.   INDICATIONS FOR PROCEDURE:  The patient is a 55 year old man who presents  with a mobile soft mass over the left parietal scalp which has been  increasing in size, slowly over the past several years.  He comes to the  operating room  now after the risks and potential benefits of excision of  such mass has been fully discussed with him and all questions answered.   DESCRIPTION OF PROCEDURE:  Following the induction of satisfactory general  anesthesia with the patient positioned supinely and the head and neck turned  to the right, the parietal scalp was prepped and draped to be included in a  sterile operative field.  A transverse incision was carried down over the  mass, deepened through the skin and subcutaneous tissues, down to the  capsule of the mass.  The mass was dissected free on all sides maintaining  hemostasis with electrocautery throughout the course of dissection.  The  mass went down to the parietal muscles of the scalp.  The mass was then  excised in its entirety and forwarded for pathologic evaluation.  Hemostasis  was obtained with electrocautery.  Sponge, instrument and sharp counts were  then verified.  The wound was closed in layers using interrupted 3-0 Vicryl  sutures in the deep layers, running 3-0 Vicryl suture in the mid layer and  running 4-0 Monocryl in the  skin.  This was then covered with collodion and  then reinforced with Steri-Strips.  Anesthetic was reversed and the patient  removed from the operating room to the recovery room in stable condition,  having tolerated the procedure well.                                                 Leonie Man, M.D.    PB/MEDQ  D:  09/17/2002  T:  09/17/2002  Job:  [id number]

## 2011-03-10 NOTE — Op Note (Signed)
NAMEHERMILO, DUTTER             ACCOUNT NO.:  [id number]   MEDICAL RECORD NO.:  [id number]          PATIENT TYPE:  OIB   LOCATION:  5014                         FACILITY:  [facility]   PHYSICIAN:  Gita Kudo, M.D. DATE OF BIRTH:  May 10, 1956   DATE OF PROCEDURE:  07/21/2006  DATE OF DISCHARGE:  07/22/2006                                 OPERATIVE REPORT   OPERATIVE PROCEDURE:  Laparoscopic cholecystectomy with intraoperative  cholangiogram.   SURGEON:  Gita Kudo, M.D.   ASSISTANT:  Velora Heckler, M.D.   ANESTHESIA:  General endotracheal.   PREOPERATIVE DIAGNOSIS:  Cholecystitis.   POSTOPERATIVE DIAGNOSIS:  Cholecystitis plus normal-appearing cholangiogram  distally, no real filling proximally, probably due to the catheter being in  so far.   CLINICAL SUMMARY:  A 55 year old male with acute onset of right upper  quadrant abdominal pain associated with slight fever and elevated white  count.  Liver function studies are normal.   OPERATIVE FINDINGS:  The gallbladder was acutely inflamed and had little  patches of gangrene.  The cystic duct was normal in anatomy, and the  cholangiogram showed good filling distally into the common duct and  duodenum.   OPERATIVE PROCEDURE:  Under satisfactory general endotracheal anesthesia,  having received 1 gram of Ancef preop, the patient was positioned, prepped  and draped in a standard fashion.  A total of 30 mL of 0.5% Marcaine was  infiltrated at skin incision sites for postop analgesia.  Transverse  supraumbilical incision made, carried through the midline into the  peritoneum and controlled with a figure-of-eight 0 Vicryl suture.  Operating  [place] port inserted and secured, and then two #5 ports placed laterally and  a second #10 port medially.  The lateral ports gave excellent exposure, and  I carefully dissected the cystic duct and cystic artery.  When certain of  the anatomy, the artery was controlled with multiple clips  and divided and a  single clip placed on the cystic duct, near the gallbladder, and incision  made in the duct.  A percutaneous catheter placed and a good cholangiogram  obtained.  Catheter then withdrawn and we made certain that the cystic duct  was indeed the cystic duct and then controlled it with multiple clips and  divided it.  Following this the gallbladder was removed from below upward  using coagulating current for both hemostasis and dissection.  The  gallbladder had a hole made in it and had a fair amount of purulent  material.  We stayed close to the gallbladder and actually left a portion of  the posterior wall of the liver bed which we cauterized to remove any  mucosa.  The gallbladder was then placed in an EndoCatch bag and secured.  The operative site was lavaged with 2 liters of saline until the returns  were clear, and then a large Blake drain inserted in the gallbladder bed and  let out the most lateral port site.  The camera then moved up to the upper  incision and through the umbilical incision a large grasper used to retrieve  the gallbladder intact and  without spillage or complication.  Then the  operative sites checked, lavaged and suctioned dry and all ports and CO2  released.   Midline closed with a previous figure-of-eight and a second interrupted 0  Vicryl suture.  Then the upper medial incision fascia closed with a 0 Vicryl  and then subcutaneous approximated with 4-0 Vicryl.  Steri-Strips were used  to approximate the wounds and then sterile absorbent dressings applied.  The  patient tolerated the procedure well and went to the recovery room from the  operating room in good condition without complication.           ______________________________  Gita Kudo, M.D.     MRL/MEDQ  D:  07/21/2006  T:  07/23/2006  Job:  [id number]

## 2011-03-22 ENCOUNTER — Other Ambulatory Visit: Payer: Self-pay

## 2011-03-22 ENCOUNTER — Other Ambulatory Visit: Payer: Self-pay | Admitting: Internal Medicine

## 2011-03-22 DIAGNOSIS — IMO0001 Reserved for inherently not codable concepts without codable children: Secondary | ICD-10-CM

## 2011-03-24 ENCOUNTER — Other Ambulatory Visit (INDEPENDENT_AMBULATORY_CARE_PROVIDER_SITE_OTHER): Payer: Medicare Other

## 2011-03-24 DIAGNOSIS — IMO0001 Reserved for inherently not codable concepts without codable children: Secondary | ICD-10-CM

## 2011-03-24 DIAGNOSIS — E119 Type 2 diabetes mellitus without complications: Secondary | ICD-10-CM

## 2011-03-24 LAB — BASIC METABOLIC PANEL
CO2: 27 mEq/L (ref 19–32)
Calcium: 9.5 mg/dL (ref 8.4–10.5)
Creatinine, Ser: 1 mg/dL (ref 0.4–1.5)
Glucose, Bld: 125 mg/dL — ABNORMAL HIGH (ref 70–99)

## 2011-03-24 LAB — HEMOGLOBIN A1C: Hgb A1c MFr Bld: 7.9 % — ABNORMAL HIGH (ref 4.6–6.5)

## 2011-03-24 LAB — LIPID PANEL
Total CHOL/HDL Ratio: 3
Triglycerides: 151 mg/dL — ABNORMAL HIGH (ref 0.0–149.0)

## 2011-03-26 ENCOUNTER — Encounter: Payer: Self-pay | Admitting: Internal Medicine

## 2011-03-26 DIAGNOSIS — Z7902 Long term (current) use of antithrombotics/antiplatelets: Secondary | ICD-10-CM | POA: Insufficient documentation

## 2011-03-27 ENCOUNTER — Other Ambulatory Visit: Payer: Self-pay | Admitting: Internal Medicine

## 2011-03-29 ENCOUNTER — Encounter: Payer: Self-pay | Admitting: Internal Medicine

## 2011-03-29 ENCOUNTER — Ambulatory Visit (INDEPENDENT_AMBULATORY_CARE_PROVIDER_SITE_OTHER): Payer: Medicare Other | Admitting: Internal Medicine

## 2011-03-29 VITALS — BP 104/60 | HR 77 | Temp 98.5°F | Ht 69.0 in | Wt 227.0 lb

## 2011-03-29 DIAGNOSIS — Z0000 Encounter for general adult medical examination without abnormal findings: Secondary | ICD-10-CM

## 2011-03-29 DIAGNOSIS — R609 Edema, unspecified: Secondary | ICD-10-CM

## 2011-03-29 DIAGNOSIS — F329 Major depressive disorder, single episode, unspecified: Secondary | ICD-10-CM

## 2011-03-29 DIAGNOSIS — E785 Hyperlipidemia, unspecified: Secondary | ICD-10-CM

## 2011-03-29 DIAGNOSIS — E119 Type 2 diabetes mellitus without complications: Secondary | ICD-10-CM

## 2011-03-29 DIAGNOSIS — F3289 Other specified depressive episodes: Secondary | ICD-10-CM

## 2011-03-29 MED ORDER — HYDROCODONE-ACETAMINOPHEN 10-325 MG PO TABS: 1.0000 | ORAL_TABLET | Freq: Four times a day (QID) | ORAL | Status: DC | PRN

## 2011-03-29 MED ORDER — SIMVASTATIN 40 MG PO TABS: 20.0000 mg | ORAL_TABLET | Freq: Every day | ORAL | Status: DC

## 2011-03-29 MED ORDER — GLUCOSE BLOOD VI STRP: ORAL_STRIP | Status: DC

## 2011-03-29 NOTE — Assessment & Plan Note (Signed)
stable overall by hx and exam, most recent data reviewed with pt, and pt to continue medical treatment as before  Lab Results  Component Value Date   WBC 7.5 09/23/2010   HGB 15.3 09/23/2010   HCT 44.5 09/23/2010   PLT 131.0* 09/23/2010   CHOL 85 03/24/2011   TRIG 151.0* 03/24/2011   HDL 27.90* 03/24/2011   LDLDIRECT 64.7 09/23/2010   ALT 30 09/23/2010   AST 42* 09/23/2010   NA 138 03/24/2011   K 4.3 03/24/2011   CL 103 03/24/2011   CREATININE 1.0 03/24/2011   BUN 12 03/24/2011   CO2 27 03/24/2011   TSH 1.58 09/23/2010   PSA 0.28 09/23/2010   HGBA1C 7.9* 03/24/2011   MICROALBUR 0.6 09/23/2010

## 2011-03-29 NOTE — Assessment & Plan Note (Signed)
stable overall by hx and exam, most recent data reviewed with pt, and pt to continue medical treatment as before  Lab Results  Component Value Date   HGBA1C 7.9* 03/24/2011

## 2011-03-29 NOTE — Assessment & Plan Note (Signed)
stable overall by hx and exam, most recent data reviewed with pt, and pt to continue medical treatment as before 

## 2011-03-29 NOTE — Patient Instructions (Addendum)
.  Continue all other medications as before You are given the pain medication refill today You can reduce the simvastatin to 20 mg per day (half of the 40 mg) You will be contacted regarding the referral for: GI - colonoscopy Please return in 1 year for your yearly visit, or sooner if needed, with Lab testing done 3-5 days before

## 2011-03-29 NOTE — Progress Notes (Signed)
Subjective:    Patient ID: Chase Gibson, male    DOB: October 01, 1956, 55 y.o.   MRN: [id number]  HPI Here to f/u; overall doing ok,  Pt denies chest pain, increased sob or doe, wheezing, orthopnea, PND, increased LE swelling, palpitations, dizziness or syncope.  Pt denies new neurological symptoms such as new headache, or facial or extremity weakness or numbness   Pt denies polydipsia, polyuria, or low sugar symptoms such as weakness or confusion improved with po intake.  Pt states overall good compliance with meds, trying to follow lower cholesterol, diabetic diet, wt overall stable but little exercise however  CBG's recently 82 - 132.   Did previously have several low sugars when on glimeparide in addtion the janumet. Denies worsening depressive symptoms, suicidal ideation, or panic, though has ongoing anxiety, not increased recently.  Had cortisone to left knee per ortho recently, still needs norco refill.  Also due for colonoscopy Past Medical History  Diagnosis Date  . ALLERGIC RHINITIS 03/18/2009  . ANXIETY 06/24/2007  . ASTHMA 06/24/2007  . BREAST PAIN, LEFT 07/21/2008  . DEPRESSION 06/24/2007  . DIABETES MELLITUS, TYPE II 07/21/2008  . GERD 08/25/2008  . HYPERLIPIDEMIA 07/21/2008  . INSOMNIA-SLEEP DISORDER-UNSPEC 03/28/2010  . LOW BACK PAIN 09/03/2007  . NAUSEA 06/14/2010  . OSTEOARTHRITIS, KNEES, BILATERAL 08/25/2008  . PERIPHERAL EDEMA 07/21/2008  . SYMPTOM, ABNORMAL INVOLUNTARY MOVEMENT NEC 06/21/2007  . Wheezing 07/13/2009   Past Surgical History  Procedure Date  . Right knee surgury     x 9  . Rotator cuff repair     bilateral  . Inguinal herniorrhapy   . Appendectomy   . Cholecystectomy   . Tonsillectomy     reports that he has been smoking.  He does not have any smokeless tobacco history on file. He reports that he drinks alcohol. He reports that he does not use illicit drugs. family history includes Coronary artery disease in his other. No Known Allergies Current Outpatient  Prescriptions on File Prior to Visit  Medication Sig Dispense Refill  . albuterol (VENTOLIN HFA) 108 (90 BASE) MCG/ACT inhaler Inhale 2 puffs into the lungs 4 (four) times daily.        Marland Kitchen aspirin 81 MG EC tablet Take 81 mg by mouth daily.        . cetirizine (ZYRTEC) 10 MG tablet Take 10 mg by mouth daily.        . citalopram (CELEXA) 10 MG tablet Take 10 mg by mouth daily.        . cyclobenzaprine (FLEXERIL) 5 MG tablet Take 5 mg by mouth 3 (three) times daily as needed.        . divalproex (DEPAKOTE) 250 MG EC tablet Take 250 mg by mouth QID.        . furosemide (LASIX) 20 MG tablet Take 20 mg by mouth daily.        Marland Kitchen HYDROcodone-acetaminophen (NORCO) 10-325 MG per tablet Take 1 tablet by mouth every 6 (six) hours as needed.        Marland Kitchen ipratropium-albuterol (DUONEB) 0.5-2.5 (3) MG/3ML SOLN Take 3 mLs by nebulization 4 (four) times daily as needed.        . Lancets MISC 2 (two) times daily.        . montelukast (SINGULAIR) 10 MG tablet Take 10 mg by mouth at bedtime.        . ONE TOUCH ULTRA TEST test strip USE AS DIRECTED THREE TIMES DAILY  300 each  10  .  simvastatin (ZOCOR) 40 MG tablet TAKE 1 TABLET BY MOUTH EVERY DAY  90 tablet  3  . sitaGLIPtan-metformin (JANUMET) 50-1000 MG per tablet Take 1 tablet by mouth 2 (two) times daily.        . temazepam (RESTORIL) 15 MG capsule Take 15 mg by mouth at bedtime as needed.        . tiotropium (SPIRIVA) 18 MCG inhalation capsule Place 18 mcg into inhaler and inhale daily.        . chlorpheniramine-HYDROcodone (TUSSIONEX) 10-8 MG/5ML LQCR 1 teaspoon by mouth two times a day as needed for cough       . doxycycline (VIBRAMYCIN) 100 MG capsule Take 100 mg by mouth 2 (two) times daily.        . predniSONE (DELTASONE) 10 MG tablet 3 by mouth for 3 days, then 2 by mouth for 3 days, then 1 by mouth for 3 days then stop        Review of Systems All otherwise neg per pt     Objective:   Physical Exam Physical Exam  VS noted Constitutional: Pt appears  well-developed and well-nourished.  HENT: Head: Normocephalic.  Right Ear: External ear normal.  Left Ear: External ear normal.  Eyes: Conjunctivae and EOM are normal. Pupils are equal, round, and reactive to light.  Neck: Normal range of motion. Neck supple.  Cardiovascular: Normal rate and regular rhythm.   Pulmonary/Chest: Effort normal and breath sounds normal.  Abd:  Soft, NT, non-distended, + BS Neurological: Pt is alert. No cranial nerve deficit.  Skin: Skin is warm. No erythema.  Psychiatric: Pt behavior is normal. Thought content normal.  1+ nervous, not depressed appearing       Assessment & Plan:

## 2011-03-29 NOTE — Assessment & Plan Note (Signed)
stable overall by hx and exam, most recent data reviewed with pt, and pt to continue medical treatment as before   [facility] to reduce the zocor to 20 mg

## 2011-04-10 ENCOUNTER — Encounter: Payer: Self-pay | Admitting: Gastroenterology

## 2011-04-11 ENCOUNTER — Other Ambulatory Visit: Payer: Self-pay

## 2011-04-11 MED ORDER — IPRATROPIUM-ALBUTEROL 0.5-2.5 (3) MG/3ML IN SOLN: 3.0000 mL | Freq: Four times a day (QID) | RESPIRATORY_TRACT | Status: DC | PRN

## 2011-04-17 ENCOUNTER — Other Ambulatory Visit: Payer: Self-pay | Admitting: Internal Medicine

## 2011-04-17 ENCOUNTER — Telehealth: Payer: Self-pay | Admitting: Internal Medicine

## 2011-04-17 NOTE — Telephone Encounter (Signed)
Ok for 2 day override this time only

## 2011-04-17 NOTE — Telephone Encounter (Signed)
Defer to his PCP - i am not pt's provider - will forward to [facility]

## 2011-04-18 NOTE — Telephone Encounter (Signed)
Kim at pharmacy advised per MD okay to fill 04/23/2011 for cruise 04/24/2011

## 2011-05-04 ENCOUNTER — Ambulatory Visit (AMBULATORY_SURGERY_CENTER): Payer: Medicare Other | Admitting: *Deleted

## 2011-05-04 ENCOUNTER — Encounter: Payer: Self-pay | Admitting: Gastroenterology

## 2011-05-04 VITALS — Ht 70.0 in | Wt 225.0 lb

## 2011-05-04 DIAGNOSIS — Z1211 Encounter for screening for malignant neoplasm of colon: Secondary | ICD-10-CM

## 2011-05-04 MED ORDER — PEG-KCL-NACL-NASULF-NA ASC-C 100 G PO SOLR: ORAL | Status: DC

## 2011-05-04 NOTE — Progress Notes (Signed)
States he itched after receiving IV contrast during a CT Scan 25 years ago

## 2011-05-06 ENCOUNTER — Other Ambulatory Visit: Payer: Self-pay | Admitting: Internal Medicine

## 2011-05-18 ENCOUNTER — Other Ambulatory Visit: Payer: Medicare Other | Admitting: Gastroenterology

## 2011-05-23 NOTE — Telephone Encounter (Signed)
Too soon, since he was given total 3 mo rx June 6 at last refill

## 2011-05-24 ENCOUNTER — Other Ambulatory Visit: Payer: Medicare Other | Admitting: Gastroenterology

## 2011-06-05 ENCOUNTER — Other Ambulatory Visit: Payer: Self-pay

## 2011-06-05 MED ORDER — HYDROCODONE-ACETAMINOPHEN 10-325 MG PO TABS: 1.0000 | ORAL_TABLET | Freq: Four times a day (QID) | ORAL | Status: DC | PRN

## 2011-06-05 NOTE — Telephone Encounter (Signed)
Pt is requesting to pick up refill Rx this week although Rx is not due until September. Pt will be in at [facility] Thursday for colonoscopy and would like to pick up Rx then.

## 2011-06-06 NOTE — Telephone Encounter (Signed)
Pt informed, Rx in cabinet for pt pick up  

## 2011-06-08 ENCOUNTER — Ambulatory Visit (AMBULATORY_SURGERY_CENTER): Payer: Medicare Other | Admitting: Gastroenterology

## 2011-06-08 ENCOUNTER — Encounter: Payer: Self-pay | Admitting: Gastroenterology

## 2011-06-08 VITALS — BP 105/69 | HR 78 | Temp 98.5°F | Resp 21 | Ht 70.0 in | Wt 225.0 lb

## 2011-06-08 DIAGNOSIS — D126 Benign neoplasm of colon, unspecified: Secondary | ICD-10-CM

## 2011-06-08 DIAGNOSIS — Z1211 Encounter for screening for malignant neoplasm of colon: Secondary | ICD-10-CM

## 2011-06-08 MED ORDER — SODIUM CHLORIDE 0.9 % IV SOLN: 500.0000 mL | INTRAVENOUS | Status: DC

## 2011-06-08 NOTE — Progress Notes (Signed)
AT END OF RECOVERY PERIOD PT COMPLAINING OF LOWER ABDOMINAL PAIN AND CRAMPING. PT PLACED IN TRENDELENBERG AND ENCOURAGED TO BARE DOWN AND PAS MORE AIR. MOVED IN BED TO BACK AND SIDES TO HELP ENCOURAGE AIR PASSAGE. BACK TO LEFT SIDE AND GIVEN LEVSIN 0.125MG  X 2 SL AT 1031AM. EWM  1034 PT PASSING A LARGE AMT OF AIR AND STATES CRAMPING IS IMPROVED SINCE LEVSIN. EWM  PT DISCHARGED WITH NO COMPLAINTS OF PAIN OR DISCOMFORT. AMBULATED TO BATHROOM PRIOR TO DISCHARGE. CONTINUES TO PASS AIR. TOLD TO CALL IF HAS PROBLEMS OR CONCERNS. EWM

## 2011-06-08 NOTE — Patient Instructions (Addendum)
HANDOUT GIVEN ON POLYPS  DISCHARGE INSTRUCTIONS PER BLUE AND GREEN SHEETS  REPEAT EXAM IN 5-10 YEARS PENDING PATHOLOGY REPORT- YOU WILL RECEIVE A LETTER FROM [place] IN 1-2 WEEKS WITH DR Nita Sells RECOMMENDATIONS.  RECOMMENDS PROPOFOL FOR FUTURE PROCEDURES

## 2011-06-09 ENCOUNTER — Telehealth: Payer: Self-pay | Admitting: *Deleted

## 2011-06-09 NOTE — Telephone Encounter (Signed)

## 2011-06-27 ENCOUNTER — Telehealth: Payer: Self-pay

## 2011-06-27 MED ORDER — VARDENAFIL HCL 20 MG PO TABS: 20.0000 mg | ORAL_TABLET | ORAL | Status: DC | PRN

## 2011-06-27 NOTE — Telephone Encounter (Signed)
Pt is willing to try Levitra to pharmacy on file and if it is not covered by Insurance he will transfer Rx to [facility].

## 2011-06-27 NOTE — Telephone Encounter (Signed)
Pt called requesting Rx for Viagra, please advise.

## 2011-06-27 NOTE — Telephone Encounter (Signed)
Please let pt know that Levitra (essentially the same as viagra, very similar) is $9 per pill at [facility] and [facility], whereas price for viagra is often over $20 per pill  Does he still want the viagra, or the levitra?

## 2011-06-27 NOTE — Telephone Encounter (Signed)
Done per emr 

## 2011-06-28 ENCOUNTER — Other Ambulatory Visit: Payer: Self-pay

## 2011-06-28 MED ORDER — VARDENAFIL HCL 20 MG PO TABS: 20.0000 mg | ORAL_TABLET | ORAL | Status: AC | PRN

## 2011-06-28 NOTE — Telephone Encounter (Signed)
Pt called requesting Rx be sent to alternate pharmacy

## 2011-07-05 ENCOUNTER — Other Ambulatory Visit: Payer: Self-pay | Admitting: Internal Medicine

## 2011-07-18 ENCOUNTER — Encounter: Payer: Self-pay | Admitting: Internal Medicine

## 2011-07-18 ENCOUNTER — Ambulatory Visit (INDEPENDENT_AMBULATORY_CARE_PROVIDER_SITE_OTHER): Payer: Medicare Other | Admitting: Internal Medicine

## 2011-07-18 VITALS — BP 100/60 | HR 96 | Temp 98.8°F | Ht 69.0 in | Wt 214.2 lb

## 2011-07-18 DIAGNOSIS — J209 Acute bronchitis, unspecified: Secondary | ICD-10-CM

## 2011-07-18 DIAGNOSIS — R062 Wheezing: Secondary | ICD-10-CM

## 2011-07-18 DIAGNOSIS — Z23 Encounter for immunization: Secondary | ICD-10-CM

## 2011-07-18 DIAGNOSIS — E119 Type 2 diabetes mellitus without complications: Secondary | ICD-10-CM

## 2011-07-18 DIAGNOSIS — H109 Unspecified conjunctivitis: Secondary | ICD-10-CM

## 2011-07-18 MED ORDER — BENZONATATE 100 MG PO CAPS: 100.0000 mg | ORAL_CAPSULE | Freq: Two times a day (BID) | ORAL | Status: AC | PRN

## 2011-07-18 MED ORDER — TOBRAMYCIN 0.3 % OP SOLN: 1.0000 [drp] | Freq: Four times a day (QID) | OPHTHALMIC | Status: AC

## 2011-07-18 MED ORDER — PREDNISONE 10 MG PO TABS: ORAL_TABLET | ORAL | Status: DC

## 2011-07-18 MED ORDER — AZITHROMYCIN 250 MG PO TABS: ORAL_TABLET | ORAL | Status: AC

## 2011-07-18 MED ORDER — METHYLPREDNISOLONE ACETATE 80 MG/ML IJ SUSP
120.0000 mg | Freq: Once | INTRAMUSCULAR | Status: AC
  Administered 2011-07-18: 120 mg via INTRAMUSCULAR

## 2011-07-18 MED ORDER — ALBUTEROL SULFATE HFA 108 (90 BASE) MCG/ACT IN AERS: 1.0000 | INHALATION_SPRAY | Freq: Four times a day (QID) | RESPIRATORY_TRACT | Status: DC | PRN

## 2011-07-18 NOTE — Assessment & Plan Note (Signed)
Mild to mod, for depomedrol and predpack,  to f/u any worsening symptoms or concerns

## 2011-07-18 NOTE — Assessment & Plan Note (Signed)
stable overall by hx and exam, most recent data reviewed with pt, and pt to continue medical treatment as before  Lab Results  Component Value Date   HGBA1C 7.9* 03/24/2011

## 2011-07-18 NOTE — Assessment & Plan Note (Signed)
Mild to mod, for antibx course,  to f/u any worsening symptoms or concerns 

## 2011-07-18 NOTE — Progress Notes (Signed)
  Subjective:    Patient ID: Chase Gibson, male    DOB: [birth year], 55 y.o.   MRN: [id number]  HPI  Also with right eye erythema , matting and d/c for 2 days as well    Review of Systems     Objective:   Physical Exam        Assessment & Plan:

## 2011-07-18 NOTE — Progress Notes (Signed)
Addended by: Corwin Levins on: 07/18/2011 10:00 AM   Modules accepted: Orders

## 2011-07-18 NOTE — Progress Notes (Signed)
Addended by: Scharlene Gloss B on: 07/18/2011 10:59 AM   Modules accepted: Orders

## 2011-07-18 NOTE — Patient Instructions (Addendum)
You had the flu shot, and the steroid shot today Take all new medications as prescribed (sent to your pharmacy) Continue all other medications as before

## 2011-07-18 NOTE — Progress Notes (Signed)
Subjective:    Patient ID: Chase Gibson, male    DOB: 1956/03/10, 55 y.o.   MRN: [id number]  HPIHere with acute onset mild to mod 1 wk ST, HA, general weakness and malaise, with prod cough greenish sputum, but Pt denies chest pain, increased sob or doe, wheezing, orthopnea, PND, increased LE swelling, palpitations, dizziness or syncope, until 2 days worsening mild wheezing/sob as per last yr about this time, requiring albuterol use at home  Pt denies chest pain, increased sob or doe, wheezing, orthopnea, PND, increased LE swelling, palpitations, dizziness or syncope except for the above.  Pt denies new neurological symptoms such as new headache, or facial or extremity weakness or numbness  Pt denies polydipsia, polyuria, or low sugar symptoms such as weakness or confusion improved with po intake.  Pt states overall good compliance with meds, trying to follow lower cholesterol, diabetic diet, wt overall stable but little exercise however.  CBG's now in the lower 100's on the janumet Past Medical History  Diagnosis Date  . ALLERGIC RHINITIS 03/18/2009  . ANXIETY 06/24/2007  . ASTHMA 06/24/2007  . BREAST PAIN, LEFT 07/21/2008  . DEPRESSION 06/24/2007  . DIABETES MELLITUS, TYPE II 07/21/2008  . GERD 08/25/2008  . HYPERLIPIDEMIA 07/21/2008  . INSOMNIA-SLEEP DISORDER-UNSPEC 03/28/2010  . LOW BACK PAIN 09/03/2007  . NAUSEA 06/14/2010  . OSTEOARTHRITIS, KNEES, BILATERAL 08/25/2008  . PERIPHERAL EDEMA 07/21/2008  . SYMPTOM, ABNORMAL INVOLUNTARY MOVEMENT NEC 06/21/2007  . Wheezing 07/13/2009   Past Surgical History  Procedure Date  . Right knee surgury     x 9  . Rotator cuff repair     bilateral  . Inguinal herniorrhapy   . Appendectomy   . Cholecystectomy   . Tonsillectomy   . Total knee arthroplasty     right  . Knee arthroscopy     left    reports that he has been smoking Cigarettes.  He has been smoking about 1 pack per day. He has never used smokeless tobacco. He reports that he does not drink  alcohol or use illicit drugs. family history includes Coronary artery disease in his other. No Known Allergies Current Outpatient Prescriptions on File Prior to Visit  Medication Sig Dispense Refill  . aspirin 81 MG EC tablet Take 81 mg by mouth daily.        . cyclobenzaprine (FLEXERIL) 5 MG tablet Take 5 mg by mouth 3 (three) times daily as needed.        . furosemide (LASIX) 20 MG tablet Take 20 mg by mouth as needed. swelling      . glucose blood (ONE TOUCH ULTRA TEST) test strip Use as instructed  300 each  10  . HYDROcodone-acetaminophen (NORCO) 10-325 MG per tablet Take 1 tablet by mouth every 6 (six) hours as needed.  120 tablet  5  . ipratropium-albuterol (DUONEB) 0.5-2.5 (3) MG/3ML SOLN Take 3 mLs by nebulization 4 (four) times daily as needed.  360 mL  3  . Lancets MISC 2 (two) times daily.        . montelukast (SINGULAIR) 10 MG tablet Take 10 mg by mouth at bedtime.        Marland Kitchen QUEtiapine (SEROQUEL) 100 MG tablet at bedtime as needed.       . simvastatin (ZOCOR) 40 MG tablet Take 0.5 tablets (20 mg total) by mouth at bedtime.  90 tablet  3  . sitaGLIPtan-metformin (JANUMET) 50-1000 MG per tablet Take 1 tablet by mouth 2 (two) times daily.        Marland Kitchen  tiotropium (SPIRIVA) 18 MCG inhalation capsule Place 18 mcg into inhaler and inhale daily.        . vardenafil (LEVITRA) 20 MG tablet Take 1 tablet (20 mg total) by mouth as needed for erectile dysfunction.  10 tablet  5  . VENTOLIN HFA 108 (90 BASE) MCG/ACT inhaler INHALE 2 PUFFS BY MOUTH FOUR TIMES DAILY AS NEEDED  1 Inhaler  11   Review of Systems Review of Systems  Constitutional: Negative for diaphoresis and unexpected weight change.  HENT: Negative for drooling and tinnitus.   Eyes: Negative for photophobia and visual disturbance.  Respiratory: Negative for choking and stridor.   Gastrointestinal: Negative for vomiting and blood in stool.  Genitourinary: Negative for hematuria and decreased urine volume.    Objective:   Physical  Exam BP 100/60  Pulse 96  Temp(Src) 98.8 F (37.1 C) (Oral)  Ht 5\' 9"  (1.753 m)  Wt 214 lb 4 oz (97.183 kg)  BMI 31.64 kg/m2  SpO2 96% Physical Exam  VS noted, mild ill Constitutional: Pt appears well-developed and well-nourished.  HENT: Head: Normocephalic.  Right Ear: External ear normal.  Left Ear: External ear normal.  Bilat tm's mild erythema.  Sinus nontender.  Pharynx mild erythema Eyes: Conjunctivae and EOM are normal. Pupils are equal, round, and reactive to light.  Neck: Normal range of motion. Neck supple.  Cardiovascular: Normal rate and regular rhythm.   Pulmonary/Chest: Effort normal and breath sounds decrease with mild wheezing, no rales  Abd:  Soft, NT, non-distended, + BS Neurological: Pt is alert. No cranial nerve deficit.  Skin: Skin is warm. No erythema.  Psychiatric: Pt behavior is normal. Thought content normal. 1-2+ nervous    Assessment & Plan:

## 2011-07-23 ENCOUNTER — Emergency Department (HOSPITAL_COMMUNITY)
Admission: EM | Admit: 2011-07-23 | Discharge: 2011-07-23 | Discharge status: Against Medical Advice | Payer: Medicare Other | Attending: Emergency Medicine | Admitting: Emergency Medicine

## 2011-07-23 DIAGNOSIS — Z96659 Presence of unspecified artificial knee joint: Secondary | ICD-10-CM | POA: Insufficient documentation

## 2011-07-23 DIAGNOSIS — M545 Low back pain, unspecified: Secondary | ICD-10-CM | POA: Insufficient documentation

## 2011-07-23 DIAGNOSIS — E119 Type 2 diabetes mellitus without complications: Secondary | ICD-10-CM | POA: Insufficient documentation

## 2011-07-23 DIAGNOSIS — R42 Dizziness and giddiness: Secondary | ICD-10-CM | POA: Insufficient documentation

## 2011-07-23 DIAGNOSIS — M549 Dorsalgia, unspecified: Secondary | ICD-10-CM | POA: Insufficient documentation

## 2011-07-23 LAB — POCT I-STAT, CHEM 8
BUN: 10 mg/dL (ref 6–23)
Calcium, Ion: 1.22 mmol/L (ref 1.12–1.32)
Creatinine, Ser: 0.9 mg/dL (ref 0.50–1.35)
TCO2: 25 mmol/L (ref 0–100)

## 2011-07-23 LAB — URINALYSIS, ROUTINE W REFLEX MICROSCOPIC
Bilirubin Urine: NEGATIVE
Glucose, UA: NEGATIVE mg/dL
Hgb urine dipstick: NEGATIVE
Ketones, ur: NEGATIVE mg/dL
Protein, ur: NEGATIVE mg/dL

## 2011-08-21 ENCOUNTER — Telehealth: Payer: Self-pay | Admitting: *Deleted

## 2011-08-21 NOTE — Telephone Encounter (Signed)
Pt left VM requesting refill of abx and prednisone, says symptoms from last OV have not improved and he wanted RF. I advised patient that last OV was over one month ago and he must be seen in the office for f/u. Pt has no transportation and declined apt now but will call back and schedule soon.

## 2011-08-23 ENCOUNTER — Encounter: Payer: Self-pay | Admitting: Internal Medicine

## 2011-08-23 ENCOUNTER — Ambulatory Visit (INDEPENDENT_AMBULATORY_CARE_PROVIDER_SITE_OTHER): Payer: Medicare Other | Admitting: Internal Medicine

## 2011-08-23 VITALS — BP 110/60 | HR 97 | Temp 98.2°F | Ht 70.0 in | Wt 214.4 lb

## 2011-08-23 DIAGNOSIS — E119 Type 2 diabetes mellitus without complications: Secondary | ICD-10-CM

## 2011-08-23 DIAGNOSIS — J209 Acute bronchitis, unspecified: Secondary | ICD-10-CM

## 2011-08-23 DIAGNOSIS — R062 Wheezing: Secondary | ICD-10-CM

## 2011-08-23 MED ORDER — PREDNISONE 10 MG PO TABS: 10.0000 mg | ORAL_TABLET | Freq: Every day | ORAL | Status: AC

## 2011-08-23 MED ORDER — METHYLPREDNISOLONE ACETATE 80 MG/ML IJ SUSP
120.0000 mg | Freq: Once | INTRAMUSCULAR | Status: AC
  Administered 2011-08-23: 120 mg via INTRAMUSCULAR

## 2011-08-23 MED ORDER — HYDROCOD POLST-CHLORPHEN POLST 10-8 MG/5ML PO LQCR: 5.0000 mL | Freq: Two times a day (BID) | ORAL | Status: DC | PRN

## 2011-08-23 MED ORDER — CEFTRIAXONE SODIUM 1 G IJ SOLR
1.0000 g | Freq: Once | INTRAMUSCULAR | Status: AC
  Administered 2011-08-23: 1 g via INTRAMUSCULAR

## 2011-08-23 MED ORDER — LEVOFLOXACIN 500 MG PO TABS
500.0000 mg | ORAL_TABLET | Freq: Every day | ORAL | Status: AC
Start: 2011-08-23 — End: 2011-09-02

## 2011-08-23 NOTE — Assessment & Plan Note (Signed)
Recurrent, cant r/o pna, overall worse than last presentation, oxygenation good, for rocephin 1 gm, and levaquin course,  to f/u any worsening symptoms or concerns

## 2011-08-23 NOTE — Patient Instructions (Signed)
You had the steroid and the antibiotic shots today (depomedrol, and rocephin) Take all new medications as prescribed Continue all other medications as before

## 2011-08-24 ENCOUNTER — Encounter: Payer: Self-pay | Admitting: Internal Medicine

## 2011-08-24 NOTE — Progress Notes (Signed)
Subjective:    Patient ID: Chase Gibson, male    DOB: [birth year], 55 y.o.   MRN: [id number]  HPI  Here with acute onset mild to mod 2-3 days ST, HA, general weakness and malaise, with prod cough greenish sputum, but Pt denies chest pain, increased sob or doe, wheezing, orthopnea, PND, increased LE swelling, palpitations, dizziness or syncope, except for mild wheezing onset today wih mild sob.  Pt denies new neurological symptoms such as new headache, or facial or extremity weakness or numbness   Pt denies polydipsia, polyuria, or low sugar symptoms such as weakness or confusion improved with po intake.  Pt states overall good compliance with meds, trying to follow lower cholesterol, diabetic diet, wt overall stable but little exercise however.    Past Medical History  Diagnosis Date  . ALLERGIC RHINITIS 03/18/2009  . ANXIETY 06/24/2007  . ASTHMA 06/24/2007  . BREAST PAIN, LEFT 07/21/2008  . DEPRESSION 06/24/2007  . DIABETES MELLITUS, TYPE II 07/21/2008  . GERD 08/25/2008  . HYPERLIPIDEMIA 07/21/2008  . INSOMNIA-SLEEP DISORDER-UNSPEC 03/28/2010  . LOW BACK PAIN 09/03/2007  . NAUSEA 06/14/2010  . OSTEOARTHRITIS, KNEES, BILATERAL 08/25/2008  . PERIPHERAL EDEMA 07/21/2008  . SYMPTOM, ABNORMAL INVOLUNTARY MOVEMENT NEC 06/21/2007  . Wheezing 07/13/2009   Past Surgical History  Procedure Date  . Right knee surgury     x 9  . Rotator cuff repair     bilateral  . Inguinal herniorrhapy   . Appendectomy   . Cholecystectomy   . Tonsillectomy   . Total knee arthroplasty     right  . Knee arthroscopy     left    reports that he has been smoking Cigarettes.  He has been smoking about 1 pack per day. He has never used smokeless tobacco. He reports that he does not drink alcohol or use illicit drugs. family history includes Coronary artery disease in his other. No Known Allergies Current Outpatient Prescriptions on File Prior to Visit  Medication Sig Dispense Refill  . albuterol (VENTOLIN HFA) 108 (90  BASE) MCG/ACT inhaler Inhale 1 puff into the lungs every 6 (six) hours as needed for wheezing.  1 Inhaler  11  . aspirin 81 MG EC tablet Take 81 mg by mouth daily.        . cyclobenzaprine (FLEXERIL) 5 MG tablet Take 5 mg by mouth 3 (three) times daily as needed.        . furosemide (LASIX) 20 MG tablet Take 20 mg by mouth as needed. swelling      . glucose blood (ONE TOUCH ULTRA TEST) test strip Use as instructed  300 each  10  . HYDROcodone-acetaminophen (NORCO) 10-325 MG per tablet Take 1 tablet by mouth every 6 (six) hours as needed.  120 tablet  5  . ipratropium-albuterol (DUONEB) 0.5-2.5 (3) MG/3ML SOLN Take 3 mLs by nebulization 4 (four) times daily as needed.  360 mL  3  . Lancets MISC 2 (two) times daily.        . montelukast (SINGULAIR) 10 MG tablet Take 10 mg by mouth at bedtime.        . predniSONE (DELTASONE) 10 MG tablet 3 tabs by mouth per day for 3 days,2 tabs per day for 3 days 1 tab per day for 3 days  18 tablet  0  . QUEtiapine (SEROQUEL) 100 MG tablet at bedtime as needed.       . simvastatin (ZOCOR) 40 MG tablet Take 0.5 tablets (20 mg total) by mouth  at bedtime.  90 tablet  3  . sitaGLIPtan-metformin (JANUMET) 50-1000 MG per tablet Take 1 tablet by mouth 2 (two) times daily.        Marland Kitchen tiotropium (SPIRIVA) 18 MCG inhalation capsule Place 18 mcg into inhaler and inhale daily.         No current facility-administered medications on file prior to visit.   Review of Systems Review of Systems  Constitutional: Negative for diaphoresis and unexpected weight change.  HENT: Negative for drooling and tinnitus.   Eyes: Negative for photophobia and visual disturbance.  Respiratory: Negative for choking and stridor.   Gastrointestinal: Negative for vomiting and blood in stool.  Genitourinary: Negative for hematuria and decreased urine volume.      Objective:   Physical Exam BP 110/60  Pulse 97  Temp(Src) 98.2 F (36.8 C) (Oral)  Ht 5\' 10"  (1.778 m)  Wt 214 lb 6 oz (97.24 kg)   BMI 30.76 kg/m2  SpO2 95% Physical Exam  VS noted, mild ill Constitutional: Pt appears well-developed and well-nourished.  HENT: Head: Normocephalic.  Right Ear: External ear normal.  Left Ear: External ear normal.  Bilat tm's mild erythema.  Sinus nontender.  Pharynx mild erythema Eyes: Conjunctivae and EOM are normal. Pupils are equal, round, and reactive to light.  Neck: Normal range of motion. Neck supple.  Cardiovascular: Normal rate and regular rhythm.   Pulmonary/Chest: Effort normal and breath sounds mild decreased/mild wheeze, no rale.  Neurological: Pt is alert. No cranial nerve deficit.  Skin: Skin is warm. No erythema.  Psychiatric: Pt behavior is normal. Thought content normal.  Assessment & Plan:

## 2011-08-24 NOTE — Assessment & Plan Note (Signed)
Mild to mod, for predpack,  to f/u any worsening symptoms or concerns, adn depomedrol today

## 2011-08-24 NOTE — Assessment & Plan Note (Signed)
stable overall by hx and exam, most recent data reviewed with pt, and pt to continue medical treatment as before,  Lab Results  Component Value Date   HGBA1C 7.9* 03/24/2011   Pt advised about risk of mild increased sugar on steroid, to call for > 200

## 2011-09-22 ENCOUNTER — Other Ambulatory Visit (INDEPENDENT_AMBULATORY_CARE_PROVIDER_SITE_OTHER): Payer: Medicare Other

## 2011-09-22 DIAGNOSIS — Z0000 Encounter for general adult medical examination without abnormal findings: Secondary | ICD-10-CM

## 2011-09-22 DIAGNOSIS — E119 Type 2 diabetes mellitus without complications: Secondary | ICD-10-CM

## 2011-09-22 DIAGNOSIS — Z125 Encounter for screening for malignant neoplasm of prostate: Secondary | ICD-10-CM

## 2011-09-22 LAB — CBC WITH DIFFERENTIAL/PLATELET
Eosinophils Relative: 1 % (ref 0.0–5.0)
Monocytes Absolute: 0.5 10*3/uL (ref 0.1–1.0)
Monocytes Relative: 7.2 % (ref 3.0–12.0)
Neutrophils Relative %: 50.3 % (ref 43.0–77.0)
Platelets: 127 10*3/uL — ABNORMAL LOW (ref 150.0–400.0)
WBC: 6.9 10*3/uL (ref 4.5–10.5)

## 2011-09-22 LAB — BASIC METABOLIC PANEL
BUN: 12 mg/dL (ref 6–23)
Chloride: 103 mEq/L (ref 96–112)
Glucose, Bld: 174 mg/dL — ABNORMAL HIGH (ref 70–99)
Potassium: 4.2 mEq/L (ref 3.5–5.1)

## 2011-09-22 LAB — HEPATIC FUNCTION PANEL
ALT: 60 U/L — ABNORMAL HIGH (ref 0–53)
AST: 37 U/L (ref 0–37)
Albumin: 4.1 g/dL (ref 3.5–5.2)

## 2011-09-22 LAB — TSH: TSH: 1 u[IU]/mL (ref 0.35–5.50)

## 2011-09-22 LAB — LIPID PANEL: Cholesterol: 124 mg/dL (ref 0–200)

## 2011-09-28 ENCOUNTER — Ambulatory Visit (INDEPENDENT_AMBULATORY_CARE_PROVIDER_SITE_OTHER): Payer: Medicare Other | Admitting: Internal Medicine

## 2011-09-28 ENCOUNTER — Encounter: Payer: Self-pay | Admitting: Internal Medicine

## 2011-09-28 VITALS — BP 120/62 | HR 87 | Temp 98.4°F | Wt 216.4 lb

## 2011-09-28 DIAGNOSIS — Z0000 Encounter for general adult medical examination without abnormal findings: Secondary | ICD-10-CM

## 2011-09-28 DIAGNOSIS — E119 Type 2 diabetes mellitus without complications: Secondary | ICD-10-CM

## 2011-09-28 MED ORDER — MONTELUKAST SODIUM 10 MG PO TABS: 10.0000 mg | ORAL_TABLET | Freq: Every day | ORAL | Status: DC

## 2011-09-28 MED ORDER — SITAGLIPTIN PHOS-METFORMIN HCL 50-1000 MG PO TABS: 1.0000 | ORAL_TABLET | Freq: Two times a day (BID) | ORAL | Status: DC

## 2011-09-28 MED ORDER — HYDROCODONE-ACETAMINOPHEN 10-325 MG PO TABS: 1.0000 | ORAL_TABLET | Freq: Four times a day (QID) | ORAL | Status: DC | PRN

## 2011-09-28 NOTE — Progress Notes (Signed)
Subjective:    Patient ID: Chase Gibson, male    DOB: Apr 30, 1956, 55 y.o.   MRN: [id number]  HPI  Here for wellness and f/u;  Overall doing ok;  Pt denies CP, worsening SOB, DOE, wheezing, orthopnea, PND, worsening LE edema, palpitations, dizziness or syncope.  Pt denies neurological change such as new Headache, facial or extremity weakness.  Pt denies polydipsia, polyuria, or low sugar symptoms. Pt states overall good compliance with treatment and medications, good tolerability, and trying to follow lower cholesterol diet.  Pt denies worsening depressive symptoms, suicidal ideation or panic. No fever, wt loss, night sweats, loss of appetite, or other constitutional symptoms.  Pt states good ability with ADL's, low fall risk, home safety reviewed and adequate, no significant changes in hearing or vision, and occasionally active with exercise.  Goes to GYM for 2 miles per day and some wt lifting most days of the wk - plans to do more and lose wt and doesnot want more medication.  Does still have som persistent cough after last visit Past Medical History  Diagnosis Date  . ALLERGIC RHINITIS 03/18/2009  . ANXIETY 06/24/2007  . ASTHMA 06/24/2007  . BREAST PAIN, LEFT 07/21/2008  . DEPRESSION 06/24/2007  . DIABETES MELLITUS, TYPE II 07/21/2008  . GERD 08/25/2008  . HYPERLIPIDEMIA 07/21/2008  . INSOMNIA-SLEEP DISORDER-UNSPEC 03/28/2010  . LOW BACK PAIN 09/03/2007  . NAUSEA 06/14/2010  . OSTEOARTHRITIS, KNEES, BILATERAL 08/25/2008  . PERIPHERAL EDEMA 07/21/2008  . SYMPTOM, ABNORMAL INVOLUNTARY MOVEMENT NEC 06/21/2007  . Wheezing 07/13/2009   Past Surgical History  Procedure Date  . Right knee surgury     x 9  . Rotator cuff repair     bilateral  . Inguinal herniorrhapy   . Appendectomy   . Cholecystectomy   . Tonsillectomy   . Total knee arthroplasty     right  . Knee arthroscopy     left    reports that he has been smoking Cigarettes.  He has been smoking about 1 pack per day. He has never used  smokeless tobacco. He reports that he does not drink alcohol or use illicit drugs. family history includes Coronary artery disease in his other. No Known Allergies Current Outpatient Prescriptions on File Prior to Visit  Medication Sig Dispense Refill  . albuterol (VENTOLIN HFA) 108 (90 BASE) MCG/ACT inhaler Inhale 1 puff into the lungs every 6 (six) hours as needed for wheezing.  1 Inhaler  11  . aspirin 81 MG EC tablet Take 81 mg by mouth daily.        . cyclobenzaprine (FLEXERIL) 5 MG tablet Take 5 mg by mouth 3 (three) times daily as needed.        . furosemide (LASIX) 20 MG tablet Take 20 mg by mouth as needed. swelling      . glucose blood (ONE TOUCH ULTRA TEST) test strip Use as instructed  300 each  10  . ipratropium-albuterol (DUONEB) 0.5-2.5 (3) MG/3ML SOLN Take 3 mLs by nebulization 4 (four) times daily as needed.  360 mL  3  . Lancets MISC 2 (two) times daily.        . simvastatin (ZOCOR) 40 MG tablet Take 0.5 tablets (20 mg total) by mouth at bedtime.  90 tablet  3  . tiotropium (SPIRIVA) 18 MCG inhalation capsule Place 18 mcg into inhaler and inhale daily.         Review of Systems Review of Systems  Constitutional: Negative for diaphoresis, activity change, appetite change  and unexpected weight change.  HENT: Negative for hearing loss, ear pain, facial swelling, mouth sores and neck stiffness.   Eyes: Negative for pain, redness and visual disturbance.  Respiratory: Negative for shortness of breath and wheezing.   Cardiovascular: Negative for chest pain and palpitations.  Gastrointestinal: Negative for diarrhea, blood in stool, abdominal distention and rectal pain.  Genitourinary: Negative for hematuria, flank pain and decreased urine volume.  Musculoskeletal: Negative for myalgias and joint swelling.  Skin: Negative for color change and wound.  Neurological: Negative for syncope and numbness.  Hematological: Negative for adenopathy.  Psychiatric/Behavioral: Negative for  hallucinations, self-injury, decreased concentration and agitation.      Objective:   Physical Exam BP 120/62  Pulse 87  Temp(Src) 98.4 F (36.9 C) (Oral)  Wt 216 lb 6.4 oz (98.158 kg)  SpO2 96% Physical Exam  VS noted, not ill appearing Constitutional: Pt is oriented to person, place, and time. Appears well-developed and well-nourished.  HENT:  Head: Normocephalic and atraumatic.  Right Ear: External ear normal.  Left Ear: External ear normal.  Nose: Nose normal.  Mouth/Throat: Oropharynx is clear and moist.  Eyes: Conjunctivae and EOM are normal. Pupils are equal, round, and reactive to light.  Neck: Normal range of motion. Neck supple. No JVD present. No tracheal deviation present.  Cardiovascular: Normal rate, regular rhythm, normal heart sounds and intact distal pulses.   Pulmonary/Chest: Effort normal and breath sounds normal.  Abdominal: Soft. Bowel sounds are normal. There is no tenderness.  Musculoskeletal: Normal range of motion. Exhibits no edema.  Lymphadenopathy:  Has no cervical adenopathy.  Neurological: Pt is alert and oriented to person, place, and time. Pt has normal reflexes. No cranial nerve deficit.  Skin: Skin is warm and dry. No rash noted.  Psychiatric:  Has  normal mood and affect. Behavior is normal.     Assessment & Plan:

## 2011-09-28 NOTE — Patient Instructions (Signed)
Continue all other medications as before You are up to date with prevention Please return in 6 mo with Lab testing done 3-5 days before

## 2011-09-28 NOTE — Assessment & Plan Note (Signed)
stable overall by hx and exam, most recent data reviewed with pt, and pt to continue medical treatment as before  For labs next visit

## 2011-10-01 ENCOUNTER — Encounter: Payer: Self-pay | Admitting: Internal Medicine

## 2011-10-01 NOTE — Assessment & Plan Note (Signed)

## 2011-10-22 ENCOUNTER — Other Ambulatory Visit: Payer: Self-pay | Admitting: Internal Medicine

## 2011-11-21 ENCOUNTER — Telehealth: Payer: Self-pay

## 2011-11-21 ENCOUNTER — Telehealth: Payer: Self-pay | Admitting: *Deleted

## 2011-11-21 MED ORDER — GLUCOSE BLOOD VI STRP: ORAL_STRIP | Status: DC

## 2011-11-21 NOTE — Telephone Encounter (Signed)
Pharmacy called to inform they could not fill #900 of test strips due to cost. They could only do #300 for a 75 day supply. Called the patient to inform left message to call back

## 2011-11-21 NOTE — Telephone Encounter (Signed)
Left msg on vm needing a 90 day supply sent in on his blood sugar strips. MD usally send in #300 and that would last for 3 months. Notified pt will send renewal to pharmacy....11/21/11@1 :30pm/LMB

## 2011-11-22 NOTE — Telephone Encounter (Signed)
Patient informed. 

## 2012-01-17 ENCOUNTER — Ambulatory Visit (INDEPENDENT_AMBULATORY_CARE_PROVIDER_SITE_OTHER)
Admission: RE | Admit: 2012-01-17 | Discharge: 2012-01-17 | Disposition: A | Discharge status: Home / Self Care | Payer: Medicare Other | Source: Ambulatory Visit | Attending: Internal Medicine | Admitting: Internal Medicine

## 2012-01-17 ENCOUNTER — Encounter: Payer: Self-pay | Admitting: Internal Medicine

## 2012-01-17 ENCOUNTER — Ambulatory Visit (INDEPENDENT_AMBULATORY_CARE_PROVIDER_SITE_OTHER): Payer: Medicare Other | Admitting: Internal Medicine

## 2012-01-17 VITALS — BP 122/82 | HR 83 | Temp 98.3°F | Ht 70.0 in | Wt 214.2 lb

## 2012-01-17 DIAGNOSIS — R079 Chest pain, unspecified: Secondary | ICD-10-CM

## 2012-01-17 DIAGNOSIS — J209 Acute bronchitis, unspecified: Secondary | ICD-10-CM

## 2012-01-17 DIAGNOSIS — R062 Wheezing: Secondary | ICD-10-CM

## 2012-01-17 MED ORDER — KETOROLAC TROMETHAMINE 30 MG/ML IJ SOLN
30.0000 mg | Freq: Once | INTRAMUSCULAR | Status: AC
  Administered 2012-01-17: 30 mg via INTRAVENOUS

## 2012-01-17 MED ORDER — METHYLPREDNISOLONE ACETATE 80 MG/ML IJ SUSP: 120.0000 mg | Freq: Once | INTRAMUSCULAR | Status: DC

## 2012-01-17 MED ORDER — METHYLPREDNISOLONE ACETATE 40 MG/ML IJ SUSP: 40.0000 mg | Freq: Once | INTRAMUSCULAR | Status: DC

## 2012-01-17 MED ORDER — HYDROCODONE-HOMATROPINE 5-1.5 MG/5ML PO SYRP: 5.0000 mL | ORAL_SOLUTION | Freq: Four times a day (QID) | ORAL | Status: AC | PRN

## 2012-01-17 MED ORDER — METHYLPREDNISOLONE ACETATE 80 MG/ML IJ SUSP
120.0000 mg | Freq: Once | INTRAMUSCULAR | Status: AC
  Administered 2012-01-17: 120 mg via INTRAMUSCULAR

## 2012-01-17 MED ORDER — PREDNISONE 10 MG PO TABS: ORAL_TABLET | ORAL | Status: DC

## 2012-01-17 MED ORDER — LEVOFLOXACIN 500 MG PO TABS: 500.0000 mg | ORAL_TABLET | Freq: Every day | ORAL | Status: AC

## 2012-01-17 NOTE — Assessment & Plan Note (Signed)
Mild to mod, for antibx course,  to f/u any worsening symptoms or concerns, cant r/o pna with few LLL rales and pleuritic pain to the left post chest/side - for cxr

## 2012-01-17 NOTE — Patient Instructions (Signed)
Take all new medications as prescribed - the cough medicine, levaquin antibiotic, and prednisone You had the steroid and pain shots today (depomedrol and toradol) Continue all other medications as before Please go to XRAY in the Basement for the x-ray test You will be contacted by phone if any changes need to be made immediately.  Otherwise, you will receive a letter about your results with an explanation.

## 2012-01-17 NOTE — Assessment & Plan Note (Signed)
Mild to mod, for depomedrol IM,and predpack asd,  to f/u any worsening symptoms or concerns 

## 2012-01-19 ENCOUNTER — Encounter: Payer: Self-pay | Admitting: Internal Medicine

## 2012-01-19 NOTE — Progress Notes (Signed)
Subjective:    Patient ID: Chase Gibson, male    DOB: 06-26-1956, 56 y.o.   MRN: [id number]  HPI  Here with acute onset mild to mod 10 days ST, HA, general weakness and malaise, with prod cough greenish sputum, but Pt denies increased sob or doe, wheezing, orthopnea, PND, increased LE swelling, palpitations, dizziness or syncope, except for worsening mild wheezing yesterday, and onset pleuritic type pain to left chest and left back, feels like a knife near left shoulder blade, worse to deep breaths and cough, nothing makes better.   Pt denies polydipsia, polyuria. Pt denies new neurological symptoms such as new headache, or facial or extremity weakness or numbness Past Medical History  Diagnosis Date  . ALLERGIC RHINITIS 03/18/2009  . ANXIETY 06/24/2007  . ASTHMA 06/24/2007  . BREAST PAIN, LEFT 07/21/2008  . DEPRESSION 06/24/2007  . DIABETES MELLITUS, TYPE II 07/21/2008  . GERD 08/25/2008  . HYPERLIPIDEMIA 07/21/2008  . INSOMNIA-SLEEP DISORDER-UNSPEC 03/28/2010  . LOW BACK PAIN 09/03/2007  . NAUSEA 06/14/2010  . OSTEOARTHRITIS, KNEES, BILATERAL 08/25/2008  . PERIPHERAL EDEMA 07/21/2008  . SYMPTOM, ABNORMAL INVOLUNTARY MOVEMENT NEC 06/21/2007  . Wheezing 07/13/2009   Past Surgical History  Procedure Date  . Right knee surgury     x 9  . Rotator cuff repair     bilateral  . Inguinal herniorrhapy   . Appendectomy   . Cholecystectomy   . Tonsillectomy   . Total knee arthroplasty     right  . Knee arthroscopy     left    reports that he has been smoking Cigarettes.  He has been smoking about 1 pack per day. He has never used smokeless tobacco. He reports that he does not drink alcohol or use illicit drugs. family history includes Coronary artery disease in his other. No Known Allergies Current Outpatient Prescriptions on File Prior to Visit  Medication Sig Dispense Refill  . albuterol (VENTOLIN HFA) 108 (90 BASE) MCG/ACT inhaler Inhale 1 puff into the lungs every 6 (six) hours as needed for  wheezing.  1 Inhaler  11  . aspirin 81 MG EC tablet Take 81 mg by mouth daily.        . cyclobenzaprine (FLEXERIL) 5 MG tablet TAKE 1 TABLET BY MOUTH THREE TIMES DAILY AS NEEDED  60 tablet  5  . furosemide (LASIX) 20 MG tablet Take 20 mg by mouth as needed. swelling      . glucose blood (ONE TOUCH ULTRA TEST) test strip Use as instructed  900 each  2  . HYDROcodone-acetaminophen (NORCO) 10-325 MG per tablet Take 1 tablet by mouth every 6 (six) hours as needed.  120 tablet  5  . ipratropium-albuterol (DUONEB) 0.5-2.5 (3) MG/3ML SOLN Take 3 mLs by nebulization 4 (four) times daily as needed.  360 mL  3  . Lancets MISC 2 (two) times daily.        . montelukast (SINGULAIR) 10 MG tablet Take 1 tablet (10 mg total) by mouth at bedtime.  90 tablet  3  . simvastatin (ZOCOR) 40 MG tablet Take 0.5 tablets (20 mg total) by mouth at bedtime.  90 tablet  3  . sitaGLIPtan-metformin (JANUMET) 50-1000 MG per tablet Take 1 tablet by mouth 2 (two) times daily.  180 tablet  3  . tiotropium (SPIRIVA) 18 MCG inhalation capsule Place 18 mcg into inhaler and inhale daily.         Review of Systems Review of Systems  Constitutional: Negative for diaphoresis and unexpected  weight change.  Respiratory: Negative for choking and stridor.   Gastrointestinal: Negative for vomiting and blood in stool.  Genitourinary: Negative for hematuria and decreased urine volume.  Musculoskeletal: Negative for gait problem.  Skin: Negative for color change and wound.  Neurological: Negative for tremors and numbness.    Objective:   Physical Exam BP 122/82  Pulse 83  Temp(Src) 98.3 F (36.8 C) (Oral)  Ht 5\' 10"  (1.778 m)  Wt 214 lb 4 oz (97.183 kg)  BMI 30.74 kg/m2  SpO2 95% Physical Exam  VS noted, mild ill Constitutional: Pt appears well-developed and well-nourished.  HENT: Head: Normocephalic.  Right Ear: External ear normal.  Left Ear: External ear normal.  Bilat tm's mild erythema.  Sinus nontender.  Pharynx mild  erythema Eyes: Conjunctivae and EOM are normal. Pupils are equal, round, and reactive to light.  Neck: Normal range of motion. Neck supple.  Cardiovascular: Normal rate and regular rhythm.   Pulmonary/Chest: Effort normal and breath sounds mild decreased, mild wheezing.  Neurological: Pt is alert. Not confused Skin: Skin is warm. No erythema.  Psychiatric: Pt behavior is normal. Thought content normal.     Assessment & Plan:

## 2012-01-19 NOTE — Assessment & Plan Note (Signed)
?   MSK vs pleurisy vs parapneumonic or other, for cxr,  to f/u any worsening symptoms or concerns

## 2012-01-29 ENCOUNTER — Telehealth: Payer: Self-pay

## 2012-01-29 DIAGNOSIS — R062 Wheezing: Secondary | ICD-10-CM

## 2012-01-29 MED ORDER — AZITHROMYCIN 250 MG PO TABS: ORAL_TABLET | ORAL | Status: AC

## 2012-01-29 NOTE — Telephone Encounter (Signed)
Pt called stating that Levaquin ABX has not helped with sxs, he is still experiencing congestions, mild SOB and wheezing. Pt is requesting another ABX, please advise.

## 2012-01-29 NOTE — Telephone Encounter (Signed)
Patient informed. 

## 2012-01-29 NOTE — Telephone Encounter (Signed)
Done per emr 

## 2012-03-25 ENCOUNTER — Other Ambulatory Visit (INDEPENDENT_AMBULATORY_CARE_PROVIDER_SITE_OTHER): Payer: Medicare Other

## 2012-03-25 DIAGNOSIS — IMO0001 Reserved for inherently not codable concepts without codable children: Secondary | ICD-10-CM

## 2012-03-25 DIAGNOSIS — E119 Type 2 diabetes mellitus without complications: Secondary | ICD-10-CM

## 2012-03-25 LAB — BASIC METABOLIC PANEL
BUN: 10 mg/dL (ref 6–23)
Chloride: 103 mEq/L (ref 96–112)
Creatinine, Ser: 1.1 mg/dL (ref 0.4–1.5)
Sodium: 140 mEq/L (ref 135–145)

## 2012-03-25 LAB — LIPID PANEL
Cholesterol: 101 mg/dL (ref 0–200)
HDL: 35.9 mg/dL — ABNORMAL LOW (ref >39.00)
LDL Cholesterol: 38 mg/dL (ref 0–99)
Triglycerides: 137 mg/dL (ref 0.0–149.0)
VLDL: 27.4 mg/dL (ref 0.0–40.0)

## 2012-03-25 LAB — HEMOGLOBIN A1C: Hgb A1c MFr Bld: 6.7 % — ABNORMAL HIGH (ref 4.6–6.5)

## 2012-03-28 ENCOUNTER — Ambulatory Visit (INDEPENDENT_AMBULATORY_CARE_PROVIDER_SITE_OTHER): Payer: Medicare Other | Admitting: Internal Medicine

## 2012-03-28 ENCOUNTER — Encounter: Payer: Self-pay | Admitting: Internal Medicine

## 2012-03-28 VITALS — BP 118/78 | HR 82 | Temp 98.0°F | Ht 70.0 in | Wt 214.5 lb

## 2012-03-28 DIAGNOSIS — Z0000 Encounter for general adult medical examination without abnormal findings: Secondary | ICD-10-CM

## 2012-03-28 DIAGNOSIS — K635 Polyp of colon: Secondary | ICD-10-CM

## 2012-03-28 DIAGNOSIS — E785 Hyperlipidemia, unspecified: Secondary | ICD-10-CM

## 2012-03-28 DIAGNOSIS — E119 Type 2 diabetes mellitus without complications: Secondary | ICD-10-CM

## 2012-03-28 DIAGNOSIS — K625 Hemorrhage of anus and rectum: Secondary | ICD-10-CM

## 2012-03-28 DIAGNOSIS — J449 Chronic obstructive pulmonary disease, unspecified: Secondary | ICD-10-CM

## 2012-03-28 DIAGNOSIS — J441 Chronic obstructive pulmonary disease with (acute) exacerbation: Secondary | ICD-10-CM

## 2012-03-28 DIAGNOSIS — M545 Low back pain, unspecified: Secondary | ICD-10-CM

## 2012-03-28 DIAGNOSIS — R062 Wheezing: Secondary | ICD-10-CM

## 2012-03-28 HISTORY — DX: Chronic obstructive pulmonary disease, unspecified: J44.9

## 2012-03-28 HISTORY — DX: Polyp of colon: K63.5

## 2012-03-28 MED ORDER — IPRATROPIUM-ALBUTEROL 0.5-2.5 (3) MG/3ML IN SOLN: 3.0000 mL | Freq: Four times a day (QID) | RESPIRATORY_TRACT | Status: DC | PRN

## 2012-03-28 MED ORDER — SIMVASTATIN 40 MG PO TABS: 20.0000 mg | ORAL_TABLET | Freq: Every day | ORAL | Status: DC

## 2012-03-28 MED ORDER — HYDROCODONE-ACETAMINOPHEN 10-325 MG PO TABS: 1.0000 | ORAL_TABLET | Freq: Four times a day (QID) | ORAL | Status: DC | PRN

## 2012-03-28 MED ORDER — PREDNISONE 10 MG PO TABS: ORAL_TABLET | ORAL | Status: DC

## 2012-03-28 MED ORDER — FLUTICASONE PROPIONATE (INHAL) 100 MCG/BLIST IN AEPB: 1.0000 | INHALATION_SPRAY | Freq: Two times a day (BID) | RESPIRATORY_TRACT | Status: DC

## 2012-03-28 NOTE — Assessment & Plan Note (Signed)
Twice smal volume in last 2 mo, no pain, benign exam, to Continue all other medications as before.  to f/u any worsening symptoms or concerns

## 2012-03-28 NOTE — Progress Notes (Signed)
Subjective:    Patient ID: Chase Gibson, male    DOB: [birth year], 56 y.o.   MRN: [id number]  HPI  Here to f/u; overall doing ok,  Pt denies chest pain, increased sob or doe,  orthopnea, PND, increased LE swelling, palpitations, dizziness or syncope, but has has midl worsening baseline wheezing which can wake him up 3-4 times per wk.   Pt denies new neurological symptoms such as new headache, or facial or extremity weakness or numbness   Pt denies polydipsia, polyuria, or low sugar symptoms such as weakness or confusion improved with po intake.  Pt states overall good compliance with meds, trying to follow lower cholesterol, diabetic diet, wt overall stable but little exercise however  CBG' to 85 to 135 range.  Had 2 minor small volume limited BRBPR last 2 mo.  Denies worsening reflux, dysphagia, abd pain, n/v, bowel change.   Last colonscopy aug 2012  Pt continues to have recurring LBP without change in severity, bowel or bladder change, fever, wt loss,  worsening LE pain/numbness/weakness, gait change or falls. Past Medical History  Diagnosis Date  . ALLERGIC RHINITIS 03/18/2009  . ANXIETY 06/24/2007  . ASTHMA 06/24/2007  . BREAST PAIN, LEFT 07/21/2008  . DEPRESSION 06/24/2007  . DIABETES MELLITUS, TYPE II 07/21/2008  . GERD 08/25/2008  . HYPERLIPIDEMIA 07/21/2008  . INSOMNIA-SLEEP DISORDER-UNSPEC 03/28/2010  . LOW BACK PAIN 09/03/2007  . NAUSEA 06/14/2010  . OSTEOARTHRITIS, KNEES, BILATERAL 08/25/2008  . PERIPHERAL EDEMA 07/21/2008  . SYMPTOM, ABNORMAL INVOLUNTARY MOVEMENT NEC 06/21/2007  . Wheezing 07/13/2009  . COPD (chronic obstructive pulmonary disease) 03/28/2012  . Colon polyps 03/28/2012   Past Surgical History  Procedure Date  . Right knee surgury     x 9  . Rotator cuff repair     bilateral  . Inguinal herniorrhapy   . Appendectomy   . Cholecystectomy   . Tonsillectomy   . Total knee arthroplasty     right  . Knee arthroscopy     left    reports that he has been smoking Cigarettes.   He has been smoking about 1 pack per day. He has never used smokeless tobacco. He reports that he does not drink alcohol or use illicit drugs. family history includes Coronary artery disease in his other. No Known Allergies Current Outpatient Prescriptions on File Prior to Visit  Medication Sig Dispense Refill  . albuterol (VENTOLIN HFA) 108 (90 BASE) MCG/ACT inhaler Inhale 1 puff into the lungs every 6 (six) hours as needed for wheezing.  1 Inhaler  11  . aspirin 81 MG EC tablet Take 81 mg by mouth daily.        . cyclobenzaprine (FLEXERIL) 5 MG tablet TAKE 1 TABLET BY MOUTH THREE TIMES DAILY AS NEEDED  60 tablet  5  . furosemide (LASIX) 20 MG tablet Take 20 mg by mouth as needed. swelling      . glucose blood (ONE TOUCH ULTRA TEST) test strip Use as instructed  900 each  2  . ipratropium-albuterol (DUONEB) 0.5-2.5 (3) MG/3ML SOLN Take 3 mLs by nebulization 4 (four) times daily as needed.  360 mL  3  . Lancets MISC 2 (two) times daily.        . montelukast (SINGULAIR) 10 MG tablet Take 1 tablet (10 mg total) by mouth at bedtime.  90 tablet  3  . simvastatin (ZOCOR) 40 MG tablet Take 0.5 tablets (20 mg total) by mouth at bedtime.  90 tablet  3  . sitaGLIPtan-metformin (  JANUMET) 50-1000 MG per tablet Take 1 tablet by mouth 2 (two) times daily.  180 tablet  3  . tiotropium (SPIRIVA) 18 MCG inhalation capsule Place 18 mcg into inhaler and inhale daily.        . Fluticasone Propionate, Inhal, (FLOVENT DISKUS) 100 MCG/BLIST AEPB Inhale 1 Inhaler into the lungs 2 (two) times daily.  60 each  11   Review of Systems Review of Systems  Constitutional: Negative for diaphoresis and unexpected weight change.  HENT: Negative for drooling and tinnitus.   Eyes: Negative for photophobia and visual disturbance.  Respiratory: Negative for choking and stridor.   Gastrointestinal: Negative for vomiting and blood in stool.  Genitourinary: Negative for hematuria and decreased urine volume.  Musculoskeletal:  Negative for gait problem.  Skin: Negative for color change and wound.  Neurological: Negative for tremors and numbness.  Psychiatric/Behavioral: Negative for decreased concentration. The patient is not hyperactive.      Objective:   Physical Exam BP 118/78  Pulse 82  Temp(Src) 98 F (36.7 C) (Oral)  Ht 5\' 10"  (1.778 m)  Wt 214 lb 8 oz (97.297 kg)  BMI 30.78 kg/m2  SpO2 97% Physical Exam  VS noted, not ill appearing Constitutional: Pt appears well-developed and well-nourished.  HENT: Head: Normocephalic.  Right Ear: External ear normal.  Left Ear: External ear normal.  Eyes: Conjunctivae and EOM are normal. Pupils are equal, round, and reactive to light.  Neck: Normal range of motion. Neck supple.  Cardiovascular: Normal rate and regular rhythm.   Pulmonary/Chest: Effort normal and breath sounds mild decreased, mild wheeze, no rales Abd:  Soft, NT, non-distended, + BS Neurological: Pt is alert. Not confused Skin: Skin is warm. No erythema. No rash Psychiatric: Pt behavior is normal. Thought content normal.     Assessment & Plan:

## 2012-03-28 NOTE — Patient Instructions (Addendum)
Take all new medications as prescribed - the prednisone, but also start the Flovent 100 at one puff twice per day  Continue all other medications as before, including the pain medication refill today Your refills were all done as requested Please return in 6 mo with Lab testing done 3-5 days before

## 2012-03-28 NOTE — Assessment & Plan Note (Signed)
Mild ongoing with wheeze like today 3-4 times per wk, for short prednisone, but also flovent inh 100 asd

## 2012-03-28 NOTE — Assessment & Plan Note (Signed)
stable overall by hx and exam, , and pt to continue medical treatment as before, for med refill  

## 2012-03-30 ENCOUNTER — Encounter: Payer: Self-pay | Admitting: Internal Medicine

## 2012-03-30 NOTE — Assessment & Plan Note (Addendum)
stable overall by hx and exam, most recent data reviewed with pt, and pt to continue medical treatment as before Lab Results  Component Value Date   HGBA1C 6.7* 03/25/2012

## 2012-03-30 NOTE — Assessment & Plan Note (Signed)
For predpack asd today, to f/u any worsening symptoms or concerns

## 2012-03-30 NOTE — Assessment & Plan Note (Signed)
stable overall by hx and exam, most recent data reviewed with pt, and pt to continue medical treatment as before Lab Results  Component Value Date   LDLCALC 38 03/25/2012

## 2012-04-11 ENCOUNTER — Telehealth: Payer: Self-pay | Admitting: Internal Medicine

## 2012-04-11 NOTE — Telephone Encounter (Signed)
Emergent Call:  Caller: Chase Gibson/Patient; PCP: Oliver Barre; CB#: [phone]; Call regarding Patient Instructed To Call Back If the Prednisone Didn't Help His Cold Sx: Now Has bilateral Ear Pain, Sore Throat, Hoarseness and non productive Cough.  Onset: 03/29/12 and progressivley worse since 04/08/12.  Afebrile.  FBS 97 04/11/12. Wheezing present with body aching.  Advised to see MD within 24 hrs; no appt remain for 04/11/12 or 04/12/12.  Info noted and sent to MD and [facility] for call back to pt.

## 2012-04-11 NOTE — Telephone Encounter (Signed)
Please advise - Can pt be scheduled 06/21?

## 2012-04-11 NOTE — Telephone Encounter (Signed)
APPT SCHEDULED FOR 10:45 FRIDAY

## 2012-04-11 NOTE — Telephone Encounter (Signed)
Ok for June 21

## 2012-04-12 ENCOUNTER — Ambulatory Visit (INDEPENDENT_AMBULATORY_CARE_PROVIDER_SITE_OTHER): Payer: Medicare Other | Admitting: Internal Medicine

## 2012-04-12 ENCOUNTER — Encounter: Payer: Self-pay | Admitting: Internal Medicine

## 2012-04-12 ENCOUNTER — Ambulatory Visit (INDEPENDENT_AMBULATORY_CARE_PROVIDER_SITE_OTHER)
Admission: RE | Admit: 2012-04-12 | Discharge: 2012-04-12 | Disposition: A | Discharge status: Home / Self Care | Payer: Medicare Other | Source: Ambulatory Visit | Attending: Internal Medicine | Admitting: Internal Medicine

## 2012-04-12 VITALS — BP 102/78 | HR 94 | Temp 98.0°F | Ht 70.0 in | Wt 213.0 lb

## 2012-04-12 DIAGNOSIS — J209 Acute bronchitis, unspecified: Secondary | ICD-10-CM

## 2012-04-12 DIAGNOSIS — M549 Dorsalgia, unspecified: Secondary | ICD-10-CM

## 2012-04-12 DIAGNOSIS — J449 Chronic obstructive pulmonary disease, unspecified: Secondary | ICD-10-CM

## 2012-04-12 DIAGNOSIS — R062 Wheezing: Secondary | ICD-10-CM

## 2012-04-12 DIAGNOSIS — J441 Chronic obstructive pulmonary disease with (acute) exacerbation: Secondary | ICD-10-CM

## 2012-04-12 MED ORDER — METHYLPREDNISOLONE ACETATE 80 MG/ML IJ SUSP
120.0000 mg | Freq: Once | INTRAMUSCULAR | Status: AC
  Administered 2012-04-12: 120 mg via INTRAMUSCULAR

## 2012-04-12 MED ORDER — KETOROLAC TROMETHAMINE 30 MG/ML IJ SOLN
30.0000 mg | Freq: Once | INTRAMUSCULAR | Status: AC
  Administered 2012-04-12: 30 mg via INTRAMUSCULAR

## 2012-04-12 MED ORDER — PREDNISONE 10 MG PO TABS: ORAL_TABLET | ORAL | Status: DC

## 2012-04-12 MED ORDER — AZITHROMYCIN 250 MG PO TABS: ORAL_TABLET | ORAL | Status: AC

## 2012-04-12 NOTE — Assessment & Plan Note (Signed)
?   msk vs pleuritic - for cxr as above,  to f/u any worsening symptoms or concerns, for toradol IM now

## 2012-04-12 NOTE — Patient Instructions (Addendum)
You had the pain shot (toradol), and steroid shot today Take all new medications as prescribed Continue all other medications as before Please go to XRAY in the Basement for the x-ray test You will be contacted by phone if any changes need to be made immediately.  Otherwise, you will receive a letter about your results with an explanation.

## 2012-04-13 ENCOUNTER — Encounter: Payer: Self-pay | Admitting: Internal Medicine

## 2012-04-13 NOTE — Assessment & Plan Note (Signed)
Mild to mod, for depomedrol IM, predpack  to f/u any worsening symptoms or concerns

## 2012-04-13 NOTE — Progress Notes (Signed)
Subjective:    Patient ID: Chase Gibson, male    DOB: 01-Feb-1956, 56 y.o.   MRN: [id number]  HPI  Here with acute onset mild to mod 2-3 days ST, HA, general weakness and malaise, with prod cough greenish sputum, but Pt denies chest pain, increased sob or doe, wheezing, orthopnea, PND, increased LE swelling, palpitations, dizziness or syncope, except for mild wheezing onset since yesterday.  Has some rather signficant left periscapular pain worse to breate also worse to move the left shoulder.    Pt denies polydipsia, polyuria  No other compalints Past Medical History  Diagnosis Date  . ALLERGIC RHINITIS 03/18/2009  . ANXIETY 06/24/2007  . ASTHMA 06/24/2007  . BREAST PAIN, LEFT 07/21/2008  . DEPRESSION 06/24/2007  . DIABETES MELLITUS, TYPE II 07/21/2008  . GERD 08/25/2008  . HYPERLIPIDEMIA 07/21/2008  . INSOMNIA-SLEEP DISORDER-UNSPEC 03/28/2010  . LOW BACK PAIN 09/03/2007  . NAUSEA 06/14/2010  . OSTEOARTHRITIS, KNEES, BILATERAL 08/25/2008  . PERIPHERAL EDEMA 07/21/2008  . SYMPTOM, ABNORMAL INVOLUNTARY MOVEMENT NEC 06/21/2007  . Wheezing 07/13/2009  . COPD (chronic obstructive pulmonary disease) 03/28/2012  . Colon polyps 03/28/2012   Past Surgical History  Procedure Date  . Right knee surgury     x 9  . Rotator cuff repair     bilateral  . Inguinal herniorrhapy   . Appendectomy   . Cholecystectomy   . Tonsillectomy   . Total knee arthroplasty     right  . Knee arthroscopy     left    reports that he has been smoking Cigarettes.  He has been smoking about 1 pack per day. He has never used smokeless tobacco. He reports that he does not drink alcohol or use illicit drugs. family history includes Coronary artery disease in his other. No Known Allergies Current Outpatient Prescriptions on File Prior to Visit  Medication Sig Dispense Refill  . albuterol (VENTOLIN HFA) 108 (90 BASE) MCG/ACT inhaler Inhale 1 puff into the lungs every 6 (six) hours as needed for wheezing.  1 Inhaler  11  . aspirin  81 MG EC tablet Take 81 mg by mouth daily.        . cyclobenzaprine (FLEXERIL) 5 MG tablet TAKE 1 TABLET BY MOUTH THREE TIMES DAILY AS NEEDED  60 tablet  5  . Fluticasone Propionate, Inhal, (FLOVENT DISKUS) 100 MCG/BLIST AEPB Inhale 1 Inhaler into the lungs 2 (two) times daily.  60 each  11  . furosemide (LASIX) 20 MG tablet Take 20 mg by mouth as needed. swelling      . glucose blood (ONE TOUCH ULTRA TEST) test strip Use as instructed  900 each  2  . HYDROcodone-acetaminophen (NORCO) 10-325 MG per tablet Take 1 tablet by mouth every 6 (six) hours as needed.  120 tablet  5  . ipratropium-albuterol (DUONEB) 0.5-2.5 (3) MG/3ML SOLN Take 3 mLs by nebulization 4 (four) times daily as needed.  360 mL  3  . Lancets MISC 2 (two) times daily.        . montelukast (SINGULAIR) 10 MG tablet Take 1 tablet (10 mg total) by mouth at bedtime.  90 tablet  3  . simvastatin (ZOCOR) 40 MG tablet Take 0.5 tablets (20 mg total) by mouth at bedtime.  90 tablet  3  . sitaGLIPtan-metformin (JANUMET) 50-1000 MG per tablet Take 1 tablet by mouth 2 (two) times daily.  180 tablet  3  . tiotropium (SPIRIVA) 18 MCG inhalation capsule Place 18 mcg into inhaler and inhale daily.         ,  Review of Systems Review of Systems  Constitutional: Negative for diaphoresis and unexpected weight change.  HENT: Negative for drooling and tinnitus.   Eyes: Negative for photophobia and visual disturbance.  Respiratory: Negative for choking and stridor.   Gastrointestinal: Negative for vomiting and blood in stool.  Genitourinary: Negative for hematuria and decreased urine volume.  Musculoskeletal: Negative for gait problem.  Skin: Negative for color change and wound. Psychiatric/Behavioral: Negative for decreased concentratio    Objective:   Physical Exam BP 102/78  Pulse 94  Temp 98 F (36.7 C) (Oral)  Ht 5\' 10"  (1.778 m)  Wt 213 lb (96.616 kg)  BMI 30.56 kg/m2  SpO2 94% Physical Exam  VS noted Constitutional: Pt appears  well-developed and well-nourished.  HENT: Head: Normocephalic.  Right Ear: External ear normal.  Left Ear: External ear normal.  Bilat tm's mild erythema.  Sinus nontender.  Pharynx mild erythema Eyes: Conjunctivae and EOM are normal. Pupils are equal, round, and reactive to light.  Neck: Normal range of motion. Neck supple.  Cardiovascular: Normal rate and regular rhythm.   Pulmonary/Chest: Effort normal and breath sounds mild decreased with mild wheeze bilat.  Neurological: Pt is alert. Not confused Skin: Skin is warm. No erythema.  Tender area to left medial periscapular area  Psychiatric: Pt behavior is normal. Thought content normal. 1+ nervous    Assessment & Plan:

## 2012-04-13 NOTE — Assessment & Plan Note (Signed)
Mild to mod, for antibx course,  to f/u any worsening symptoms or concerns 

## 2012-05-06 ENCOUNTER — Encounter: Payer: Self-pay | Admitting: Internal Medicine

## 2012-05-06 ENCOUNTER — Ambulatory Visit (INDEPENDENT_AMBULATORY_CARE_PROVIDER_SITE_OTHER): Payer: Medicare Other | Admitting: Internal Medicine

## 2012-05-06 ENCOUNTER — Telehealth: Payer: Self-pay | Admitting: Internal Medicine

## 2012-05-06 VITALS — BP 108/66 | HR 93 | Temp 98.1°F | Ht 70.0 in | Wt 218.4 lb

## 2012-05-06 DIAGNOSIS — E119 Type 2 diabetes mellitus without complications: Secondary | ICD-10-CM

## 2012-05-06 DIAGNOSIS — R062 Wheezing: Secondary | ICD-10-CM

## 2012-05-06 DIAGNOSIS — J209 Acute bronchitis, unspecified: Secondary | ICD-10-CM

## 2012-05-06 DIAGNOSIS — J449 Chronic obstructive pulmonary disease, unspecified: Secondary | ICD-10-CM

## 2012-05-06 DIAGNOSIS — J441 Chronic obstructive pulmonary disease with (acute) exacerbation: Secondary | ICD-10-CM

## 2012-05-06 MED ORDER — LEVOFLOXACIN 250 MG PO TABS: 250.0000 mg | ORAL_TABLET | Freq: Every day | ORAL | Status: AC

## 2012-05-06 MED ORDER — PREDNISONE 10 MG PO TABS: ORAL_TABLET | ORAL | Status: DC

## 2012-05-06 MED ORDER — CEFTRIAXONE SODIUM 1 G IJ SOLR
1.0000 g | Freq: Once | INTRAMUSCULAR | Status: AC
  Administered 2012-05-06: 1 g via INTRAMUSCULAR

## 2012-05-06 MED ORDER — METHYLPREDNISOLONE ACETATE 80 MG/ML IJ SUSP
120.0000 mg | Freq: Once | INTRAMUSCULAR | Status: AC
  Administered 2012-05-06: 120 mg via INTRAMUSCULAR

## 2012-05-06 MED ORDER — HYDROCODONE-HOMATROPINE 5-1.5 MG/5ML PO SYRP: 5.0000 mL | ORAL_SOLUTION | Freq: Four times a day (QID) | ORAL | Status: AC | PRN

## 2012-05-06 MED ORDER — FLUTICASONE-SALMETEROL 250-50 MCG/DOSE IN AEPB: 1.0000 | INHALATION_SPRAY | Freq: Two times a day (BID) | RESPIRATORY_TRACT | Status: DC

## 2012-05-06 NOTE — Progress Notes (Signed)
Subjective:    Patient ID: Chase Gibson, male    DOB: [birth year], 56 y.o.   MRN: [id number]  HPI  Here with acute onset mild to mod 2-3 days ST, HA, general weakness and malaise, with prod cough greenish sputum, but Pt denies chest pain, increased sob or doe, wheezing, orthopnea, PND, increased LE swelling, palpitations, dizziness or syncope, except for onset mild wheezing yesterday.  Pt denies new neurological symptoms such as new headache, or facial or extremity weakness or numbness   Pt denies polydipsia, polyuria.  Denies worsening depressive symptoms, suicidal ideation, or panic, though has ongoing anxiety, but  - Stopped smoking June 23.  CBG 112 this am.   Pt denies fever, wt loss, night sweats, loss of appetite, or other constitutional symptoms except for the above.  Overall good compliance with treatment, and good medicine tolerability, except flovent has not really been working. Past Medical History  Diagnosis Date  . ALLERGIC RHINITIS 03/18/2009  . ANXIETY 06/24/2007  . ASTHMA 06/24/2007  . BREAST PAIN, LEFT 07/21/2008  . DEPRESSION 06/24/2007  . DIABETES MELLITUS, TYPE II 07/21/2008  . GERD 08/25/2008  . HYPERLIPIDEMIA 07/21/2008  . INSOMNIA-SLEEP DISORDER-UNSPEC 03/28/2010  . LOW BACK PAIN 09/03/2007  . NAUSEA 06/14/2010  . OSTEOARTHRITIS, KNEES, BILATERAL 08/25/2008  . PERIPHERAL EDEMA 07/21/2008  . SYMPTOM, ABNORMAL INVOLUNTARY MOVEMENT NEC 06/21/2007  . Wheezing 07/13/2009  . COPD (chronic obstructive pulmonary disease) 03/28/2012  . Colon polyps 03/28/2012   Past Surgical History  Procedure Date  . Right knee surgury     x 9  . Rotator cuff repair     bilateral  . Inguinal herniorrhapy   . Appendectomy   . Cholecystectomy   . Tonsillectomy   . Total knee arthroplasty     right  . Knee arthroscopy     left    reports that he has been smoking Cigarettes.  He has been smoking about 1 pack per day. He has never used smokeless tobacco. He reports that he does not drink alcohol or  use illicit drugs. family history includes Coronary artery disease in his other. No Known Allergies Current Outpatient Prescriptions on File Prior to Visit  Medication Sig Dispense Refill  . aspirin 81 MG EC tablet Take 81 mg by mouth daily.        . cyclobenzaprine (FLEXERIL) 5 MG tablet TAKE 1 TABLET BY MOUTH THREE TIMES DAILY AS NEEDED  60 tablet  5  . furosemide (LASIX) 20 MG tablet Take 20 mg by mouth as needed. swelling      . glucose blood (ONE TOUCH ULTRA TEST) test strip Use as instructed  900 each  2  . HYDROcodone-acetaminophen (NORCO) 10-325 MG per tablet Take 1 tablet by mouth every 6 (six) hours as needed.  120 tablet  5  . ipratropium-albuterol (DUONEB) 0.5-2.5 (3) MG/3ML SOLN Take 3 mLs by nebulization 4 (four) times daily as needed.  360 mL  3  . Lancets MISC 2 (two) times daily.        . montelukast (SINGULAIR) 10 MG tablet Take 1 tablet (10 mg total) by mouth at bedtime.  90 tablet  3  . simvastatin (ZOCOR) 40 MG tablet Take 0.5 tablets (20 mg total) by mouth at bedtime.  90 tablet  3  . sitaGLIPtan-metformin (JANUMET) 50-1000 MG per tablet Take 1 tablet by mouth 2 (two) times daily.  180 tablet  3  . tiotropium (SPIRIVA) 18 MCG inhalation capsule Place 18 mcg into inhaler and inhale daily.        Marland Kitchen  albuterol (VENTOLIN HFA) 108 (90 BASE) MCG/ACT inhaler Inhale 1 puff into the lungs every 6 (six) hours as needed for wheezing.  1 Inhaler  11  . Fluticasone-Salmeterol (ADVAIR DISKUS) 250-50 MCG/DOSE AEPB Inhale 1 puff into the lungs 2 (two) times daily.  1 each  11   Review of Systems Review of Systems  Constitutional: Negative for diaphoresis and unexpected weight change.  HENT: Negative for tinnitus.   Eyes: Negative for photophobia and visual disturbance.  Respiratory: Negative for choking and stridor.   Gastrointestinal: Negative for vomiting and blood in stool.  Genitourinary: Negative for hematuria and decreased urine volume.  Musculoskeletal: Negative for gait  problem.  Skin: Negative for color change and wound.  Neurological: Negative for tremors and numbness.     Objective:   Physical Exam BP 108/66  Pulse 93  Temp 98.1 F (36.7 C) (Oral)  Ht 5\' 10"  (1.778 m)  Wt 218 lb 6 oz (99.054 kg)  BMI 31.33 kg/m2  SpO2 95% Physical Exam  VS noted, mild ill Constitutional: Pt appears well-developed and well-nourished.  HENT: Head: Normocephalic.  Right Ear: External ear normal.  Left Ear: External ear normal.  Bilat tm's mild erythema.  Sinus nontender.  Pharynx mild erythema Eyes: Conjunctivae and EOM are normal. Pupils are equal, round, and reactive to light.  Neck: Normal range of motion. Neck supple.  Cardiovascular: Normal rate and regular rhythm.   Pulmonary/Chest: Effort normal and breath sounds decreased bilat , mild wheezes Neurological: Pt is alert. Not confused Psychiatric: Pt behavior is normal. Thought content normal. 1+ nervous    Assessment & Plan:

## 2012-05-06 NOTE — Telephone Encounter (Signed)
Caller: Chase Gibson/Patient; PCP: Oliver Barre; CB#: [phone]; Call regarding Cough onset prior to 06/23.  Has already taken 2 courses of Zpack, no improvement in cough.  Requesting another antibiotic to be called in.  Occasionally coughs to "the point of feeling lightheaded, and has fallen."  Afebrile.  (Of note, he quit smoking June 23.)   Is coughing up green mucous, and wheezing (RN did not hear wheezing at time of call).  RN advised he would need to be seen, requests appt without triage.  Denies emergent sx.  Appt scheduled 07/15 @ 1445.  Advised to call back for any worsening or new sx prior to appt.

## 2012-05-06 NOTE — Patient Instructions (Addendum)
You had the steroid and antibiotic shot today Take all new medications as prescribed - the antibiotic, cough medicine, prednisone, and advair (including the sample advair at 1 puff twice per day) Continue all other medications as before, except ok to stop the flovent Please continue to stop smoking as you have

## 2012-05-16 ENCOUNTER — Other Ambulatory Visit: Payer: Self-pay | Admitting: Internal Medicine

## 2012-05-17 ENCOUNTER — Other Ambulatory Visit: Payer: Self-pay

## 2012-05-17 MED ORDER — ALBUTEROL SULFATE HFA 108 (90 BASE) MCG/ACT IN AERS: 1.0000 | INHALATION_SPRAY | Freq: Four times a day (QID) | RESPIRATORY_TRACT | Status: DC | PRN

## 2012-05-19 ENCOUNTER — Encounter: Payer: Self-pay | Admitting: Internal Medicine

## 2012-05-19 NOTE — Assessment & Plan Note (Signed)
stable overall by hx and exam, most recent data reviewed with pt, and pt to continue medical treatment as before Lab Results  Component Value Date   HGBA1C 6.7* 03/25/2012   To call for cbg > 200 on prednisone, or onset polys

## 2012-05-19 NOTE — Assessment & Plan Note (Signed)
Mild to mod, for antibx course,  to f/u any worsening symptoms or concerns 

## 2012-05-19 NOTE — Assessment & Plan Note (Signed)
Mild to mod, for predpack asd,  to f/u any worsening symptoms or concerns 

## 2012-05-19 NOTE — Assessment & Plan Note (Signed)
flovent not working as well, for advair asd, Continue all other medications as before,  to f/u any worsening symptoms or concerns

## 2012-05-29 ENCOUNTER — Encounter: Payer: Self-pay | Admitting: Internal Medicine

## 2012-05-29 ENCOUNTER — Telehealth: Payer: Self-pay

## 2012-05-29 NOTE — Telephone Encounter (Signed)
This appears to be correct, pt is clearly overusing controlled substance from mult MD's/mult pharmacies  Will d/c from practice, no further narcotic from this office

## 2012-05-29 NOTE — Telephone Encounter (Signed)
Pharmacist at [facility] on [address] Weston Brass) called to inform the patient (or someone else) has refilled the norco 10/325 #120 6 times since March 28, 2012.  The prescription has been filled at his pharmacy 2 times, he assumes twice at [address] and [address].  He did fax a report query and copy is on MD's desk for review.  Please advise

## 2012-05-30 NOTE — Telephone Encounter (Signed)
D/C letter completed by MD, completed form gave to [place] Johnson to take care of.

## 2012-06-03 ENCOUNTER — Telehealth: Payer: Self-pay | Admitting: Internal Medicine

## 2012-06-03 NOTE — Telephone Encounter (Signed)
Dismissal Letter sent by Certified Mail 06/03/2012  Received the Return Receipt showing the patient picked up Dismissal Letter 06/14/2012

## 2012-06-04 ENCOUNTER — Telehealth: Payer: Self-pay | Admitting: Internal Medicine

## 2012-06-04 NOTE — Telephone Encounter (Signed)
[place] controlled substance database indicates last Rx given to pt filled 6 separate times (apparently copied rx); also prior to that similar rx prescribed by 2 other providers

## 2012-06-04 NOTE — Telephone Encounter (Signed)
Called the patient informed of MD instructions.  He stated has only seen Dr. Jonny Ruiz.  Was seen by an ortho. 2 months ago and given 15 percocet.  He stated someone visiting his home took the hardcopy of his prescriptions and made copies.  Patient would like to schedule appt. To speak to Dr. Jonny Ruiz please advise as patient is very anxious over situation

## 2012-06-04 NOTE — Telephone Encounter (Signed)
Unfortunately not able, as this has been a recurring issue and pt must have been aware of obtaining the same prescription from 2 different MD's for many months

## 2012-09-27 ENCOUNTER — Ambulatory Visit: Payer: Medicare Other | Admitting: Internal Medicine

## 2013-02-11 ENCOUNTER — Institutional Professional Consult (permissible substitution): Payer: Medicare Other | Admitting: Internal Medicine

## 2013-02-21 ENCOUNTER — Institutional Professional Consult (permissible substitution): Payer: Medicare Other | Admitting: Internal Medicine

## 2013-02-24 ENCOUNTER — Encounter: Payer: Self-pay | Admitting: Internal Medicine

## 2013-02-25 ENCOUNTER — Institutional Professional Consult (permissible substitution): Payer: Medicare Other | Admitting: Internal Medicine

## 2013-02-27 ENCOUNTER — Emergency Department (HOSPITAL_COMMUNITY): Payer: Medicare Other

## 2013-02-27 ENCOUNTER — Emergency Department (HOSPITAL_COMMUNITY)
Admission: EM | Admit: 2013-02-27 | Discharge: 2013-02-28 | Disposition: A | Discharge status: Home / Self Care | Payer: Medicare Other | Attending: Emergency Medicine | Admitting: Emergency Medicine

## 2013-02-27 ENCOUNTER — Encounter (HOSPITAL_COMMUNITY): Payer: Self-pay | Admitting: *Deleted

## 2013-02-27 DIAGNOSIS — Z79899 Other long term (current) drug therapy: Secondary | ICD-10-CM | POA: Insufficient documentation

## 2013-02-27 DIAGNOSIS — K219 Gastro-esophageal reflux disease without esophagitis: Secondary | ICD-10-CM | POA: Insufficient documentation

## 2013-02-27 DIAGNOSIS — J449 Chronic obstructive pulmonary disease, unspecified: Secondary | ICD-10-CM | POA: Insufficient documentation

## 2013-02-27 DIAGNOSIS — Z860100 Personal history of colon polyps, unspecified: Secondary | ICD-10-CM | POA: Insufficient documentation

## 2013-02-27 DIAGNOSIS — F3289 Other specified depressive episodes: Secondary | ICD-10-CM | POA: Insufficient documentation

## 2013-02-27 DIAGNOSIS — J4489 Other specified chronic obstructive pulmonary disease: Secondary | ICD-10-CM | POA: Insufficient documentation

## 2013-02-27 DIAGNOSIS — Z9889 Other specified postprocedural states: Secondary | ICD-10-CM | POA: Insufficient documentation

## 2013-02-27 DIAGNOSIS — E785 Hyperlipidemia, unspecified: Secondary | ICD-10-CM | POA: Insufficient documentation

## 2013-02-27 DIAGNOSIS — M25519 Pain in unspecified shoulder: Secondary | ICD-10-CM | POA: Insufficient documentation

## 2013-02-27 DIAGNOSIS — F329 Major depressive disorder, single episode, unspecified: Secondary | ICD-10-CM | POA: Insufficient documentation

## 2013-02-27 DIAGNOSIS — F411 Generalized anxiety disorder: Secondary | ICD-10-CM | POA: Insufficient documentation

## 2013-02-27 DIAGNOSIS — E119 Type 2 diabetes mellitus without complications: Secondary | ICD-10-CM | POA: Insufficient documentation

## 2013-02-27 DIAGNOSIS — Z8739 Personal history of other diseases of the musculoskeletal system and connective tissue: Secondary | ICD-10-CM | POA: Insufficient documentation

## 2013-02-27 DIAGNOSIS — Z8719 Personal history of other diseases of the digestive system: Secondary | ICD-10-CM | POA: Insufficient documentation

## 2013-02-27 DIAGNOSIS — M25511 Pain in right shoulder: Secondary | ICD-10-CM

## 2013-02-27 DIAGNOSIS — F17200 Nicotine dependence, unspecified, uncomplicated: Secondary | ICD-10-CM | POA: Insufficient documentation

## 2013-02-27 MED ORDER — MORPHINE SULFATE 4 MG/ML IJ SOLN
4.0000 mg | Freq: Once | INTRAMUSCULAR | Status: AC
  Administered 2013-02-28: 4 mg via INTRAMUSCULAR
  Filled 2013-02-27: qty 1

## 2013-02-27 MED ORDER — KETOROLAC TROMETHAMINE 60 MG/2ML IM SOLN: 60.0000 mg | Freq: Once | INTRAMUSCULAR | Status: DC

## 2013-02-27 NOTE — ED Notes (Signed)
Pt states that he had rt shoulder surgery in December and has chronic rt shoulder pain; pt states that the pain has worsened over the last 3 days.

## 2013-02-28 NOTE — ED Provider Notes (Signed)
History     CSN: [id number]  Arrival date & time 02/27/13  2325   First MD Initiated Contact with Patient 02/27/13 2342      Chief Complaint  Patient presents with  . Shoulder Pain    (Consider location/radiation/quality/duration/timing/severity/associated sxs/prior treatment) HPI Comments: Patient presents emergency department with chief complaint of chronic right shoulder pain. He states that he had surgery in December. He states that he had his rotator cuff and biceps tendon repair. He states that over the past 3 days, he has had worsening pain. He states that it hurts to elevate his arm. States that the pain as sharp. States the pain is moderate to severe. He states that he has taken hydrocodone for the pain with some relief. The pain does not radiate. He denies any fevers, chills, nausea, vomiting, numbness or tingling of the extremities.  The history is provided by the patient. No language interpreter was used.    Past Medical History  Diagnosis Date  . ALLERGIC RHINITIS 03/18/2009  . ANXIETY 06/24/2007  . ASTHMA 06/24/2007  . BREAST PAIN, LEFT 07/21/2008  . DEPRESSION 06/24/2007  . DIABETES MELLITUS, TYPE II 07/21/2008  . GERD 08/25/2008  . HYPERLIPIDEMIA 07/21/2008  . INSOMNIA-SLEEP DISORDER-UNSPEC 03/28/2010  . LOW BACK PAIN 09/03/2007  . NAUSEA 06/14/2010  . OSTEOARTHRITIS, KNEES, BILATERAL 08/25/2008  . PERIPHERAL EDEMA 07/21/2008  . SYMPTOM, ABNORMAL INVOLUNTARY MOVEMENT NEC 06/21/2007  . Wheezing 07/13/2009  . COPD (chronic obstructive pulmonary disease) 03/28/2012  . Colon polyps 03/28/2012    Past Surgical History  Procedure Laterality Date  . Right knee surgury      x 9  . Rotator cuff repair      bilateral  . Inguinal herniorrhapy    . Appendectomy    . Cholecystectomy    . Tonsillectomy    . Total knee arthroplasty      right  . Knee arthroscopy      left    Family History  Problem Relation Age of Onset  . Coronary artery disease Other     History  Substance  Use Topics  . Smoking status: Current Every Day Smoker -- 1.00 packs/day    Types: Cigarettes  . Smokeless tobacco: Never Used  . Alcohol Use: No      Review of Systems  All other systems reviewed and are negative.    Allergies  Review of patient's allergies indicates no known allergies.  Home Medications   Current Outpatient Rx  Name  Route  Sig  Dispense  Refill  . albuterol (VENTOLIN HFA) 108 (90 BASE) MCG/ACT inhaler   Inhalation   Inhale 1 puff into the lungs every 6 (six) hours as needed for wheezing.   1 Inhaler   11   . aspirin 81 MG EC tablet   Oral   Take 81 mg by mouth daily.           . cyclobenzaprine (FLEXERIL) 5 MG tablet      TAKE 1 TABLET BY MOUTH THREE TIMES DAILY AS NEEDED   60 tablet   5   . Fluticasone-Salmeterol (ADVAIR DISKUS) 250-50 MCG/DOSE AEPB   Inhalation   Inhale 1 puff into the lungs 2 (two) times daily.   1 each   11   . furosemide (LASIX) 20 MG tablet   Oral   Take 20 mg by mouth as needed. swelling         . glucose blood (ONE TOUCH ULTRA TEST) test strip  Use as instructed   900 each   2   . HYDROcodone-acetaminophen (NORCO) 10-325 MG per tablet   Oral   Take 1 tablet by mouth every 6 (six) hours as needed.   120 tablet   5   . ipratropium-albuterol (DUONEB) 0.5-2.5 (3) MG/3ML SOLN   Nebulization   Take 3 mLs by nebulization 4 (four) times daily as needed.   360 mL   3   . Lancets MISC      2 (two) times daily.           . montelukast (SINGULAIR) 10 MG tablet   Oral   Take 1 tablet (10 mg total) by mouth at bedtime.   90 tablet   3   . predniSONE (DELTASONE) 10 MG tablet      3 tabs by mouth per day for 3 days,2tabs per day for 3 days,1tab per day for 3 days   18 tablet   0   . simvastatin (ZOCOR) 40 MG tablet   Oral   Take 0.5 tablets (20 mg total) by mouth at bedtime.   90 tablet   3   . sitaGLIPtan-metformin (JANUMET) 50-1000 MG per tablet   Oral   Take 1 tablet by mouth 2 (two)  times daily.   180 tablet   3   . tiotropium (SPIRIVA) 18 MCG inhalation capsule   Inhalation   Place 18 mcg into inhaler and inhale daily.             BP 116/73  Pulse 85  Temp(Src) 98.3 F (36.8 C) (Oral)  Resp 18  Ht 5\' 10"  (1.778 m)  Wt 213 lb (96.616 kg)  BMI 30.56 kg/m2  SpO2 95%  Physical Exam  Nursing note and vitals reviewed. Constitutional: He is oriented to person, place, and time. He appears well-developed and well-nourished.  HENT:  Head: Normocephalic and atraumatic.  Eyes: Conjunctivae and EOM are normal.  Neck: Normal range of motion.  Cardiovascular: Normal rate.   Pulmonary/Chest: Effort normal.  Abdominal: He exhibits no distension.  Musculoskeletal: Normal range of motion. He exhibits tenderness. He exhibits no edema.  Right shoulder tender to palpation over the biceps tendon, Hawkins/Kennedy impingement test positive, range of motion 5/5, strength deferred secondary to pain, no obvious abnormality or deformity  Neurological: He is alert and oriented to person, place, and time.  Skin: Skin is dry.  Psychiatric: He has a normal mood and affect. His behavior is normal. Judgment and thought content normal.    ED Course  Procedures (including critical care time)  Labs Reviewed - No data to display Dg Shoulder Right  02/28/2013  *RADIOLOGY REPORT*  Clinical Data: 57 year old male right shoulder pain.  History of surgery.  RIGHT SHOULDER - 2+ VIEW  Comparison: None.  Findings: Postoperative changes to the right humeral head. No glenohumeral joint dislocation.  Proximal right humerus intact. Mild degenerative spurring at the right acromioclavicular joint. Right clavicle and scapula intact.  There may be chronic fractures of the right anterolateral fourth and fifth ribs.  Otherwise the visualized right ribs lung parenchyma are within normal limits.  IMPRESSION: No acute osseous abnormality identified about the right shoulder.   Original Report Authenticated By:  Erskine Speed, M.D.      1. Shoulder pain, right       MDM  Patient with right shoulder pain. Patient has chronic shoulder pain. Plain films are unremarkable. I suspect shoulder impingement syndrome based on his physical exam. Recommend orthopedic followup. He  does not want any pain medicine, as he has sufficient home. He is requesting a pain shot. I will give him morphine IM. He is agreeable with this. Recommend ice and physical therapy. Patient understands and agrees with the plan. He is stable and ready for discharge.        Roxy Horseman, PA-C 02/28/13 [phone]

## 2013-02-28 NOTE — ED Provider Notes (Signed)
Medical screening examination/treatment/procedure(s) were performed by non-physician practitioner and as supervising physician I was immediately available for consultation/collaboration.  John-Adam Idona Stach, M.D.   John-Adam Mordche Hedglin, MD 02/28/13 0749 

## 2013-03-25 ENCOUNTER — Encounter: Payer: Self-pay | Admitting: Internal Medicine

## 2013-03-25 ENCOUNTER — Ambulatory Visit (INDEPENDENT_AMBULATORY_CARE_PROVIDER_SITE_OTHER): Payer: Medicare Other | Admitting: Internal Medicine

## 2013-03-25 ENCOUNTER — Ambulatory Visit (INDEPENDENT_AMBULATORY_CARE_PROVIDER_SITE_OTHER)
Admission: RE | Admit: 2013-03-25 | Discharge: 2013-03-25 | Disposition: A | Discharge status: Home / Self Care | Payer: Medicare Other | Source: Ambulatory Visit | Attending: Internal Medicine | Admitting: Internal Medicine

## 2013-03-25 VITALS — BP 104/62 | HR 91 | Temp 98.0°F | Ht 67.75 in | Wt 210.8 lb

## 2013-03-25 DIAGNOSIS — J4489 Other specified chronic obstructive pulmonary disease: Secondary | ICD-10-CM

## 2013-03-25 DIAGNOSIS — J449 Chronic obstructive pulmonary disease, unspecified: Secondary | ICD-10-CM

## 2013-03-25 MED ORDER — BUDESONIDE-FORMOTEROL FUMARATE 160-4.5 MCG/ACT IN AERO: INHALATION_SPRAY | RESPIRATORY_TRACT | Status: DC

## 2013-03-25 NOTE — Patient Instructions (Addendum)
Stop advair and atrovent for now  Plan A= automatic = symbicort 160 Take 2 puffs first thing in am and then another 2 puffs about 12 hours later.  Plus use spiriva immediately after the am symbicort  Plan B = backup, Only use your albuterol (ventolin)as a rescue medication to be used if you can't catch your breath by resting or doing a relaxed purse lip breathing pattern. The less you use it, the better it will work when you need it.  You could use it up to every 4 hours  Plan C=   only use albuterol nebulizer if Plan B doesn't work, also could use up to every 4 hours  Plan D = Doctor, call if you notice you need more of Plan C than you do now   Plan E = ER if Plan A thru E don't work  Please remember to go to the  x-ray department downstairs for your tests - we will call you with the results when they are available.  Please schedule a follow up office visit in 4-6  weeks, sooner if needed

## 2013-03-25 NOTE — Progress Notes (Signed)
  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 Copd by Dr Azucena Cecil.   03/25/2013 1st pulmonary ov cc intermittent wheeze and sob despite advair Reita May ha with doe x yardwork but ok walking flat and carrying groceries.  symtpoms x 20 years but much better since quit smoking but still using frequent nebs with albuterol and was on propranol for tremors but no longer takes it.  No obvious daytime variabilty or   cp or chest tightness,  overt sinus  symptoms. No unusual exp hx or h/o childhood pna/ asthma or premature birth to his knowledge. occ severe hb ok on otc's prn  Sleeping ok without nocturnal  or early am exacerbation  of respiratory  c/o's or need for noct saba. Also denies any obvious fluctuation of symptoms with weather or environmental changes or other aggravating or alleviating factors except as outlined above.    Review of Systems  Constitutional: Negative for fever, chills, activity change, appetite change and unexpected weight change.  HENT: Negative for congestion, sore throat, rhinorrhea, sneezing, trouble swallowing, dental problem, voice change and postnasal drip.   Eyes: Negative for visual disturbance.  Respiratory: Positive for cough and shortness of breath. Negative for choking.   Cardiovascular: Negative for chest pain and leg swelling.  Gastrointestinal: Negative for nausea, vomiting and abdominal pain.       Acid Heartburn  Genitourinary: Negative for difficulty urinating.  Musculoskeletal: Negative for arthralgias.  Skin: Negative for rash.  Psychiatric/Behavioral: Negative for behavioral problems and confusion.       Objective:   Physical Exam  Wt Readings from Last 3 Encounters:  03/25/13 210 lb 12.8 oz (95.618 kg)  02/27/13 213 lb  (96.616 kg)  05/06/12 218 lb 6 oz (99.054 kg)    HEENT mild turbinate edema.  Oropharynx no thrush or excess pnd or cobblestoning.  No JVD or cervical adenopathy. Mild accessory muscle hypertrophy. Trachea midline, nl thryroid. Chest was hyperinflated by percussion with diminished breath sounds and moderate increased exp time with mid bilateral exp wheeze. Hoover sign positive at mid inspiration. Regular rate and rhythm without murmur gallop or rub or increase P2 or edema.  Abd: no hsm, nl excursion. Ext warm without cyanosis or clubbing.    CXR  03/25/2013 :   No acute cardiopulmonary abnormality seen. Hyperinflation of the lungs is noted suggesting chronic obstructive pulmonary disease.       Assessment & Plan:

## 2013-03-25 NOTE — Assessment & Plan Note (Addendum)
DDX of  difficult airways managment all start with A and  include Adherence, Ace Inhibitors, Acid Reflux, Active Sinus Disease, Alpha 1 Antitripsin deficiency, Anxiety masquerading as Airways dz,  ABPA,  allergy(esp in young), Aspiration (esp in elderly), Adverse effects of DPI,  Active smokers, plus two Bs  = Bronchiectasis and Beta blocker use..and one C= CHF  In this case Adherence is the biggest issue and starts with  inability to use HFA effectively and also  understand that SABA treats the symptoms but doesn't get to the underlying problem (inflammation).  I used  the analogy of putting steroid cream on a rash to help explain the meaning of topical therapy and the need to get the drug to the target tissue.  The proper method of use, as well as anticipated side effects, of a metered-dose inhaler are discussed and demonstrated to the patient. Improved effectiveness after extensive coaching during this visit to a level of approximately  75%   rec trial of symbicort 160 2bid  ? Adverse effect of dpi, esp advair, assoc with hoarseness > change to hfa symbicort    Each maintenance medication was reviewed in detail including most importantly the difference between maintenance and as needed and under what circumstances the prns are to be used.  Please see instructions for details which were reviewed in writing and the patient given a copy.

## 2013-03-26 NOTE — Progress Notes (Signed)
Quick Note:  Spoke with pt and notified of results per Dr. Wert. Pt verbalized understanding and denied any questions.  ______ 

## 2013-05-06 ENCOUNTER — Ambulatory Visit: Payer: Medicare Other | Admitting: Internal Medicine

## 2013-05-23 ENCOUNTER — Ambulatory Visit: Payer: Medicare Other | Admitting: Internal Medicine

## 2013-07-07 ENCOUNTER — Encounter (HOSPITAL_COMMUNITY): Payer: Self-pay | Admitting: Emergency Medicine

## 2013-07-07 ENCOUNTER — Emergency Department (HOSPITAL_COMMUNITY)
Admission: EM | Admit: 2013-07-07 | Discharge: 2013-07-07 | Disposition: A | Discharge status: Home / Self Care | Payer: Medicare Other | Attending: Emergency Medicine | Admitting: Emergency Medicine

## 2013-07-07 ENCOUNTER — Emergency Department (HOSPITAL_COMMUNITY): Payer: Medicare Other

## 2013-07-07 DIAGNOSIS — Z860100 Personal history of colon polyps, unspecified: Secondary | ICD-10-CM | POA: Insufficient documentation

## 2013-07-07 DIAGNOSIS — F17200 Nicotine dependence, unspecified, uncomplicated: Secondary | ICD-10-CM | POA: Insufficient documentation

## 2013-07-07 DIAGNOSIS — J441 Chronic obstructive pulmonary disease with (acute) exacerbation: Secondary | ICD-10-CM | POA: Insufficient documentation

## 2013-07-07 DIAGNOSIS — IMO0002 Reserved for concepts with insufficient information to code with codable children: Secondary | ICD-10-CM | POA: Insufficient documentation

## 2013-07-07 DIAGNOSIS — Z8659 Personal history of other mental and behavioral disorders: Secondary | ICD-10-CM | POA: Insufficient documentation

## 2013-07-07 DIAGNOSIS — E119 Type 2 diabetes mellitus without complications: Secondary | ICD-10-CM | POA: Insufficient documentation

## 2013-07-07 DIAGNOSIS — J209 Acute bronchitis, unspecified: Secondary | ICD-10-CM

## 2013-07-07 DIAGNOSIS — M1710 Unilateral primary osteoarthritis, unspecified knee: Secondary | ICD-10-CM | POA: Insufficient documentation

## 2013-07-07 DIAGNOSIS — Z8719 Personal history of other diseases of the digestive system: Secondary | ICD-10-CM | POA: Insufficient documentation

## 2013-07-07 DIAGNOSIS — R739 Hyperglycemia, unspecified: Secondary | ICD-10-CM

## 2013-07-07 DIAGNOSIS — E785 Hyperlipidemia, unspecified: Secondary | ICD-10-CM | POA: Insufficient documentation

## 2013-07-07 DIAGNOSIS — Z79899 Other long term (current) drug therapy: Secondary | ICD-10-CM | POA: Insufficient documentation

## 2013-07-07 DIAGNOSIS — Z7982 Long term (current) use of aspirin: Secondary | ICD-10-CM | POA: Insufficient documentation

## 2013-07-07 DIAGNOSIS — J45901 Unspecified asthma with (acute) exacerbation: Secondary | ICD-10-CM | POA: Insufficient documentation

## 2013-07-07 LAB — COMPREHENSIVE METABOLIC PANEL
AST: 22 U/L (ref 0–37)
Albumin: 4 g/dL (ref 3.5–5.2)
BUN: 8 mg/dL (ref 6–23)
Calcium: 9.8 mg/dL (ref 8.4–10.5)
Chloride: 93 mEq/L — ABNORMAL LOW (ref 96–112)
Creatinine, Ser: 0.89 mg/dL (ref 0.50–1.35)
Total Bilirubin: 0.5 mg/dL (ref 0.3–1.2)

## 2013-07-07 LAB — URINALYSIS, ROUTINE W REFLEX MICROSCOPIC
Glucose, UA: >1000 mg/dL — AB
Hgb urine dipstick: NEGATIVE
Leukocytes, UA: NEGATIVE
Protein, ur: NEGATIVE mg/dL
Specific Gravity, Urine: 1.041 — ABNORMAL HIGH (ref 1.005–1.030)
Urobilinogen, UA: 0.2 mg/dL (ref 0.0–1.0)

## 2013-07-07 LAB — CBC WITH DIFFERENTIAL/PLATELET
Basophils Absolute: 0.1 10*3/uL (ref 0.0–0.1)
Basophils Relative: 1 % (ref 0–1)
Eosinophils Absolute: 0.2 10*3/uL (ref 0.0–0.7)
Eosinophils Relative: 2 % (ref 0–5)
HCT: 42.5 % (ref 39.0–52.0)
Hemoglobin: 15.3 g/dL (ref 13.0–17.0)
MCH: 31.1 pg (ref 26.0–34.0)
MCHC: 36 g/dL (ref 30.0–36.0)
MCV: 86.4 fL (ref 78.0–100.0)
Monocytes Absolute: 0.5 10*3/uL (ref 0.1–1.0)
Monocytes Relative: 6 % (ref 3–12)
RDW: 13.8 % (ref 11.5–15.5)

## 2013-07-07 LAB — URINE MICROSCOPIC-ADD ON

## 2013-07-07 LAB — GLUCOSE, CAPILLARY

## 2013-07-07 MED ORDER — INSULIN ASPART 100 UNIT/ML ~~LOC~~ SOLN
10.0000 [IU] | Freq: Once | SUBCUTANEOUS | Status: AC
  Administered 2013-07-07: 10 [IU] via SUBCUTANEOUS
  Filled 2013-07-07: qty 1

## 2013-07-07 MED ORDER — SODIUM CHLORIDE 0.9 % IV BOLUS (SEPSIS)
1000.0000 mL | Freq: Once | INTRAVENOUS | Status: AC
  Administered 2013-07-07: 1000 mL via INTRAVENOUS

## 2013-07-07 MED ORDER — METHYLPREDNISOLONE SODIUM SUCC 125 MG IJ SOLR: 60.0000 mg | Freq: Once | INTRAMUSCULAR | Status: DC

## 2013-07-07 MED ORDER — PREDNISONE 20 MG PO TABS
40.0000 mg | ORAL_TABLET | Freq: Once | ORAL | Status: AC
  Administered 2013-07-07: 40 mg via ORAL
  Filled 2013-07-07: qty 2

## 2013-07-07 MED ORDER — PREDNISONE 20 MG PO TABS: 40.0000 mg | ORAL_TABLET | Freq: Every day | ORAL | Status: DC

## 2013-07-07 NOTE — ED Notes (Signed)
Pt has DM2 and takes pills but last night and today his BS has been 400-500 range. Normally runs 90-160. Pt states that he has been sick with bronchitis. Smoker. Alert and oriented.

## 2013-07-07 NOTE — ED Notes (Signed)
CBG: 596 

## 2013-07-07 NOTE — ED Provider Notes (Signed)
CSN: [id number]     Arrival date & time 07/07/13  1719 History   First MD Initiated Contact with Patient 07/07/13 1806     Chief Complaint  Patient presents with  . Hyperglycemia   (Consider location/radiation/quality/duration/timing/severity/associated sxs/prior Treatment) The history is provided by the patient.  JORRELL KUSTER is a 57 y.o. male hx of asthma, DM, HL here with cough and hyperglycemia. He has history of bronchitis and has been coughing for the last several days. Coughing up some yellowish and greenish sputum. Meds any fevers or chills. Denies any nausea vomiting. He has been taking his Janumet but states that his sugar has been running in the 400-500 range. Still smokes as well. Denies any abdominal pain.    Past Medical History  Diagnosis Date  . ALLERGIC RHINITIS 03/18/2009  . ANXIETY 06/24/2007  . ASTHMA 06/24/2007  . BREAST PAIN, LEFT 07/21/2008  . DEPRESSION 06/24/2007  . DIABETES MELLITUS, TYPE II 07/21/2008  . GERD 08/25/2008  . HYPERLIPIDEMIA 07/21/2008  . INSOMNIA-SLEEP DISORDER-UNSPEC 03/28/2010  . LOW BACK PAIN 09/03/2007  . NAUSEA 06/14/2010  . OSTEOARTHRITIS, KNEES, BILATERAL 08/25/2008  . PERIPHERAL EDEMA 07/21/2008  . SYMPTOM, ABNORMAL INVOLUNTARY MOVEMENT NEC 06/21/2007  . Wheezing 07/13/2009  . COPD (chronic obstructive pulmonary disease) 03/28/2012  . Colon polyps 03/28/2012   Past Surgical History  Procedure Laterality Date  . Right knee surgury      x 9  . Rotator cuff repair      bilateral  . Inguinal herniorrhapy    . Appendectomy    . Cholecystectomy    . Tonsillectomy    . Total knee arthroplasty      right  . Knee arthroscopy      left   Family History  Problem Relation Age of Onset  . Coronary artery disease Other   . Emphysema Mother     never smoker but spouse smoked  . Allergies Son   . Asthma Maternal Grandmother   . Rheum arthritis Sister    History  Substance Use Topics  . Smoking status: Current Every Day Smoker -- 0.50 packs/day  for 35 years    Types: Cigarettes    Last Attempt to Quit: 12/02/2012  . Smokeless tobacco: Never Used  . Alcohol Use: No    Review of Systems  HENT: Positive for congestion.   Respiratory: Positive for cough.   All other systems reviewed and are negative.    Allergies  Review of patient's allergies indicates no known allergies.  Home Medications   Current Outpatient Rx  Name  Route  Sig  Dispense  Refill  . albuterol (VENTOLIN HFA) 108 (90 BASE) MCG/ACT inhaler   Inhalation   Inhale 2 puffs into the lungs every 6 (six) hours as needed.         Marland Kitchen aspirin 81 MG EC tablet   Oral   Take 81 mg by mouth daily.           . budesonide-formoterol (SYMBICORT) 160-4.5 MCG/ACT inhaler      Take 2 puffs first thing in am and then another 2 puffs about 12 hours later.   1 Inhaler   12   . divalproex (DEPAKOTE ER) 500 MG 24 hr tablet   Oral   Take 500 mg by mouth daily.         Marland Kitchen HYDROcodone-acetaminophen (NORCO) 10-325 MG per tablet   Oral   Take 1 tablet by mouth every 6 (six) hours as needed.   120 tablet  5   . ipratropium (ATROVENT HFA) 17 MCG/ACT inhaler   Inhalation   Inhale 2 puffs into the lungs every 6 (six) hours.         Marland Kitchen ipratropium-albuterol (DUONEB) 0.5-2.5 (3) MG/3ML SOLN   Nebulization   Take 3 mLs by nebulization 4 (four) times daily as needed.   360 mL   3   . montelukast (SINGULAIR) 10 MG tablet   Oral   Take 1 tablet (10 mg total) by mouth at bedtime.   90 tablet   3   . QUEtiapine (SEROQUEL) 300 MG tablet   Oral   Take 300 mg by mouth at bedtime.         . simvastatin (ZOCOR) 20 MG tablet   Oral   Take 10 mg by mouth every evening.         . sitaGLIPtan-metformin (JANUMET) 50-1000 MG per tablet   Oral   Take 1 tablet by mouth 2 (two) times daily.   180 tablet   3   . tiotropium (SPIRIVA) 18 MCG inhalation capsule   Inhalation   Place 18 mcg into inhaler and inhale daily.            BP 104/64  Pulse 72  Temp(Src)  97.6 F (36.4 C) (Oral)  SpO2 95% Physical Exam  Nursing note and vitals reviewed. Constitutional: He is oriented to person, place, and time. He appears well-developed and well-nourished.  Coughing   HENT:  Head: Normocephalic.  MM slightly dry   Eyes: Conjunctivae are normal. Pupils are equal, round, and reactive to light.  Neck: Normal range of motion. Neck supple.  Cardiovascular: Normal rate, regular rhythm and normal heart sounds.   Pulmonary/Chest: Effort normal.  + wheezing   Abdominal: Soft. Bowel sounds are normal. He exhibits no distension. There is no tenderness. There is no rebound and no guarding.  Musculoskeletal: Normal range of motion.  Neurological: He is alert and oriented to person, place, and time.  Skin: Skin is warm and dry.  Psychiatric: He has a normal mood and affect. His behavior is normal. Judgment and thought content normal.    ED Course  Procedures (including critical care time) Labs Review Labs Reviewed  CBC WITH DIFFERENTIAL - Abnormal; Notable for the following:    Platelets 137 (*)    All other components within normal limits  COMPREHENSIVE METABOLIC PANEL - Abnormal; Notable for the following:    Sodium 128 (*)    Chloride 93 (*)    Glucose, Bld 632 (*)    Alkaline Phosphatase 162 (*)    All other components within normal limits  URINALYSIS, ROUTINE W REFLEX MICROSCOPIC - Abnormal; Notable for the following:    Specific Gravity, Urine 1.041 (*)    Glucose, UA >1000 (*)    All other components within normal limits  GLUCOSE, CAPILLARY - Abnormal; Notable for the following:    Glucose-Capillary 424 (*)    All other components within normal limits  GLUCOSE, CAPILLARY - Abnormal; Notable for the following:    Glucose-Capillary 264 (*)    All other components within normal limits  URINE MICROSCOPIC-ADD ON   Imaging Review Dg Chest 2 View  07/07/2013   *RADIOLOGY REPORT*  Clinical Data: Cough  CHEST - 2 VIEW  Comparison: 03/25/2013   Findings: Normal heart size.  Clear lungs.  No pleural effusion or pneumothorax.  IMPRESSION: No active cardiopulmonary disease.   Original Report Authenticated By: Jolaine Click, M.D.    MDM  No diagnosis  found. NAFTULI DALSANTO is a 57 y.o. male here with hyperglycemia and cough. Will need to r/o DKA vs pneumonia. Will get labs and CXR. Patient refused nebs but wants steroids. I told him that steroids will likely increase his blood sugar but he said that its the only thing that has helped him in the past.    9:32 PM After 2 L NS and 10 U SQ insulin, CBG dec from 632 to 264. No signs of DKA. He wants prednisone so I gave him low dose 40 mg and will d/c home with same. I told him to check his sugar regularly and see his doctor in a week.     Richardean Canal, MD 07/07/13 2134

## 2013-07-08 LAB — GLUCOSE, CAPILLARY: Glucose-Capillary: 596 mg/dL (ref 70–99)

## 2013-07-12 ENCOUNTER — Emergency Department (HOSPITAL_COMMUNITY)
Admission: EM | Admit: 2013-07-12 | Discharge: 2013-07-12 | Disposition: A | Discharge status: Home / Self Care | Payer: Medicare Other | Attending: Emergency Medicine | Admitting: Emergency Medicine

## 2013-07-12 ENCOUNTER — Encounter (HOSPITAL_COMMUNITY): Payer: Self-pay | Admitting: Emergency Medicine

## 2013-07-12 DIAGNOSIS — F3289 Other specified depressive episodes: Secondary | ICD-10-CM | POA: Insufficient documentation

## 2013-07-12 DIAGNOSIS — J45901 Unspecified asthma with (acute) exacerbation: Secondary | ICD-10-CM | POA: Insufficient documentation

## 2013-07-12 DIAGNOSIS — J441 Chronic obstructive pulmonary disease with (acute) exacerbation: Secondary | ICD-10-CM | POA: Insufficient documentation

## 2013-07-12 DIAGNOSIS — F17200 Nicotine dependence, unspecified, uncomplicated: Secondary | ICD-10-CM | POA: Insufficient documentation

## 2013-07-12 DIAGNOSIS — Z8719 Personal history of other diseases of the digestive system: Secondary | ICD-10-CM | POA: Insufficient documentation

## 2013-07-12 DIAGNOSIS — Z860100 Personal history of colon polyps, unspecified: Secondary | ICD-10-CM | POA: Insufficient documentation

## 2013-07-12 DIAGNOSIS — F329 Major depressive disorder, single episode, unspecified: Secondary | ICD-10-CM | POA: Insufficient documentation

## 2013-07-12 DIAGNOSIS — M1710 Unilateral primary osteoarthritis, unspecified knee: Secondary | ICD-10-CM | POA: Insufficient documentation

## 2013-07-12 DIAGNOSIS — E785 Hyperlipidemia, unspecified: Secondary | ICD-10-CM | POA: Insufficient documentation

## 2013-07-12 DIAGNOSIS — F411 Generalized anxiety disorder: Secondary | ICD-10-CM | POA: Insufficient documentation

## 2013-07-12 DIAGNOSIS — E119 Type 2 diabetes mellitus without complications: Secondary | ICD-10-CM | POA: Insufficient documentation

## 2013-07-12 DIAGNOSIS — IMO0002 Reserved for concepts with insufficient information to code with codable children: Secondary | ICD-10-CM | POA: Insufficient documentation

## 2013-07-12 DIAGNOSIS — R739 Hyperglycemia, unspecified: Secondary | ICD-10-CM

## 2013-07-12 DIAGNOSIS — Z7982 Long term (current) use of aspirin: Secondary | ICD-10-CM | POA: Insufficient documentation

## 2013-07-12 LAB — GLUCOSE, CAPILLARY
Glucose-Capillary: 478 mg/dL — ABNORMAL HIGH (ref 70–99)
Glucose-Capillary: >600 mg/dL (ref 70–99)

## 2013-07-12 LAB — POCT I-STAT, CHEM 8
HCT: 46 % (ref 39.0–52.0)
Hemoglobin: 15.6 g/dL (ref 13.0–17.0)
Potassium: 4.5 mEq/L (ref 3.5–5.1)
Sodium: 128 mEq/L — ABNORMAL LOW (ref 135–145)
TCO2: 19 mmol/L (ref 0–100)

## 2013-07-12 MED ORDER — SODIUM CHLORIDE 0.9 % IV SOLN
1000.0000 mL | Freq: Once | INTRAVENOUS | Status: AC
  Administered 2013-07-12: 1000 mL via INTRAVENOUS

## 2013-07-12 MED ORDER — INSULIN ASPART 100 UNIT/ML ~~LOC~~ SOLN
10.0000 [IU] | Freq: Once | SUBCUTANEOUS | Status: AC
  Administered 2013-07-12: 10 [IU] via SUBCUTANEOUS
  Filled 2013-07-12: qty 1

## 2013-07-12 MED ORDER — SODIUM CHLORIDE 0.9 % IV SOLN: 1000.0000 mL | INTRAVENOUS | Status: DC

## 2013-07-12 MED ORDER — SODIUM CHLORIDE 0.9 % IV BOLUS (SEPSIS)
1000.0000 mL | Freq: Once | INTRAVENOUS | Status: AC
  Administered 2013-07-12: 1000 mL via INTRAVENOUS

## 2013-07-12 NOTE — ED Provider Notes (Signed)
CSN: [id number]     Arrival date & time 07/12/13  0008 History   First MD Initiated Contact with Patient 07/12/13 0014     Chief Complaint  Patient presents with  . Hyperglycemia   (Consider location/radiation/quality/duration/timing/severity/associated sxs/prior Treatment) HPI 57 year old male presents to emergency room with complaint of elevated blood sugar.  Patient is a type II diabetic, currently on Janumet.  Patient was seen earlier in the week for bronchitis, exacerbation.  Patient requested prednisone as this helps with his bronchitis.  Patient was advised that he would have elevated blood sugars if he was placed on steroids.  He reports he took the last dose of prednisone today.  He checked his sugar at home, and it was over 600.  He denies any side effects.  Denies any thirst, increased urination, and fatigue, blurred vision.  Patient has followup with his primary care Dr. and roughly 2 weeks.  Patient reports his primary care doctor was considering placing him on additional medications for his diabetes.  At patient's ED visit earlier this week, patient was noted to have hyperglycemia.  He received IV fluids, and subcutaneous insulin, which brought his sugar below 300. Past Medical History  Diagnosis Date  . ALLERGIC RHINITIS 03/18/2009  . ANXIETY 06/24/2007  . ASTHMA 06/24/2007  . BREAST PAIN, LEFT 07/21/2008  . DEPRESSION 06/24/2007  . DIABETES MELLITUS, TYPE II 07/21/2008  . GERD 08/25/2008  . HYPERLIPIDEMIA 07/21/2008  . INSOMNIA-SLEEP DISORDER-UNSPEC 03/28/2010  . LOW BACK PAIN 09/03/2007  . NAUSEA 06/14/2010  . OSTEOARTHRITIS, KNEES, BILATERAL 08/25/2008  . PERIPHERAL EDEMA 07/21/2008  . SYMPTOM, ABNORMAL INVOLUNTARY MOVEMENT NEC 06/21/2007  . Wheezing 07/13/2009  . COPD (chronic obstructive pulmonary disease) 03/28/2012  . Colon polyps 03/28/2012   Past Surgical History  Procedure Laterality Date  . Right knee surgury      x 9  . Rotator cuff repair      bilateral  . Inguinal  herniorrhapy    . Appendectomy    . Cholecystectomy    . Tonsillectomy    . Total knee arthroplasty      right  . Knee arthroscopy      left   Family History  Problem Relation Age of Onset  . Coronary artery disease Other   . Emphysema Mother     never smoker but spouse smoked  . Allergies Son   . Asthma Maternal Grandmother   . Rheum arthritis Sister    History  Substance Use Topics  . Smoking status: Current Every Day Smoker -- 0.50 packs/day for 35 years    Types: Cigarettes    Last Attempt to Quit: 12/02/2012  . Smokeless tobacco: Never Used  . Alcohol Use: No    Review of Systems  See History of Present Illness; otherwise all other systems are reviewed and negative  Allergies  Review of patient's allergies indicates no known allergies.  Home Medications   Current Outpatient Rx  Name  Route  Sig  Dispense  Refill  . albuterol (VENTOLIN HFA) 108 (90 BASE) MCG/ACT inhaler   Inhalation   Inhale 2 puffs into the lungs every 6 (six) hours as needed for shortness of breath.          Marland Kitchen aspirin 81 MG EC tablet   Oral   Take 81 mg by mouth daily.           . budesonide-formoterol (SYMBICORT) 160-4.5 MCG/ACT inhaler      Take 2 puffs first thing in am and then another  2 puffs about 12 hours later.   1 Inhaler   12   . divalproex (DEPAKOTE ER) 500 MG 24 hr tablet   Oral   Take 500 mg by mouth daily.         Marland Kitchen HYDROcodone-acetaminophen (NORCO) 10-325 MG per tablet   Oral   Take 1 tablet by mouth every 6 (six) hours as needed.   120 tablet   5   . ipratropium (ATROVENT HFA) 17 MCG/ACT inhaler   Inhalation   Inhale 2 puffs into the lungs every 6 (six) hours as needed for wheezing.          Marland Kitchen ipratropium-albuterol (DUONEB) 0.5-2.5 (3) MG/3ML SOLN   Nebulization   Take 3 mLs by nebulization every 6 (six) hours as needed (shortness of breath).         . montelukast (SINGULAIR) 10 MG tablet   Oral   Take 1 tablet (10 mg total) by mouth at bedtime.    90 tablet   3   . QUEtiapine (SEROQUEL) 300 MG tablet   Oral   Take 300 mg by mouth at bedtime.         . simvastatin (ZOCOR) 20 MG tablet   Oral   Take 10 mg by mouth every evening.         . sitaGLIPtan-metformin (JANUMET) 50-1000 MG per tablet   Oral   Take 1 tablet by mouth 2 (two) times daily.   180 tablet   3   . tiotropium (SPIRIVA) 18 MCG inhalation capsule   Inhalation   Place 18 mcg into inhaler and inhale daily.           . predniSONE (DELTASONE) 20 MG tablet   Oral   Take 2 tablets (40 mg total) by mouth daily.   8 tablet   0    BP 136/86  Pulse 112  Resp 18  SpO2 95% Physical Exam  Nursing note and vitals reviewed. Constitutional: He is oriented to person, place, and time. He appears well-developed and well-nourished. No distress.  HENT:  Head: Normocephalic and atraumatic.  Nose: Nose normal.  Mouth/Throat: Oropharynx is clear and moist.  Eyes: Conjunctivae and EOM are normal. Pupils are equal, round, and reactive to light.  Neck: Normal range of motion. Neck supple. No JVD present. No tracheal deviation present. No thyromegaly present.  Cardiovascular: Normal rate, regular rhythm, normal heart sounds and intact distal pulses.  Exam reveals no gallop and no friction rub.   No murmur heard. Pulmonary/Chest: Effort normal and breath sounds normal. No stridor. No respiratory distress. He has no wheezes. He has no rales. He exhibits no tenderness.  Abdominal: Soft. Bowel sounds are normal. He exhibits no distension and no mass. There is no tenderness. There is no rebound and no guarding.  Musculoskeletal: Normal range of motion. He exhibits no edema and no tenderness.  Lymphadenopathy:    He has no cervical adenopathy.  Neurological: He is alert and oriented to person, place, and time. A cranial nerve deficit is present. He exhibits normal muscle tone. Coordination normal.  Skin: Skin is warm and dry. No rash noted. No erythema. No pallor.   Psychiatric: He has a normal mood and affect. His behavior is normal. Judgment and thought content normal.    ED Course  Procedures (including critical care time) Labs Review Labs Reviewed  GLUCOSE, CAPILLARY - Abnormal; Notable for the following:    Glucose-Capillary >600 (*)    All other components within normal limits  GLUCOSE, CAPILLARY - Abnormal; Notable for the following:    Glucose-Capillary 478 (*)    All other components within normal limits  POCT I-STAT, CHEM 8 - Abnormal; Notable for the following:    Sodium 128 (*)    Glucose, Bld >700 (*)    All other components within normal limits   Imaging Review No results found.  MDM   1. Hyperglycemia    57 year old male with uncomplicated hyperglycemia.  No signs of DKA.  He has seen.  No hyponatremia, associated with his hyperglycemia.  Will plan to give subcutaneous insulin and IV fluids.  Patient reports he is able to be seen at a walk-in clinic associated with his primary care doctor's office tomorrow.    Olivia Mackie, MD 07/12/13 [phone]

## 2013-07-12 NOTE — ED Notes (Signed)
Patient states he will follow-up with Dr. Azucena Cecil in later this morning.

## 2013-07-12 NOTE — ED Notes (Signed)
Pt took blood sugar at home and it was 600.

## 2013-07-12 NOTE — ED Notes (Signed)
Pt. CBG 478. RN, Orvan Falconer and EDP, Norlene Campbell, MD made aware.

## 2013-07-12 NOTE — ED Notes (Signed)
Dr. Norlene Campbell aware of I stat 8 critical result. Glucose greater than 700

## 2013-08-28 ENCOUNTER — Other Ambulatory Visit: Payer: Self-pay

## 2013-09-08 ENCOUNTER — Ambulatory Visit: Payer: Medicare Other | Admitting: Dietician

## 2013-10-28 ENCOUNTER — Other Ambulatory Visit: Payer: Self-pay | Admitting: Family Medicine

## 2013-12-15 ENCOUNTER — Telehealth: Payer: Self-pay | Admitting: *Deleted

## 2014-01-20 ENCOUNTER — Emergency Department (HOSPITAL_COMMUNITY): Payer: Medicare Other

## 2014-01-20 ENCOUNTER — Emergency Department (HOSPITAL_COMMUNITY)
Admission: EM | Admit: 2014-01-20 | Discharge: 2014-01-20 | Disposition: A | Discharge status: Home / Self Care | Payer: Medicare Other | Attending: Emergency Medicine | Admitting: Emergency Medicine

## 2014-01-20 ENCOUNTER — Encounter (HOSPITAL_COMMUNITY): Payer: Self-pay | Admitting: Emergency Medicine

## 2014-01-20 DIAGNOSIS — F3289 Other specified depressive episodes: Secondary | ICD-10-CM | POA: Insufficient documentation

## 2014-01-20 DIAGNOSIS — M1710 Unilateral primary osteoarthritis, unspecified knee: Secondary | ICD-10-CM | POA: Insufficient documentation

## 2014-01-20 DIAGNOSIS — M25569 Pain in unspecified knee: Secondary | ICD-10-CM

## 2014-01-20 DIAGNOSIS — Z79899 Other long term (current) drug therapy: Secondary | ICD-10-CM | POA: Insufficient documentation

## 2014-01-20 DIAGNOSIS — J4489 Other specified chronic obstructive pulmonary disease: Secondary | ICD-10-CM | POA: Insufficient documentation

## 2014-01-20 DIAGNOSIS — G4700 Insomnia, unspecified: Secondary | ICD-10-CM | POA: Insufficient documentation

## 2014-01-20 DIAGNOSIS — J449 Chronic obstructive pulmonary disease, unspecified: Secondary | ICD-10-CM | POA: Insufficient documentation

## 2014-01-20 DIAGNOSIS — Z96659 Presence of unspecified artificial knee joint: Secondary | ICD-10-CM | POA: Insufficient documentation

## 2014-01-20 DIAGNOSIS — Z9889 Other specified postprocedural states: Secondary | ICD-10-CM | POA: Insufficient documentation

## 2014-01-20 DIAGNOSIS — IMO0002 Reserved for concepts with insufficient information to code with codable children: Secondary | ICD-10-CM | POA: Insufficient documentation

## 2014-01-20 DIAGNOSIS — F329 Major depressive disorder, single episode, unspecified: Secondary | ICD-10-CM | POA: Insufficient documentation

## 2014-01-20 DIAGNOSIS — F411 Generalized anxiety disorder: Secondary | ICD-10-CM | POA: Insufficient documentation

## 2014-01-20 DIAGNOSIS — Z794 Long term (current) use of insulin: Secondary | ICD-10-CM | POA: Insufficient documentation

## 2014-01-20 DIAGNOSIS — F17200 Nicotine dependence, unspecified, uncomplicated: Secondary | ICD-10-CM | POA: Insufficient documentation

## 2014-01-20 DIAGNOSIS — K219 Gastro-esophageal reflux disease without esophagitis: Secondary | ICD-10-CM | POA: Insufficient documentation

## 2014-01-20 DIAGNOSIS — E119 Type 2 diabetes mellitus without complications: Secondary | ICD-10-CM | POA: Insufficient documentation

## 2014-01-20 DIAGNOSIS — E785 Hyperlipidemia, unspecified: Secondary | ICD-10-CM | POA: Insufficient documentation

## 2014-01-20 DIAGNOSIS — Z7982 Long term (current) use of aspirin: Secondary | ICD-10-CM | POA: Insufficient documentation

## 2014-01-20 MED ORDER — OXYCODONE-ACETAMINOPHEN 5-325 MG PO TABS
2.0000 | ORAL_TABLET | Freq: Once | ORAL | Status: AC
  Administered 2014-01-20: 2 via ORAL
  Filled 2014-01-20: qty 2

## 2014-01-20 NOTE — ED Notes (Signed)
Pt c/o rt knee pain x 1 month.  Denies injury.  Thinks it may be arthritis.  States that he had a knee replacement 11 years ago.

## 2014-01-20 NOTE — ED Provider Notes (Signed)
CSN: [id number]     Arrival date & time 01/20/14  1610 History   This chart was scribed for non-physician practitioner Carlisle Cater, PA-C, working with Richarda Blade, MD, by Marlowe Kays, ED Scribe. This patient was seen in room WTR6/WTR6 and the patient's care was started at 6:22 PM.    Chief Complaint  Patient presents with  . Knee Pain    The history is provided by the patient. No language interpreter was used.   HPI Comments: Chase Gibson is a 58 y.o. Male, with a h/o DM, arthritis, and R total knee arthroplasty, who presents to the Emergency Department complaining of worsening lateral right knee pain which has persisted for a month. He reports hearing a popping noise when he walks that is a new symptom for him. He states he has been applying cold and hot compressions and taking Vicodin with no relief. He states he had a knee sleeve that seemed to help, but no longer has it. Pt is ambulatory with pain. He denies recent a recent injury. He denies any fever, nausea, or vomiting. He states his knee replacement was done locally approximately 11 years ago. Pt states he is allergic to NSAIDs.   Past Medical History  Diagnosis Date  . ALLERGIC RHINITIS 03/18/2009  . ANXIETY 06/24/2007  . ASTHMA 06/24/2007  . BREAST PAIN, LEFT 07/21/2008  . DEPRESSION 06/24/2007  . DIABETES MELLITUS, TYPE II 07/21/2008  . GERD 08/25/2008  . HYPERLIPIDEMIA 07/21/2008  . INSOMNIA-SLEEP DISORDER-UNSPEC 03/28/2010  . LOW BACK PAIN 09/03/2007  . NAUSEA 06/14/2010  . OSTEOARTHRITIS, KNEES, BILATERAL 08/25/2008  . PERIPHERAL EDEMA 07/21/2008  . SYMPTOM, ABNORMAL INVOLUNTARY MOVEMENT NEC 06/21/2007  . Wheezing 07/13/2009  . COPD (chronic obstructive pulmonary disease) 03/28/2012  . Colon polyps 03/28/2012   Past Surgical History  Procedure Laterality Date  . Right knee surgury      x 9  . Rotator cuff repair      bilateral  . Inguinal herniorrhapy    . Appendectomy    . Cholecystectomy    . Tonsillectomy    .  Total knee arthroplasty      right  . Knee arthroscopy      left   Family History  Problem Relation Age of Onset  . Coronary artery disease Other   . Emphysema Mother     never smoker but spouse smoked  . Allergies Son   . Asthma Maternal Grandmother   . Rheum arthritis Sister    History  Substance Use Topics  . Smoking status: Current Every Day Smoker -- 0.50 packs/day for 35 years    Types: Cigarettes    Last Attempt to Quit: 12/02/2012  . Smokeless tobacco: Never Used  . Alcohol Use: No    Review of Systems  Constitutional: Negative for fever and activity change.  Gastrointestinal: Negative for nausea and vomiting.  Musculoskeletal: Positive for arthralgias (Right knee pain). Negative for back pain, gait problem, joint swelling and neck pain.  Skin: Negative for wound.  Neurological: Negative for weakness and numbness.    Allergies  Nsaids  Home Medications   Current Outpatient Rx  Name  Route  Sig  Dispense  Refill  . albuterol (VENTOLIN HFA) 108 (90 BASE) MCG/ACT inhaler   Inhalation   Inhale 2 puffs into the lungs every 6 (six) hours as needed for shortness of breath.          Marland Kitchen aspirin EC 325 MG tablet   Oral   Take 325  mg by mouth daily.         . budesonide-formoterol (SYMBICORT) 160-4.5 MCG/ACT inhaler      Take 2 puffs first thing in am and then another 2 puffs about 12 hours later.   1 Inhaler   12   . divalproex (DEPAKOTE ER) 500 MG 24 hr tablet   Oral   Take 500 mg by mouth daily.         Marland Kitchen HYDROcodone-acetaminophen (NORCO) 10-325 MG per tablet   Oral   Take 1 tablet by mouth every 6 (six) hours as needed for moderate pain.         Marland Kitchen insulin glargine (LANTUS) 100 UNIT/ML injection   Subcutaneous   Inject 26 Units into the skin daily.         Marland Kitchen ipratropium (ATROVENT HFA) 17 MCG/ACT inhaler   Inhalation   Inhale 2 puffs into the lungs every 6 (six) hours as needed for wheezing.          Marland Kitchen ipratropium-albuterol (DUONEB)  0.5-2.5 (3) MG/3ML SOLN   Nebulization   Take 3 mLs by nebulization every 6 (six) hours as needed (shortness of breath).         . montelukast (SINGULAIR) 10 MG tablet   Oral   Take 10 mg by mouth every morning.         Marland Kitchen QUEtiapine (SEROQUEL) 300 MG tablet   Oral   Take 300 mg by mouth at bedtime.         . simvastatin (ZOCOR) 20 MG tablet   Oral   Take 10 mg by mouth every evening.         . sitaGLIPtan-metformin (JANUMET) 50-1000 MG per tablet   Oral   Take 1 tablet by mouth 2 (two) times daily.   180 tablet   3   . tiotropium (SPIRIVA) 18 MCG inhalation capsule   Inhalation   Place 18 mcg into inhaler and inhale daily.            Triage Vitals: BP 111/82  Pulse 79  Temp(Src) 98.1 F (36.7 C) (Oral)  Resp 18  SpO2 100%  Physical Exam  Nursing note and vitals reviewed. Constitutional: He appears well-developed and well-nourished. No distress.  HENT:  Head: Normocephalic and atraumatic.  Eyes: Conjunctivae and EOM are normal.  Neck: Normal range of motion. Neck supple. No tracheal deviation present.  Cardiovascular: Normal rate and normal pulses.   Pulmonary/Chest: Effort normal. No respiratory distress.  Musculoskeletal: Normal range of motion. He exhibits no edema and no tenderness.  Posterior and lateral joint line pain of the right knee.   Neurological: He is alert. No sensory deficit.  Motor, sensation, and vascular distal to the injury is fully intact.   Skin: Skin is warm and dry.  Psychiatric: He has a normal mood and affect. His behavior is normal.    ED Course  Procedures (including critical care time)  DIAGNOSTIC STUDIES: Oxygen Saturation is 100% on room air, normal by my interpretation.    COORDINATION OF CARE: 6:28 PM- Will refer to orthopedist and prescribe Percocet. Pt verbalizes understanding and agrees to plan.  Labs Review Labs Reviewed - No data to display Imaging Review Dg Knee Complete 4 Views Right  01/20/2014    CLINICAL DATA:  Right knee pain. History of knee replacement 11 years ago.  EXAM: RIGHT KNEE - COMPLETE 4+ VIEW  COMPARISON:  Plain films right knee 05/28/2009.  FINDINGS: Right total knee arthroplasty is in place. The device  is located. There is no fracture. No evidence of hardware loosening is identified. No focal bony lesion is seen.  IMPRESSION: Status post right knee replacement without evidence of complication. Stable compared to prior exam.   Electronically Signed   By: Inge Rise M.D.   On: 01/20/2014 17:40     EKG Interpretation None      Patient seen and examined. Medications ordered.   Vital signs reviewed and are as follows: Filed Vitals:   01/20/14 1855  BP:   Pulse: 75  Temp:   Resp: 18  BP 111/82  Pulse 75  Temp(Src) 98.1 F (36.7 C) (Oral)  Resp 18  SpO2 95%  X-ray results reviewed with patient.  Patient was counseled on RICE protocol and told to rest injury, use ice for no longer than 15 minutes every hour, compress the area, and elevate above the level of their heart as much as possible to reduce swelling. Questions answered. Patient verbalized understanding.    Patient provided with knee sleeve prior to discharge. Patient will use some Vicodin for pain control.   MDM   Final diagnoses:  Knee pain   Patient with knee pain x1 month. History of knee replacement. X-rays negative. Patient has knee pain, he is unable to take NSAIDs. His been taking Vicodin at home. Knee sleeve has helped. Question arthritis. Leg is neurovascularly intact. He is able to follow up with an orthopedist for further evaluation.  I personally performed the services described in this documentation, which was scribed in my presence. The recorded information has been reviewed and is accurate.     Carlisle Cater, PA-C 01/20/14 1922

## 2014-01-20 NOTE — ED Provider Notes (Signed)
Medical screening examination/treatment/procedure(s) were performed by non-physician practitioner and as supervising physician I was immediately available for consultation/collaboration.  Richarda Blade, MD 01/20/14 [phone]

## 2014-01-20 NOTE — Discharge Instructions (Signed)
Please read and follow all provided instructions.  Your diagnoses today include:  1. Knee pain     Tests performed today include:  An x-ray of the affected area - does NOT show any broken bones  Vital signs. See below for your results today.   Medications prescribed:   None  Take any prescribed medications only as directed.  Home care instructions:   Follow any educational materials contained in this packet  Follow R.I.C.E. Protocol:  R - rest your injury   I  - use ice on injury without applying directly to skin  C - compress injury with bandage or splint  E - elevate the injury as much as possible  Follow-up instructions: Please follow-up with your orthopedic physician (bone specialist) in 1 week.   If you do not have a primary care doctor -- see below for referral information.   Return instructions:   Please return if your toes are numb or tingling, appear gray or blue, or you have severe pain (also elevate leg and loosen splint or wrap if you were given one)  Please return to the Emergency Department if you experience worsening symptoms.   Please return if you have any other emergent concerns.  Additional Information:  Your vital signs today were: BP 111/82   Pulse 79   Temp(Src) 98.1 F (36.7 C) (Oral)   Resp 18   SpO2 100% If your blood pressure (BP) was elevated above 135/85 this visit, please have this repeated by your doctor within one month. --------------

## 2014-03-29 ENCOUNTER — Emergency Department (HOSPITAL_COMMUNITY): Payer: Medicare Other

## 2014-03-29 ENCOUNTER — Encounter (HOSPITAL_COMMUNITY): Payer: Self-pay | Admitting: Emergency Medicine

## 2014-03-29 ENCOUNTER — Emergency Department (HOSPITAL_COMMUNITY)
Admission: EM | Admit: 2014-03-29 | Discharge: 2014-03-29 | Disposition: A | Discharge status: Home / Self Care | Payer: Medicare Other | Attending: Emergency Medicine | Admitting: Emergency Medicine

## 2014-03-29 DIAGNOSIS — Z860100 Personal history of colon polyps, unspecified: Secondary | ICD-10-CM | POA: Insufficient documentation

## 2014-03-29 DIAGNOSIS — E785 Hyperlipidemia, unspecified: Secondary | ICD-10-CM | POA: Insufficient documentation

## 2014-03-29 DIAGNOSIS — W1809XA Striking against other object with subsequent fall, initial encounter: Secondary | ICD-10-CM | POA: Insufficient documentation

## 2014-03-29 DIAGNOSIS — E119 Type 2 diabetes mellitus without complications: Secondary | ICD-10-CM | POA: Insufficient documentation

## 2014-03-29 DIAGNOSIS — IMO0002 Reserved for concepts with insufficient information to code with codable children: Secondary | ICD-10-CM | POA: Insufficient documentation

## 2014-03-29 DIAGNOSIS — Z794 Long term (current) use of insulin: Secondary | ICD-10-CM | POA: Insufficient documentation

## 2014-03-29 DIAGNOSIS — F411 Generalized anxiety disorder: Secondary | ICD-10-CM | POA: Insufficient documentation

## 2014-03-29 DIAGNOSIS — Y929 Unspecified place or not applicable: Secondary | ICD-10-CM | POA: Insufficient documentation

## 2014-03-29 DIAGNOSIS — Z8719 Personal history of other diseases of the digestive system: Secondary | ICD-10-CM | POA: Insufficient documentation

## 2014-03-29 DIAGNOSIS — Z7982 Long term (current) use of aspirin: Secondary | ICD-10-CM | POA: Insufficient documentation

## 2014-03-29 DIAGNOSIS — J449 Chronic obstructive pulmonary disease, unspecified: Secondary | ICD-10-CM | POA: Insufficient documentation

## 2014-03-29 DIAGNOSIS — J4489 Other specified chronic obstructive pulmonary disease: Secondary | ICD-10-CM | POA: Insufficient documentation

## 2014-03-29 DIAGNOSIS — F329 Major depressive disorder, single episode, unspecified: Secondary | ICD-10-CM | POA: Insufficient documentation

## 2014-03-29 DIAGNOSIS — F3289 Other specified depressive episodes: Secondary | ICD-10-CM | POA: Insufficient documentation

## 2014-03-29 DIAGNOSIS — M1710 Unilateral primary osteoarthritis, unspecified knee: Secondary | ICD-10-CM | POA: Insufficient documentation

## 2014-03-29 DIAGNOSIS — Y9389 Activity, other specified: Secondary | ICD-10-CM | POA: Insufficient documentation

## 2014-03-29 DIAGNOSIS — Z87448 Personal history of other diseases of urinary system: Secondary | ICD-10-CM | POA: Insufficient documentation

## 2014-03-29 DIAGNOSIS — Z79899 Other long term (current) drug therapy: Secondary | ICD-10-CM | POA: Insufficient documentation

## 2014-03-29 DIAGNOSIS — S5000XA Contusion of unspecified elbow, initial encounter: Secondary | ICD-10-CM

## 2014-03-29 DIAGNOSIS — F17200 Nicotine dependence, unspecified, uncomplicated: Secondary | ICD-10-CM | POA: Insufficient documentation

## 2014-03-29 MED ORDER — OXYCODONE-ACETAMINOPHEN 5-325 MG PO TABS: 1.0000 | ORAL_TABLET | Freq: Four times a day (QID) | ORAL | Status: DC | PRN

## 2014-03-29 NOTE — ED Provider Notes (Signed)
CSN: [id number]     Arrival date & time 03/29/14  1539 History   First MD Initiated Contact with Patient 03/29/14 1545 This chart was scribed for non-physician practitioner Delos Haring, PA-C working with Leota Jacobsen, MD by Anastasia Pall, ED scribe. This patient was seen in room WTR5/WTR5 and the patient's care was started at 4:49 PM.     Chief Complaint  Patient presents with  . Elbow Pain    left   (Consider location/radiation/quality/duration/timing/severity/associated sxs/prior Treatment) The history is provided by the patient. No language interpreter was used.   HPI Comments: MAGNUM LUNDE is a 58 y.o. male who presents to the Emergency Department complaining of constant left elbow pain with associated swelling, onset today after his knee give out on him and he hit his left elbow. He reports 1 week ago he was spooked by his horse and hit his left elbow against the side of a building when he jumped back. He reports that palpation and carrying weight with his right arm exacerbates his pain. He reports that he has been wearing an elbow sleeve that provided him relief. He states he has tried Vicodin for his pain without relief. He states he is scheduled for knee surgery 06/25. He denies head trauma, LOC, wound, numbness, and any other associated symptoms.   PCP - Gara Kroner, MD  Past Medical History  Diagnosis Date  . ALLERGIC RHINITIS 03/18/2009  . ANXIETY 06/24/2007  . ASTHMA 06/24/2007  . BREAST PAIN, LEFT 07/21/2008  . DEPRESSION 06/24/2007  . DIABETES MELLITUS, TYPE II 07/21/2008  . GERD 08/25/2008  . HYPERLIPIDEMIA 07/21/2008  . INSOMNIA-SLEEP DISORDER-UNSPEC 03/28/2010  . LOW BACK PAIN 09/03/2007  . NAUSEA 06/14/2010  . OSTEOARTHRITIS, KNEES, BILATERAL 08/25/2008  . PERIPHERAL EDEMA 07/21/2008  . SYMPTOM, ABNORMAL INVOLUNTARY MOVEMENT NEC 06/21/2007  . Wheezing 07/13/2009  . COPD (chronic obstructive pulmonary disease) 03/28/2012  . Colon polyps 03/28/2012   Past Surgical History   Procedure Laterality Date  . Right knee surgury      x 9  . Rotator cuff repair      bilateral  . Inguinal herniorrhapy    . Appendectomy    . Cholecystectomy    . Tonsillectomy    . Total knee arthroplasty      right  . Knee arthroscopy      left   Family History  Problem Relation Age of Onset  . Coronary artery disease Other   . Emphysema Mother     never smoker but spouse smoked  . Allergies Son   . Asthma Maternal Grandmother   . Rheum arthritis Sister    History  Substance Use Topics  . Smoking status: Current Every Day Smoker -- 0.50 packs/day for 35 years    Types: Cigarettes    Last Attempt to Quit: 12/02/2012  . Smokeless tobacco: Never Used  . Alcohol Use: No    Review of Systems  Musculoskeletal: Positive for arthralgias (right elbow) and joint swelling.  Skin: Negative for wound.  Neurological: Positive for weakness (right LE). Negative for syncope, numbness and headaches.    Allergies  Nsaids  Home Medications   Prior to Admission medications   Medication Sig Start Date End Date Taking? Authorizing Provider  albuterol (VENTOLIN HFA) 108 (90 BASE) MCG/ACT inhaler Inhale 2 puffs into the lungs every 6 (six) hours as needed for shortness of breath.     Historical Provider, MD  aspirin EC 325 MG tablet Take 325 mg by mouth daily.  Historical Provider, MD  budesonide-formoterol (SYMBICORT) 160-4.5 MCG/ACT inhaler Take 2 puffs first thing in am and then another 2 puffs about 12 hours later. 03/25/13   Tanda Rockers, MD  divalproex (DEPAKOTE ER) 500 MG 24 hr tablet Take 500 mg by mouth daily.    Historical Provider, MD  HYDROcodone-acetaminophen (NORCO) 10-325 MG per tablet Take 1 tablet by mouth every 6 (six) hours as needed for moderate pain. 03/28/12   Biagio Borg, MD  insulin glargine (LANTUS) 100 UNIT/ML injection Inject 26 Units into the skin daily.    Historical Provider, MD  ipratropium (ATROVENT HFA) 17 MCG/ACT inhaler Inhale 2 puffs into the lungs  every 6 (six) hours as needed for wheezing.     Historical Provider, MD  ipratropium-albuterol (DUONEB) 0.5-2.5 (3) MG/3ML SOLN Take 3 mLs by nebulization every 6 (six) hours as needed (shortness of breath).    Historical Provider, MD  montelukast (SINGULAIR) 10 MG tablet Take 10 mg by mouth every morning. 09/28/11   Biagio Borg, MD  oxyCODONE-acetaminophen (PERCOCET/ROXICET) 5-325 MG per tablet Take 1 tablet by mouth every 6 (six) hours as needed for severe pain. 03/29/14   Keisi Eckford Marilu Favre, PA-C  QUEtiapine (SEROQUEL) 300 MG tablet Take 300 mg by mouth at bedtime.    Historical Provider, MD  simvastatin (ZOCOR) 20 MG tablet Take 10 mg by mouth every evening.    Historical Provider, MD  sitaGLIPtan-metformin (JANUMET) 50-1000 MG per tablet Take 1 tablet by mouth 2 (two) times daily. 09/28/11   Biagio Borg, MD  tiotropium (SPIRIVA) 18 MCG inhalation capsule Place 18 mcg into inhaler and inhale daily.      Historical Provider, MD   BP 143/76  Pulse 87  Temp(Src) 98 F (36.7 C) (Oral)  Resp 18  SpO2 95% Physical Exam  Nursing note and vitals reviewed. Constitutional: He is oriented to person, place, and time. He appears well-developed and well-nourished. No distress.  HENT:  Head: Normocephalic and atraumatic.  Eyes: Conjunctivae and EOM are normal.  Neck: Normal range of motion. No tracheal deviation present.  Cardiovascular: Normal rate.   Pulmonary/Chest: Effort normal. No respiratory distress.  Musculoskeletal: Normal range of motion. He exhibits edema and tenderness.  Tenderness and swelling to proximal ulnar head. No decreased ROM or strength.  Neurological: He is alert and oriented to person, place, and time.  Physiologic grip strength.   Skin: Skin is warm and dry.  Skin normal.   Psychiatric: He has a normal mood and affect. His behavior is normal.   ED Course  Procedures (including critical care time)  DIAGNOSTIC STUDIES: Oxygen Saturation is 95% on room air, adequate by my  interpretation.    COORDINATION OF CARE: 4:55 PM-Discussed treatment plan with pt at bedside and pt agreed to plan. ACE wrap applied, pt is a fall risk so i have elected not to give him a shoulder sling at this time. He has an orthopedist he see's already for his knee and he can follow-up here about his elbow contusion.   Medications - No data to display  Labs Review Labs Reviewed - No data to display  Imaging Review Dg Elbow Complete Left  03/29/2014   CLINICAL DATA:  Pain post trauma  EXAM: LEFT ELBOW - COMPLETE 3+ VIEW  COMPARISON:  None.  FINDINGS: Frontal, lateral, and bilateral oblique views were obtained. There is no fracture, dislocation, or effusion. There is a small calcification just posterior to the olecranon process of the proximal ulna. This small  calcification is probably of arthropathic etiology.  IMPRESSION: No apparent fracture or dislocation.  No appreciable joint effusion.   Electronically Signed   By: Lowella Grip M.D.   On: 03/29/2014 16:34    EKG Interpretation None      MDM   Final diagnoses:  Elbow contusion    58 y.o.Keller R Naramore's evaluation in the Emergency Department is complete. It has been determined that no acute conditions requiring further emergency intervention are present at this time. The patient/guardian have been advised of the diagnosis and plan. We have discussed signs and symptoms that warrant return to the ED, such as changes or worsening in symptoms.  Vital signs are stable at discharge. Filed Vitals:   03/29/14 1706  BP: 130/71  Pulse: 83  Temp: 98.5 F (36.9 C)  Resp: 20    Patient/guardian has voiced understanding and agreed to follow-up with the PCP or specialist.  I personally performed the services described in this documentation, which was scribed in my presence. The recorded information has been reviewed and is accurate.   Linus Mako, PA-C 04/01/14 1250

## 2014-03-29 NOTE — ED Notes (Signed)
Pt fell about 1 month ago hurting left elbow, pt fell again today knee gave out on him and he hit that left elbow again. Pt now c/o pain.

## 2014-03-29 NOTE — Discharge Instructions (Signed)
Elbow Contusion °An elbow contusion is a deep bruise of the elbow. Contusions are the result of an injury that caused bleeding under the skin. The contusion may turn blue, purple, or yellow. Minor injuries will give you a painless contusion, but more severe contusions may stay painful and swollen for a few weeks.  °CAUSES  °An elbow contusion comes from a direct force to that area, such as falling on the elbow. °SYMPTOMS  °· Swelling and redness of the elbow. °· Bruising of the elbow area. °· Tenderness or soreness of the elbow. °DIAGNOSIS  °You will have a physical exam and will be asked about your history. You may need an X-ray of your elbow to look for a broken bone (fracture).  °TREATMENT  °A sling or splint may be needed to support your injury. Resting, elevating, and applying cold compresses to the elbow area are often the best treatments for an elbow contusion. Over-the-counter medicines may also be recommended for pain control. °HOME CARE INSTRUCTIONS  °· Put ice on the injured area. °· Put ice in a plastic bag. °· Place a towel between your skin and the bag. °· Leave the ice on for 15-20 minutes, 03-04 times a day. °· Only take over-the-counter or prescription medicines for pain, discomfort, or fever as directed by your caregiver. °· Rest your injured elbow until the pain and swelling are better. °· Elevate your elbow to reduce swelling. °· Apply a compression wrap as directed by your caregiver. This can help reduce swelling and motion. You may remove the wrap for sleeping, showers, and baths. If your fingers become numb, cold, or blue, take the wrap off and reapply it more loosely. °· Use your elbow only as directed by your caregiver. You may be asked to do range of motion exercises. Do them as directed. °· See your caregiver as directed. It is very important to keep all follow-up appointments in order to avoid any long-term problems with your elbow, including chronic pain or inability to move your elbow  normally. °SEEK IMMEDIATE MEDICAL CARE IF:  °· You have increased redness, swelling, or pain in your elbow. °· Your swelling or pain is not relieved with medicines. °· You have swelling of the hand and fingers. °· You are unable to move your fingers or wrist. °· You begin to lose feeling in your hand or fingers. °· Your fingers or hand become cold or blue. °MAKE SURE YOU:  °· Understand these instructions. °· Will watch your condition. °· Will get help right away if you are not doing well or get worse. °Document Released: 09/17/2006 Document Revised: 01/01/2012 Document Reviewed: 08/25/2011 °[employer]® Patient Information ©2014 [employer]. ° °

## 2014-04-02 NOTE — ED Provider Notes (Signed)
Medical screening examination/treatment/procedure(s) were performed by non-physician practitioner and as supervising physician I was immediately available for consultation/collaboration.   EKG Interpretation None       Leota Jacobsen, MD 04/02/14 1720

## 2014-05-05 ENCOUNTER — Ambulatory Visit: Payer: Self-pay | Admitting: General Practice

## 2014-05-11 ENCOUNTER — Ambulatory Visit: Payer: Self-pay | Admitting: General Practice

## 2014-05-27 ENCOUNTER — Other Ambulatory Visit: Payer: Self-pay | Admitting: General Practice

## 2014-05-27 LAB — CREATININE, SERUM
Creatinine: 1.07 mg/dL (ref 0.60–1.30)
EGFR (African American): >60
EGFR (Non-African Amer.): >60

## 2014-05-27 LAB — SYNOVIAL CELL COUNT + DIFF, W/ CRYSTALS
BASOS ABS: 0 %
Basophil: 0 %
Crystals, Joint Fluid: NONE SEEN
Crystals, Joint Fluid: NONE SEEN
Eosinophil: 0 %
Eosinophil: 0 %
LYMPHS PCT: 0 %
Lymphocytes: 0 %
NEUTROS PCT: 100 %
NUCLEATED CELL COUNT: 26011 /mm3
Neutrophils: 100 %
Nucleated Cell Count: 28215 /mm3
OTHER MONONUCLEAR CELLS: 0 %
Other Cells BF: 0 %
Other Cells BF: 0 %
Other Mononuclear Cells: 0 %

## 2014-05-27 LAB — BODY FLUID CELL COUNT WITH DIFFERENTIAL
Basophil: 0 %
Eosinophil: 0 %
LYMPHS PCT: 5 %
Neutrophils: 93 %
Nucleated Cell Count: 111283 /mm3
OTHER CELLS BF: 0 %
Other Mononuclear Cells: 2 %

## 2014-05-28 ENCOUNTER — Inpatient Hospital Stay: Payer: Self-pay | Admitting: General Practice

## 2014-05-28 LAB — CBC WITH DIFFERENTIAL/PLATELET
BASOS PCT: 0.6 %
Basophil #: 0 10*3/uL (ref 0.0–0.1)
EOS PCT: 0.9 %
Eosinophil #: 0.1 10*3/uL (ref 0.0–0.7)
HCT: 33.8 % — ABNORMAL LOW (ref 40.0–52.0)
HGB: 11.6 g/dL — AB (ref 13.0–18.0)
LYMPHS PCT: 23.5 %
Lymphocyte #: 1.7 10*3/uL (ref 1.0–3.6)
MCH: 32.3 pg (ref 26.0–34.0)
MCHC: 34.4 g/dL (ref 32.0–36.0)
MCV: 94 fL (ref 80–100)
Monocyte #: 0.8 x10 3/mm (ref 0.2–1.0)
Monocyte %: 10.5 %
Neutrophil #: 4.7 10*3/uL (ref 1.4–6.5)
Neutrophil %: 64.5 %
PLATELETS: 254 10*3/uL (ref 150–440)
RBC: 3.6 10*6/uL — ABNORMAL LOW (ref 4.40–5.90)
RDW: 14.4 % (ref 11.5–14.5)
WBC: 7.3 10*3/uL (ref 3.8–10.6)

## 2014-05-31 LAB — WOUND CULTURE

## 2014-05-31 LAB — BODY FLUID CULTURE

## 2014-06-01 LAB — WOUND CULTURE

## 2014-06-13 ENCOUNTER — Emergency Department (HOSPITAL_COMMUNITY)
Admission: EM | Admit: 2014-06-13 | Discharge: 2014-06-13 | Disposition: A | Discharge status: Home / Self Care | Payer: Medicare Other | Attending: Emergency Medicine | Admitting: Emergency Medicine

## 2014-06-13 ENCOUNTER — Encounter (HOSPITAL_COMMUNITY): Payer: Self-pay | Admitting: Emergency Medicine

## 2014-06-13 DIAGNOSIS — F3289 Other specified depressive episodes: Secondary | ICD-10-CM | POA: Insufficient documentation

## 2014-06-13 DIAGNOSIS — Z9889 Other specified postprocedural states: Secondary | ICD-10-CM | POA: Insufficient documentation

## 2014-06-13 DIAGNOSIS — G8918 Other acute postprocedural pain: Secondary | ICD-10-CM | POA: Diagnosis not present

## 2014-06-13 DIAGNOSIS — IMO0002 Reserved for concepts with insufficient information to code with codable children: Secondary | ICD-10-CM | POA: Insufficient documentation

## 2014-06-13 DIAGNOSIS — M25561 Pain in right knee: Secondary | ICD-10-CM

## 2014-06-13 DIAGNOSIS — Z860100 Personal history of colon polyps, unspecified: Secondary | ICD-10-CM | POA: Insufficient documentation

## 2014-06-13 DIAGNOSIS — Z8719 Personal history of other diseases of the digestive system: Secondary | ICD-10-CM | POA: Insufficient documentation

## 2014-06-13 DIAGNOSIS — E785 Hyperlipidemia, unspecified: Secondary | ICD-10-CM | POA: Insufficient documentation

## 2014-06-13 DIAGNOSIS — J449 Chronic obstructive pulmonary disease, unspecified: Secondary | ICD-10-CM | POA: Diagnosis not present

## 2014-06-13 DIAGNOSIS — M25569 Pain in unspecified knee: Secondary | ICD-10-CM | POA: Diagnosis not present

## 2014-06-13 DIAGNOSIS — F329 Major depressive disorder, single episode, unspecified: Secondary | ICD-10-CM | POA: Insufficient documentation

## 2014-06-13 DIAGNOSIS — E119 Type 2 diabetes mellitus without complications: Secondary | ICD-10-CM | POA: Insufficient documentation

## 2014-06-13 DIAGNOSIS — Z794 Long term (current) use of insulin: Secondary | ICD-10-CM | POA: Insufficient documentation

## 2014-06-13 DIAGNOSIS — M1710 Unilateral primary osteoarthritis, unspecified knee: Secondary | ICD-10-CM | POA: Diagnosis not present

## 2014-06-13 DIAGNOSIS — Z87448 Personal history of other diseases of urinary system: Secondary | ICD-10-CM | POA: Diagnosis not present

## 2014-06-13 DIAGNOSIS — Z79899 Other long term (current) drug therapy: Secondary | ICD-10-CM | POA: Diagnosis not present

## 2014-06-13 DIAGNOSIS — F17200 Nicotine dependence, unspecified, uncomplicated: Secondary | ICD-10-CM | POA: Insufficient documentation

## 2014-06-13 DIAGNOSIS — Z7982 Long term (current) use of aspirin: Secondary | ICD-10-CM | POA: Diagnosis not present

## 2014-06-13 DIAGNOSIS — J4489 Other specified chronic obstructive pulmonary disease: Secondary | ICD-10-CM | POA: Insufficient documentation

## 2014-06-13 MED ORDER — OXYCODONE-ACETAMINOPHEN 5-325 MG PO TABS: 1.0000 | ORAL_TABLET | Freq: Four times a day (QID) | ORAL | Status: DC | PRN

## 2014-06-13 NOTE — Discharge Instructions (Signed)
Please follow up closely with your orthopedist for further management of your R knee pain.    Knee Pain The knee is the complex joint between your thigh and your lower leg. It is made up of bones, tendons, ligaments, and cartilage. The bones that make up the knee are:  The femur in the thigh.  The tibia and fibula in the lower leg.  The patella or kneecap riding in the groove on the lower femur. CAUSES  Knee pain is a common complaint with many causes. A few of these causes are:  Injury, such as:  A ruptured ligament or tendon injury.  Torn cartilage.  Medical conditions, such as:  Gout  Arthritis  Infections  Overuse, over training, or overdoing a physical activity. Knee pain can be minor or severe. Knee pain can accompany debilitating injury. Minor knee problems often respond well to self-care measures or get well on their own. More serious injuries may need medical intervention or even surgery. SYMPTOMS The knee is complex. Symptoms of knee problems can vary widely. Some of the problems are:  Pain with movement and weight bearing.  Swelling and tenderness.  Buckling of the knee.  Inability to straighten or extend your knee.  Your knee locks and you cannot straighten it.  Warmth and redness with pain and fever.  Deformity or dislocation of the kneecap. DIAGNOSIS  Determining what is wrong may be very straight forward such as when there is an injury. It can also be challenging because of the complexity of the knee. Tests to make a diagnosis may include:  Your caregiver taking a history and doing a physical exam.  Routine X-rays can be used to rule out other problems. X-rays will not reveal a cartilage tear. Some injuries of the knee can be diagnosed by:  Arthroscopy a surgical technique by which a small video camera is inserted through tiny incisions on the sides of the knee. This procedure is used to examine and repair internal knee joint problems. Tiny  instruments can be used during arthroscopy to repair the torn knee cartilage (meniscus).  Arthrography is a radiology technique. A contrast liquid is directly injected into the knee joint. Internal structures of the knee joint then become visible on X-ray film.  An MRI scan is a non X-ray radiology procedure in which magnetic fields and a computer produce two- or three-dimensional images of the inside of the knee. Cartilage tears are often visible using an MRI scanner. MRI scans have largely replaced arthrography in diagnosing cartilage tears of the knee.  Blood work.  Examination of the fluid that helps to lubricate the knee joint (synovial fluid). This is done by taking a sample out using a needle and a syringe. TREATMENT The treatment of knee problems depends on the cause. Some of these treatments are:  Depending on the injury, proper casting, splinting, surgery, or physical therapy care will be needed.  Give yourself adequate recovery time. Do not overuse your joints. If you begin to get sore during workout routines, back off. Slow down or do fewer repetitions.  For repetitive activities such as cycling or running, maintain your strength and nutrition.  Alternate muscle groups. For example, if you are a weight lifter, work the upper body on one day and the lower body the next.  Either tight or weak muscles do not give the proper support for your knee. Tight or weak muscles do not absorb the stress placed on the knee joint. Keep the muscles surrounding the knee strong.  Take care of mechanical problems.  If you have flat feet, orthotics or special shoes may help. See your caregiver if you need help.  Arch supports, sometimes with wedges on the inner or outer aspect of the heel, can help. These can shift pressure away from the side of the knee most bothered by osteoarthritis.  A brace called an "unloader" brace also may be used to help ease the pressure on the most arthritic side of the  knee.  If your caregiver has prescribed crutches, braces, wraps or ice, use as directed. The acronym for this is PRICE. This means protection, rest, ice, compression, and elevation.  Nonsteroidal anti-inflammatory drugs (NSAIDs), can help relieve pain. But if taken immediately after an injury, they may actually increase swelling. Take NSAIDs with food in your stomach. Stop them if you develop stomach problems. Do not take these if you have a history of ulcers, stomach pain, or bleeding from the bowel. Do not take without your caregiver's approval if you have problems with fluid retention, heart failure, or kidney problems.  For ongoing knee problems, physical therapy may be helpful.  Glucosamine and chondroitin are over-the-counter dietary supplements. Both may help relieve the pain of osteoarthritis in the knee. These medicines are different from the usual anti-inflammatory drugs. Glucosamine may decrease the rate of cartilage destruction.  Injections of a corticosteroid drug into your knee joint may help reduce the symptoms of an arthritis flare-up. They may provide pain relief that lasts a few months. You may have to wait a few months between injections. The injections do have a small increased risk of infection, water retention, and elevated blood sugar levels.  Hyaluronic acid injected into damaged joints may ease pain and provide lubrication. These injections may work by reducing inflammation. A series of shots may give relief for as long as 6 months.  Topical painkillers. Applying certain ointments to your skin may help relieve the pain and stiffness of osteoarthritis. Ask your pharmacist for suggestions. Many over the-counter products are approved for temporary relief of arthritis pain.  In some countries, doctors often prescribe topical NSAIDs for relief of chronic conditions such as arthritis and tendinitis. A review of treatment with NSAID creams found that they worked as well as oral  medications but without the serious side effects. PREVENTION  Maintain a healthy weight. Extra pounds put more strain on your joints.  Get strong, stay limber. Weak muscles are a common cause of knee injuries. Stretching is important. Include flexibility exercises in your workouts.  Be smart about exercise. If you have osteoarthritis, chronic knee pain or recurring injuries, you may need to change the way you exercise. This does not mean you have to stop being active. If your knees ache after jogging or playing basketball, consider switching to swimming, water aerobics, or other low-impact activities, at least for a few days a week. Sometimes limiting high-impact activities will provide relief.  Make sure your shoes fit well. Choose footwear that is right for your sport.  Protect your knees. Use the proper gear for knee-sensitive activities. Use kneepads when playing volleyball or laying carpet. Buckle your seat belt every time you drive. Most shattered kneecaps occur in car accidents.  Rest when you are tired. SEEK MEDICAL CARE IF:  You have knee pain that is continual and does not seem to be getting better.  SEEK IMMEDIATE MEDICAL CARE IF:  Your knee joint feels hot to the touch and you have a high fever. MAKE SURE YOU:   Understand  these instructions.  Will watch your condition.  Will get help right away if you are not doing well or get worse. Document Released: 08/06/2007 Document Revised: 01/01/2012 Document Reviewed: 08/06/2007 [facility] Patient Information 2015 [place], [place]. This information is not intended to replace advice given to you by your health care provider. Make sure you discuss any questions you have with your health care provider.

## 2014-06-13 NOTE — ED Notes (Addendum)
Pt had surgery on 7/20 on right knee.  Had to have wound flushed on 8/5.  Pt is on home antibiotics.  Here today for knee pain.  Pain meds not working.  Does have meds at home.  Per pt, wound is not infected.

## 2014-06-13 NOTE — ED Provider Notes (Signed)
CSN: [id number]     Arrival date & time 06/13/14  1734 History  This chart was scribed for non-physician practitioner working with Babette Relic, MD by Mercy Moore, ED Scribe. This patient was seen in room WTR6/WTR6 and the patient's care was started at 6:56 PM.   Chief Complaint  Patient presents with  . Knee Pain  . Post-op Problem    The history is provided by the patient. No language interpreter was used.   HPI Comments: Chase Gibson is a 58 y.o. male who presents to the Emergency Department complaining of right knee post operative pain. Patient shares history of right total knee arthroplasty 11 years ago. He shares recent right knee arthroscopy on 05/11/14. He reports one week post operative swelling. His infected knee was then aspirated. On 05/27/14 he reports another arthroscope and flush and insertion of picc line and treatment with Vancomycin. Patient reports attempted treatment with Percocet or Hydrocodone, his home medications but denies relief of his current knee pain. Patient has recently exhausted his effective pain medication, Oxycodone and is unable to visit his doctor and update the prescription because it is the weekend. Patient states the pain is unbearable, describing throbbing pain and increased stabbing pain with prolonged standing.  Patient denies new numbness or chest pain . Past Medical History  Diagnosis Date  . ALLERGIC RHINITIS 03/18/2009  . ANXIETY 06/24/2007  . ASTHMA 06/24/2007  . BREAST PAIN, LEFT 07/21/2008  . DEPRESSION 06/24/2007  . DIABETES MELLITUS, TYPE II 07/21/2008  . GERD 08/25/2008  . HYPERLIPIDEMIA 07/21/2008  . INSOMNIA-SLEEP DISORDER-UNSPEC 03/28/2010  . LOW BACK PAIN 09/03/2007  . NAUSEA 06/14/2010  . OSTEOARTHRITIS, KNEES, BILATERAL 08/25/2008  . PERIPHERAL EDEMA 07/21/2008  . SYMPTOM, ABNORMAL INVOLUNTARY MOVEMENT NEC 06/21/2007  . Wheezing 07/13/2009  . COPD (chronic obstructive pulmonary disease) 03/28/2012  . Colon polyps 03/28/2012   Past Surgical  History  Procedure Laterality Date  . Right knee surgury      x 9  . Rotator cuff repair      bilateral  . Inguinal herniorrhapy    . Appendectomy    . Cholecystectomy    . Tonsillectomy    . Total knee arthroplasty      right  . Knee arthroscopy      left   Family History  Problem Relation Age of Onset  . Coronary artery disease Other   . Emphysema Mother     never smoker but spouse smoked  . Allergies Son   . Asthma Maternal Grandmother   . Rheum arthritis Sister    History  Substance Use Topics  . Smoking status: Current Every Day Smoker -- 0.50 packs/day for 35 years    Types: Cigarettes    Last Attempt to Quit: 12/02/2012  . Smokeless tobacco: Never Used  . Alcohol Use: No    Review of Systems  Constitutional: Negative for fever and chills.  Cardiovascular: Negative for chest pain.  Musculoskeletal: Positive for arthralgias.  Neurological: Negative for weakness and numbness.    Allergies  Nsaids  Home Medications   Prior to Admission medications   Medication Sig Start Date End Date Taking? Authorizing Provider  albuterol (VENTOLIN HFA) 108 (90 BASE) MCG/ACT inhaler Inhale 2 puffs into the lungs every 6 (six) hours as needed for shortness of breath.     Historical Provider, MD  aspirin EC 325 MG tablet Take 325 mg by mouth daily.    Historical Provider, MD  budesonide-formoterol (SYMBICORT) 160-4.5 MCG/ACT inhaler Take 2  puffs first thing in am and then another 2 puffs about 12 hours later. 03/25/13   Tanda Rockers, MD  divalproex (DEPAKOTE ER) 500 MG 24 hr tablet Take 500 mg by mouth daily.    Historical Provider, MD  HYDROcodone-acetaminophen (NORCO) 10-325 MG per tablet Take 1 tablet by mouth every 6 (six) hours as needed for moderate pain. 03/28/12   Biagio Borg, MD  insulin glargine (LANTUS) 100 UNIT/ML injection Inject 26 Units into the skin daily.    Historical Provider, MD  ipratropium (ATROVENT HFA) 17 MCG/ACT inhaler Inhale 2 puffs into the lungs every  6 (six) hours as needed for wheezing.     Historical Provider, MD  ipratropium-albuterol (DUONEB) 0.5-2.5 (3) MG/3ML SOLN Take 3 mLs by nebulization every 6 (six) hours as needed (shortness of breath).    Historical Provider, MD  montelukast (SINGULAIR) 10 MG tablet Take 10 mg by mouth every morning. 09/28/11   Biagio Borg, MD  oxyCODONE-acetaminophen (PERCOCET/ROXICET) 5-325 MG per tablet Take 1 tablet by mouth every 6 (six) hours as needed for severe pain. 03/29/14   Tiffany Marilu Favre, PA-C  QUEtiapine (SEROQUEL) 300 MG tablet Take 300 mg by mouth at bedtime.    Historical Provider, MD  simvastatin (ZOCOR) 20 MG tablet Take 10 mg by mouth every evening.    Historical Provider, MD  sitaGLIPtan-metformin (JANUMET) 50-1000 MG per tablet Take 1 tablet by mouth 2 (two) times daily. 09/28/11   Biagio Borg, MD  tiotropium (SPIRIVA) 18 MCG inhalation capsule Place 18 mcg into inhaler and inhale daily.      Historical Provider, MD   Triage Vitals: BP 110/64  Pulse 83  Temp(Src) 98.4 F (36.9 C) (Oral)  Resp 16  SpO2 98%  Physical Exam  Nursing note and vitals reviewed. Constitutional: He is oriented to person, place, and time. He appears well-developed and well-nourished.  HENT:  Head: Normocephalic and atraumatic.  Eyes: EOM are normal.  Neck: Neck supple.  Cardiovascular: Normal rate.   Pulmonary/Chest: Effort normal and breath sounds normal.  Musculoskeletal: He exhibits tenderness.  Picc line to left upper forearm that appears to be working with no signs of infection.  Right knee: well healing anterior surgical wound with surrounding erythema. Mildly tender to palpation. New effusion present. otherwise sensation intact. Normal ROM.    Neurological: He is alert and oriented to person, place, and time.  Skin: Skin is warm and dry.  Psychiatric: He has a normal mood and affect. His behavior is normal.    ED Course  Procedures (including critical care time)  COORDINATION OF CARE: 7:04 PM-  pt here for pain management.  No evidence of infection.  Pt is to f/u with orthopedist early next week.  Will prescribe pain meciation. Discussed treatment plan with patient at bedside and patient agreed to plan.   Labs Review Labs Reviewed - No data to display  Imaging Review No results found.   EKG Interpretation None      MDM   Final diagnoses:  Right knee pain    BP 110/64  Pulse 83  Temp(Src) 98.4 F (36.9 C) (Oral)  Resp 16  SpO2 98%   I personally performed the services described in this documentation, which was scribed in my presence. The recorded information has been reviewed and is accurate.    Domenic Moras, PA-C 06/13/14 2012

## 2014-06-23 NOTE — ED Provider Notes (Signed)
Medical screening examination/treatment/procedure(s) were performed by non-physician practitioner and as supervising physician I was immediately available for consultation/collaboration.   EKG Interpretation None       Babette Relic, MD 06/23/14 2009

## 2014-07-26 ENCOUNTER — Emergency Department (HOSPITAL_COMMUNITY)
Admission: EM | Admit: 2014-07-26 | Discharge: 2014-07-26 | Disposition: A | Discharge status: Home / Self Care | Payer: Medicare Other | Attending: Emergency Medicine | Admitting: Emergency Medicine

## 2014-07-26 ENCOUNTER — Encounter (HOSPITAL_COMMUNITY): Payer: Self-pay | Admitting: Emergency Medicine

## 2014-07-26 DIAGNOSIS — G8929 Other chronic pain: Secondary | ICD-10-CM

## 2014-07-26 DIAGNOSIS — E119 Type 2 diabetes mellitus without complications: Secondary | ICD-10-CM | POA: Insufficient documentation

## 2014-07-26 DIAGNOSIS — Z720 Tobacco use: Secondary | ICD-10-CM | POA: Insufficient documentation

## 2014-07-26 DIAGNOSIS — Z96651 Presence of right artificial knee joint: Secondary | ICD-10-CM | POA: Diagnosis not present

## 2014-07-26 DIAGNOSIS — F329 Major depressive disorder, single episode, unspecified: Secondary | ICD-10-CM | POA: Insufficient documentation

## 2014-07-26 DIAGNOSIS — F419 Anxiety disorder, unspecified: Secondary | ICD-10-CM | POA: Diagnosis not present

## 2014-07-26 DIAGNOSIS — Z794 Long term (current) use of insulin: Secondary | ICD-10-CM | POA: Diagnosis not present

## 2014-07-26 DIAGNOSIS — Z8719 Personal history of other diseases of the digestive system: Secondary | ICD-10-CM | POA: Insufficient documentation

## 2014-07-26 DIAGNOSIS — Z7982 Long term (current) use of aspirin: Secondary | ICD-10-CM | POA: Insufficient documentation

## 2014-07-26 DIAGNOSIS — J449 Chronic obstructive pulmonary disease, unspecified: Secondary | ICD-10-CM | POA: Insufficient documentation

## 2014-07-26 DIAGNOSIS — Z86010 Personal history of colonic polyps: Secondary | ICD-10-CM | POA: Insufficient documentation

## 2014-07-26 DIAGNOSIS — E785 Hyperlipidemia, unspecified: Secondary | ICD-10-CM | POA: Insufficient documentation

## 2014-07-26 DIAGNOSIS — Z79899 Other long term (current) drug therapy: Secondary | ICD-10-CM | POA: Insufficient documentation

## 2014-07-26 DIAGNOSIS — M170 Bilateral primary osteoarthritis of knee: Secondary | ICD-10-CM | POA: Diagnosis not present

## 2014-07-26 DIAGNOSIS — M25561 Pain in right knee: Secondary | ICD-10-CM | POA: Diagnosis present

## 2014-07-26 NOTE — ED Provider Notes (Signed)
CSN: [id number]     Arrival date & time 07/26/14  1145 History  This chart was scribed for Alecia Lemming Pa-C working with Ephraim Hamburger, MD by Marti Sleigh, ED Scribe. This patient was seen in room WTR7/WTR7 and the patient's care was started at 12:00 PM.    Chief Complaint  Patient presents with  . Knee Pain   The history is provided by the patient. No language interpreter was used.   HPI Comments: Chase Gibson is a 58 y.o. male with a past hx of knee surgery who presents to the Emergency Department complaining of right knee pain. Pt is seeking a pain medication refill. Pt's last surgery was in July 4th, 2015. Pt states the surgeon went back in to "clean out" the knee again on August 5th, 2015, and again 2.5 weeks later. Pt claims swelling and cramping pain as associated symptoms. Pt states the knee is not infected. Pt states that last time the Pt was in the ED he was prescribed oxycodone. Pt has an cold water circulating machine to manage pain at home, as well as oxycodone, tylenol and aleve. Pt states he ran out of hydrocodone two days ago -- but then states that he has hydrocodone 10-325 but states that these are too strong. Pt states he is allergic to antiinflammatory medications.  Patient look-up performed on the [facility] substance reporting database. Patient has received 1275 tablets of narcotic pain medications over the past 4 months.   Pt's PCP is Dr. Moreen Fowler.   Past Medical History  Diagnosis Date  . ALLERGIC RHINITIS 03/18/2009  . ANXIETY 06/24/2007  . ASTHMA 06/24/2007  . BREAST PAIN, LEFT 07/21/2008  . DEPRESSION 06/24/2007  . DIABETES MELLITUS, TYPE II 07/21/2008  . GERD 08/25/2008  . HYPERLIPIDEMIA 07/21/2008  . INSOMNIA-SLEEP DISORDER-UNSPEC 03/28/2010  . LOW BACK PAIN 09/03/2007  . NAUSEA 06/14/2010  . OSTEOARTHRITIS, KNEES, BILATERAL 08/25/2008  . PERIPHERAL EDEMA 07/21/2008  . SYMPTOM, ABNORMAL INVOLUNTARY MOVEMENT NEC 06/21/2007  . Wheezing 07/13/2009  . COPD (chronic  obstructive pulmonary disease) 03/28/2012  . Colon polyps 03/28/2012   Past Surgical History  Procedure Laterality Date  . Right knee surgury      x 9  . Rotator cuff repair      bilateral  . Inguinal herniorrhapy    . Appendectomy    . Cholecystectomy    . Tonsillectomy    . Total knee arthroplasty      right  . Knee arthroscopy      left   Family History  Problem Relation Age of Onset  . Coronary artery disease Other   . Emphysema Mother     never smoker but spouse smoked  . Allergies Son   . Asthma Maternal Grandmother   . Rheum arthritis Sister    History  Substance Use Topics  . Smoking status: Current Every Day Smoker -- 0.50 packs/day for 35 years    Types: Cigarettes    Last Attempt to Quit: 12/02/2012  . Smokeless tobacco: Never Used  . Alcohol Use: No    Review of Systems  Constitutional: Negative for activity change.  Musculoskeletal: Positive for arthralgias, gait problem and joint swelling. Negative for back pain and neck pain.  Skin: Negative for wound.  Neurological: Negative for weakness and numbness.    Allergies  Nsaids  Home Medications   Prior to Admission medications   Medication Sig Start Date End Date Taking? Authorizing Provider  albuterol (VENTOLIN HFA) 108 (90 BASE) MCG/ACT inhaler  Inhale 2 puffs into the lungs every 6 (six) hours as needed for shortness of breath.     Historical Provider, MD  aspirin EC 325 MG tablet Take 325 mg by mouth daily.    Historical Provider, MD  budesonide-formoterol (SYMBICORT) 160-4.5 MCG/ACT inhaler Take 2 puffs first thing in am and then another 2 puffs about 12 hours later. 03/25/13   Tanda Rockers, MD  divalproex (DEPAKOTE ER) 500 MG 24 hr tablet Take 500 mg by mouth daily.    Historical Provider, MD  HYDROcodone-acetaminophen (NORCO) 10-325 MG per tablet Take 1 tablet by mouth every 6 (six) hours as needed for moderate pain. 03/28/12   Biagio Borg, MD  insulin glargine (LANTUS) 100 UNIT/ML injection Inject  26 Units into the skin daily.    Historical Provider, MD  ipratropium (ATROVENT HFA) 17 MCG/ACT inhaler Inhale 2 puffs into the lungs every 6 (six) hours as needed for wheezing.     Historical Provider, MD  ipratropium-albuterol (DUONEB) 0.5-2.5 (3) MG/3ML SOLN Take 3 mLs by nebulization every 6 (six) hours as needed (shortness of breath).    Historical Provider, MD  montelukast (SINGULAIR) 10 MG tablet Take 10 mg by mouth every morning. 09/28/11   Biagio Borg, MD  oxyCODONE-acetaminophen (PERCOCET/ROXICET) 5-325 MG per tablet Take 1 tablet by mouth every 6 (six) hours as needed for severe pain. 06/13/14   Domenic Moras, PA-C  QUEtiapine (SEROQUEL) 300 MG tablet Take 300 mg by mouth at bedtime.    Historical Provider, MD  simvastatin (ZOCOR) 20 MG tablet Take 10 mg by mouth every evening.    Historical Provider, MD  sitaGLIPtan-metformin (JANUMET) 50-1000 MG per tablet Take 1 tablet by mouth 2 (two) times daily. 09/28/11   Biagio Borg, MD  tiotropium (SPIRIVA) 18 MCG inhalation capsule Place 18 mcg into inhaler and inhale daily.      Historical Provider, MD   BP 123/76  Pulse 100  Temp(Src) 98.5 F (36.9 C) (Oral)  SpO2 100% Physical Exam  Nursing note and vitals reviewed. Constitutional: He is oriented to person, place, and time. He appears well-developed and well-nourished.  HENT:  Head: Normocephalic and atraumatic.  Eyes: Conjunctivae and EOM are normal. Pupils are equal, round, and reactive to light.  Neck: Normal range of motion. Neck supple.  Cardiovascular: Normal rate, regular rhythm and normal heart sounds.   Pulses:      Dorsalis pedis pulses are 2+ on the right side, and 2+ on the left side.       Posterior tibial pulses are 2+ on the right side, and 2+ on the left side.  Pulmonary/Chest: Effort normal and breath sounds normal.  Abdominal: Soft. Bowel sounds are normal.  Musculoskeletal: He exhibits edema and tenderness.       Right hip: Normal.       Right knee: He exhibits  swelling and effusion. He exhibits normal range of motion. Tenderness found.       Right ankle: Normal.  Neurological: He is alert and oriented to person, place, and time.  Skin: Skin is warm and dry.  Psychiatric: He has a normal mood and affect. His behavior is normal.    ED Course  Procedures  DIAGNOSTIC STUDIES: Oxygen Saturation is 100% on RA, normal by my interpretation.    COORDINATION OF CARE: 12:08 PM Discussed treatment plan with pt at bedside and pt agreed to plan.  Labs Review Labs Reviewed - No data to display  Imaging Review No results found.  EKG Interpretation None      Vital signs reviewed and are as follows: Filed Vitals:   07/26/14 1150  BP: 123/76  Pulse: 100  Temp: 98.5 F (36.9 C)   Patient not forthcoming and his story changes when asked about the chronic pain medications that he is prescribed.  I told the patient that he can cut the hydrocodone 10 -325 in half and take this if too strong for him. I told him that I would not provide him with additional narcotic pain medications today. He should followup with his primary care physician.  Patient was counseled on RICE protocol and told to rest injury, use ice for no longer than 15 minutes every hour, compress the area, and elevate above the level of their heart as much as possible to reduce swelling. Questions answered. Patient verbalized understanding.    Encouraged followup with PCP.  MDM   Final diagnoses:  Chronic knee pain, right   Patients with chronic knee problems, previous infection after surgery. Patient does have an effusion however I suspect this is his baseline. There is no signs of septic arthritis today. Patient has full range of motion of his joint at baseline. I am concerned for narcotic seeking behavior with this patient. I am concerned with diversion given the very large quantities of narcotics is received that for the past several months. No acute orthopedic emergency suspected  at this time.  I personally performed the services described in this documentation, which was scribed in my presence. The recorded information has been reviewed and is accurate.    Carlisle Cater, PA-C 07/26/14 1312

## 2014-07-26 NOTE — ED Provider Notes (Signed)
Medical screening examination/treatment/procedure(s) were performed by non-physician practitioner and as supervising physician I was immediately available for consultation/collaboration.   EKG Interpretation None        Ephraim Hamburger, MD 07/26/14 [phone]

## 2014-07-26 NOTE — ED Notes (Signed)
Patient has been having knee pain for several months with surgery in July. He is now having uncontrolled pain.

## 2014-07-26 NOTE — ED Notes (Signed)
Patient walked out after being seen by attending and declined to take discharge paperwork or discharge instructions verbally.

## 2014-09-11 ENCOUNTER — Ambulatory Visit: Payer: Self-pay | Admitting: General Practice

## 2014-09-11 LAB — BODY FLUID CELL COUNT WITH DIFFERENTIAL
Basophil: 0 %
EOS PCT: 0 %
Lymphocytes: 2 %
NUCLEATED CELL COUNT: 77252 /mm3
Neutrophils: 88 %
OTHER CELLS BF: 0 %
Other Mononuclear Cells: 10 %

## 2014-09-15 LAB — MISC AER/ANAEROBIC CULT.

## 2014-09-24 ENCOUNTER — Ambulatory Visit: Payer: Self-pay | Admitting: General Practice

## 2014-09-24 LAB — CBC
HCT: 37.3 % — AB (ref 40.0–52.0)
HGB: 12.6 g/dL — ABNORMAL LOW (ref 13.0–18.0)
MCH: 30.3 pg (ref 26.0–34.0)
MCHC: 33.9 g/dL (ref 32.0–36.0)
MCV: 90 fL (ref 80–100)
Platelet: 225 10*3/uL (ref 150–440)
RBC: 4.17 10*6/uL — AB (ref 4.40–5.90)
RDW: 17.1 % — ABNORMAL HIGH (ref 11.5–14.5)
WBC: 8.6 10*3/uL (ref 3.8–10.6)

## 2014-09-24 LAB — URINALYSIS, COMPLETE
Bacteria: NONE SEEN
Bilirubin,UR: NEGATIVE
Blood: NEGATIVE
Glucose,UR: NEGATIVE mg/dL (ref 0–75)
Ketone: NEGATIVE
Leukocyte Esterase: NEGATIVE
Nitrite: NEGATIVE
PH: 5 (ref 4.5–8.0)
Protein: 30
RBC,UR: 1 /HPF (ref 0–5)
SPECIFIC GRAVITY: 1.025 (ref 1.003–1.030)
Squamous Epithelial: NONE SEEN
WBC UR: 8 /HPF (ref 0–5)

## 2014-09-24 LAB — BASIC METABOLIC PANEL
Anion Gap: 7 (ref 7–16)
BUN: 13 mg/dL (ref 7–18)
CREATININE: 1.31 mg/dL — AB (ref 0.60–1.30)
Calcium, Total: 9.8 mg/dL (ref 8.5–10.1)
Chloride: 103 mmol/L (ref 98–107)
Co2: 27 mmol/L (ref 21–32)
EGFR (African American): >60
GFR CALC NON AF AMER: 60 — AB
Glucose: 88 mg/dL (ref 65–99)
OSMOLALITY: 273 (ref 275–301)
Potassium: 4.5 mmol/L (ref 3.5–5.1)
Sodium: 137 mmol/L (ref 136–145)

## 2014-09-24 LAB — PROTIME-INR
INR: 1
PROTHROMBIN TIME: 12.8 s (ref 11.5–14.7)

## 2014-09-24 LAB — SEDIMENTATION RATE: Erythrocyte Sed Rate: 86 mm/hr — ABNORMAL HIGH (ref 0–20)

## 2014-09-24 LAB — APTT: Activated PTT: 36 secs — ABNORMAL HIGH (ref 23.6–35.9)

## 2014-09-24 LAB — MRSA PCR SCREENING

## 2014-09-26 LAB — URINE CULTURE

## 2014-10-02 ENCOUNTER — Inpatient Hospital Stay: Payer: Self-pay | Admitting: General Practice

## 2014-10-02 HISTORY — PX: OTHER SURGICAL HISTORY: SHX169

## 2014-10-02 LAB — CREATININE, SERUM
CREATININE: 1.2 mg/dL (ref 0.60–1.30)
EGFR (African American): >60

## 2014-10-02 LAB — BODY FLUID CELL COUNT WITH DIFFERENTIAL
Basophil: 0 %
Eosinophil: 0 %
LYMPHS PCT: 3 %
NUCLEATED CELL COUNT: 19834 /mm3
Neutrophils: 92 %
Other Cells BF: 0 %
Other Mononuclear Cells: 5 %

## 2014-10-03 LAB — BASIC METABOLIC PANEL
Anion Gap: 3 — ABNORMAL LOW (ref 7–16)
BUN: 9 mg/dL (ref 7–18)
CHLORIDE: 108 mmol/L — AB (ref 98–107)
Calcium, Total: 8.1 mg/dL — ABNORMAL LOW (ref 8.5–10.1)
Co2: 26 mmol/L (ref 21–32)
Creatinine: 1.22 mg/dL (ref 0.60–1.30)
EGFR (Non-African Amer.): >60
GLUCOSE: 97 mg/dL (ref 65–99)
Osmolality: 272 (ref 275–301)
POTASSIUM: 4.2 mmol/L (ref 3.5–5.1)
SODIUM: 137 mmol/L (ref 136–145)

## 2014-10-03 LAB — PLATELET COUNT: Platelet: 162 10*3/uL (ref 150–440)

## 2014-10-03 LAB — HEMOGLOBIN: HGB: 8.8 g/dL — ABNORMAL LOW (ref 13.0–18.0)

## 2014-10-04 LAB — CBC WITH DIFFERENTIAL/PLATELET
Basophil #: 0 10*3/uL (ref 0.0–0.1)
Basophil %: 0.6 %
EOS ABS: 0.1 10*3/uL (ref 0.0–0.7)
Eosinophil %: 1.1 %
HCT: 22.3 % — AB (ref 40.0–52.0)
HGB: 7.4 g/dL — ABNORMAL LOW (ref 13.0–18.0)
LYMPHS ABS: 1.1 10*3/uL (ref 1.0–3.6)
Lymphocyte %: 16 %
MCH: 30.2 pg (ref 26.0–34.0)
MCHC: 33.1 g/dL (ref 32.0–36.0)
MCV: 91 fL (ref 80–100)
MONOS PCT: 8.4 %
Monocyte #: 0.6 x10 3/mm (ref 0.2–1.0)
Neutrophil #: 5 10*3/uL (ref 1.4–6.5)
Neutrophil %: 73.9 %
Platelet: 140 10*3/uL — ABNORMAL LOW (ref 150–440)
RBC: 2.45 10*6/uL — AB (ref 4.40–5.90)
RDW: 16.1 % — ABNORMAL HIGH (ref 11.5–14.5)
WBC: 6.7 10*3/uL (ref 3.8–10.6)

## 2014-10-04 LAB — BASIC METABOLIC PANEL
Anion Gap: 7 (ref 7–16)
BUN: 7 mg/dL (ref 7–18)
CHLORIDE: 106 mmol/L (ref 98–107)
Calcium, Total: 8.1 mg/dL — ABNORMAL LOW (ref 8.5–10.1)
Co2: 24 mmol/L (ref 21–32)
Creatinine: 1.11 mg/dL (ref 0.60–1.30)
EGFR (African American): >60
EGFR (Non-African Amer.): >60
Glucose: 100 mg/dL — ABNORMAL HIGH (ref 65–99)
OSMOLALITY: 272 (ref 275–301)
Potassium: 3.7 mmol/L (ref 3.5–5.1)
Sodium: 137 mmol/L (ref 136–145)

## 2014-10-05 LAB — VANCOMYCIN, TROUGH: Vancomycin, Trough: 16 ug/mL (ref 10–20)

## 2014-10-05 LAB — HEMOGLOBIN: HGB: 8.1 g/dL — ABNORMAL LOW (ref 13.0–18.0)

## 2014-10-06 LAB — WOUND CULTURE

## 2014-10-06 LAB — BODY FLUID CULTURE

## 2014-10-16 ENCOUNTER — Encounter (HOSPITAL_COMMUNITY): Payer: Self-pay | Admitting: Emergency Medicine

## 2014-10-16 ENCOUNTER — Emergency Department (HOSPITAL_COMMUNITY)
Admission: EM | Admit: 2014-10-16 | Discharge: 2014-10-16 | Disposition: A | Discharge status: Home / Self Care | Payer: Medicare Other | Attending: Emergency Medicine | Admitting: Emergency Medicine

## 2014-10-16 DIAGNOSIS — J449 Chronic obstructive pulmonary disease, unspecified: Secondary | ICD-10-CM | POA: Diagnosis not present

## 2014-10-16 DIAGNOSIS — N179 Acute kidney failure, unspecified: Secondary | ICD-10-CM | POA: Diagnosis not present

## 2014-10-16 DIAGNOSIS — G4700 Insomnia, unspecified: Secondary | ICD-10-CM | POA: Diagnosis not present

## 2014-10-16 DIAGNOSIS — Z720 Tobacco use: Secondary | ICD-10-CM | POA: Diagnosis not present

## 2014-10-16 DIAGNOSIS — E119 Type 2 diabetes mellitus without complications: Secondary | ICD-10-CM | POA: Insufficient documentation

## 2014-10-16 DIAGNOSIS — Z8719 Personal history of other diseases of the digestive system: Secondary | ICD-10-CM | POA: Diagnosis not present

## 2014-10-16 DIAGNOSIS — T8454XA Infection and inflammatory reaction due to internal left knee prosthesis, initial encounter: Secondary | ICD-10-CM | POA: Diagnosis not present

## 2014-10-16 DIAGNOSIS — M170 Bilateral primary osteoarthritis of knee: Secondary | ICD-10-CM | POA: Diagnosis not present

## 2014-10-16 DIAGNOSIS — R7989 Other specified abnormal findings of blood chemistry: Secondary | ICD-10-CM | POA: Diagnosis present

## 2014-10-16 DIAGNOSIS — T8459XA Infection and inflammatory reaction due to other internal joint prosthesis, initial encounter: Secondary | ICD-10-CM

## 2014-10-16 DIAGNOSIS — Y831 Surgical operation with implant of artificial internal device as the cause of abnormal reaction of the patient, or of later complication, without mention of misadventure at the time of the procedure: Secondary | ICD-10-CM | POA: Insufficient documentation

## 2014-10-16 DIAGNOSIS — Z86010 Personal history of colonic polyps: Secondary | ICD-10-CM | POA: Insufficient documentation

## 2014-10-16 DIAGNOSIS — Z794 Long term (current) use of insulin: Secondary | ICD-10-CM | POA: Insufficient documentation

## 2014-10-16 DIAGNOSIS — Z7982 Long term (current) use of aspirin: Secondary | ICD-10-CM | POA: Diagnosis not present

## 2014-10-16 DIAGNOSIS — Z79899 Other long term (current) drug therapy: Secondary | ICD-10-CM | POA: Diagnosis not present

## 2014-10-16 DIAGNOSIS — E785 Hyperlipidemia, unspecified: Secondary | ICD-10-CM | POA: Diagnosis not present

## 2014-10-16 DIAGNOSIS — Z96659 Presence of unspecified artificial knee joint: Secondary | ICD-10-CM

## 2014-10-16 DIAGNOSIS — F329 Major depressive disorder, single episode, unspecified: Secondary | ICD-10-CM | POA: Insufficient documentation

## 2014-10-16 LAB — CBC WITH DIFFERENTIAL/PLATELET
Basophils Absolute: 0 10*3/uL (ref 0.0–0.1)
Basophils Relative: 0 % (ref 0–1)
EOS PCT: 1 % (ref 0–5)
Eosinophils Absolute: 0.1 10*3/uL (ref 0.0–0.7)
HEMATOCRIT: 27.5 % — AB (ref 39.0–52.0)
Hemoglobin: 9 g/dL — ABNORMAL LOW (ref 13.0–17.0)
LYMPHS ABS: 1.9 10*3/uL (ref 0.7–4.0)
LYMPHS PCT: 19 % (ref 12–46)
MCH: 30.1 pg (ref 26.0–34.0)
MCHC: 32.7 g/dL (ref 30.0–36.0)
MCV: 92 fL (ref 78.0–100.0)
Monocytes Absolute: 0.7 10*3/uL (ref 0.1–1.0)
Monocytes Relative: 7 % (ref 3–12)
NEUTROS ABS: 7.5 10*3/uL (ref 1.7–7.7)
Neutrophils Relative %: 73 % (ref 43–77)
Platelets: 242 10*3/uL (ref 150–400)
RBC: 2.99 MIL/uL — AB (ref 4.22–5.81)
RDW: 16 % — ABNORMAL HIGH (ref 11.5–15.5)
WBC: 10.3 10*3/uL (ref 4.0–10.5)

## 2014-10-16 LAB — COMPREHENSIVE METABOLIC PANEL
ALBUMIN: 3.6 g/dL (ref 3.5–5.2)
ALT: 11 U/L (ref 0–53)
ANION GAP: 8 (ref 5–15)
AST: 17 U/L (ref 0–37)
Alkaline Phosphatase: 117 U/L (ref 39–117)
BUN: 26 mg/dL — ABNORMAL HIGH (ref 6–23)
CALCIUM: 9 mg/dL (ref 8.4–10.5)
CO2: 24 mmol/L (ref 19–32)
CREATININE: 2.45 mg/dL — AB (ref 0.50–1.35)
Chloride: 102 mEq/L (ref 96–112)
GFR calc Af Amer: 32 mL/min — ABNORMAL LOW (ref >90)
GFR calc non Af Amer: 28 mL/min — ABNORMAL LOW (ref >90)
Glucose, Bld: 93 mg/dL (ref 70–99)
Potassium: 4.9 mmol/L (ref 3.5–5.1)
Sodium: 134 mmol/L — ABNORMAL LOW (ref 135–145)
TOTAL PROTEIN: 7.5 g/dL (ref 6.0–8.3)
Total Bilirubin: 0.4 mg/dL (ref 0.3–1.2)

## 2014-10-16 LAB — I-STAT CHEM 8, ED
BUN: 23 mg/dL (ref 6–23)
CREATININE: 2.3 mg/dL — AB (ref 0.50–1.35)
Calcium, Ion: 1.16 mmol/L (ref 1.12–1.23)
Chloride: 102 mEq/L (ref 96–112)
GLUCOSE: 81 mg/dL (ref 70–99)
HEMATOCRIT: 27 % — AB (ref 39.0–52.0)
HEMOGLOBIN: 9.2 g/dL — AB (ref 13.0–17.0)
POTASSIUM: 4.8 mmol/L (ref 3.5–5.1)
Sodium: 136 mmol/L (ref 135–145)
TCO2: 20 mmol/L (ref 0–100)

## 2014-10-16 LAB — URINALYSIS, ROUTINE W REFLEX MICROSCOPIC
Bilirubin Urine: NEGATIVE
GLUCOSE, UA: NEGATIVE mg/dL
Hgb urine dipstick: NEGATIVE
KETONES UR: NEGATIVE mg/dL
Leukocytes, UA: NEGATIVE
NITRITE: NEGATIVE
PH: 6.5 (ref 5.0–8.0)
Protein, ur: 30 mg/dL — AB
SPECIFIC GRAVITY, URINE: 1.014 (ref 1.005–1.030)
Urobilinogen, UA: 0.2 mg/dL (ref 0.0–1.0)

## 2014-10-16 LAB — VANCOMYCIN, RANDOM: Vancomycin Rm: 13.7 ug/mL

## 2014-10-16 LAB — URINE MICROSCOPIC-ADD ON

## 2014-10-16 MED ORDER — HYDROCODONE-ACETAMINOPHEN 5-325 MG PO TABS
1.0000 | ORAL_TABLET | Freq: Once | ORAL | Status: AC
  Administered 2014-10-16: 1 via ORAL
  Filled 2014-10-16: qty 1

## 2014-10-16 MED ORDER — SODIUM CHLORIDE 0.9 % IV SOLN
6.0000 mg/kg | INTRAVENOUS | Status: DC
  Administered 2014-10-16: 464.5 mg via INTRAVENOUS
  Filled 2014-10-16: qty 9.29

## 2014-10-16 MED ORDER — SODIUM CHLORIDE 0.9 % IV BOLUS (SEPSIS)
1000.0000 mL | Freq: Once | INTRAVENOUS | Status: AC
  Administered 2014-10-16: 1000 mL via INTRAVENOUS

## 2014-10-16 MED ORDER — SODIUM CHLORIDE 0.9 % IV BOLUS (SEPSIS)
500.0000 mL | Freq: Once | INTRAVENOUS | Status: AC
  Administered 2014-10-16: 500 mL via INTRAVENOUS

## 2014-10-16 NOTE — ED Notes (Signed)
MD Lockwood at bedside.  

## 2014-10-16 NOTE — ED Notes (Signed)
Pt presents to ED per [facility]HR[facility]'s request due to elevated Creatinine level. Pt denies any chest pain, nausea, vomiting, or SOB

## 2014-10-16 NOTE — ED Notes (Signed)
MD at bedside. 

## 2014-10-16 NOTE — ED Notes (Addendum)
Was asked by provider to not draw chem 8 until pt gets more fluids.

## 2014-10-16 NOTE — ED Notes (Signed)
Per [facility] nurse, patient's creatine is elevated and needs to be evaluated-patient on antibiotic therapy for knee infection after replacement.

## 2014-10-16 NOTE — ED Notes (Addendum)
error 

## 2014-10-16 NOTE — Discharge Instructions (Signed)
Return to the emergency room with worsening of symptoms, new symptoms or with symptoms that are concerning. Follow all below instructions and information on acute kidney injury below. Call to make appointment with your internal medicine/infectious disease physician as soon as possible. Home health should call you tomorrow for your new medical supplies as well as medicine administration.  Acute Kidney Injury Acute kidney injury is a disease in which there is sudden (acute) damage to the kidneys. The kidneys are 2 organs that lie on either side of the spine between the middle of the back and the front of the abdomen. The kidneys:  Remove wastes and extra water from the blood.   Produce important hormones. These help keep bones strong, regulate blood pressure, and help create red blood cells.   Balance the fluids and chemicals in the blood and tissues. A small amount of kidney damage may not cause problems, but a large amount of damage may make it difficult or impossible for the kidneys to work the way they should. Acute kidney injury may develop into long-lasting (chronic) kidney disease. It may also develop into a life-threatening disease called end-stage kidney disease. Acute kidney injury can get worse very quickly, so it should be treated right away. Early treatment may prevent other kidney diseases from developing.  CAUSES   A problem with blood flow to the kidneys. This may be caused by:   Blood loss.   Heart disease.   Severe burns.   Liver disease.  Direct damage to the kidneys. This may be caused by:  Some medicines.   A kidney infection.   Poisoning or consuming toxic substances.   A surgical wound.   A blow to the kidney area.   A problem with urine flow. This may be caused by:   Cancer.   Kidney stones.   An enlarged prostate. SYMPTOMS   Swelling (edema) of the legs, ankles, or feet.   Tiredness (lethargy).   Nausea or vomiting.    Confusion.   Problems with urination, such as:   Painful or burning feeling during urination.   Decreased urine production.   Frequent accidents in children who are potty trained.   Bloody urine.   Muscle twitches and cramps.   Shortness of breath.   Seizures.   Chest pain or pressure. Sometimes, no symptoms are present. DIAGNOSIS Acute kidney injury may be detected and diagnosed by tests, including blood, urine, imaging, or kidney biopsy tests.  TREATMENT Treatment of acute kidney injury varies depending on the cause and severity of the kidney damage. In mild cases, no treatment may be needed. The kidneys may heal on their own. If acute kidney injury is more severe, your caregiver will treat the cause of the kidney damage, help the kidneys heal, and prevent complications from occurring. Severe cases may require a procedure to remove toxic wastes from the body (dialysis) or surgery to repair kidney damage. Surgery may involve:   Repair of a torn kidney.   Removal of an obstruction. Most of the time, you will need to stay overnight at the hospital.  HOME CARE INSTRUCTIONS:  Follow your prescribed diet.  Only take over-the-counter or prescription medicines as directed by your caregiver.  Do not take any new medicines (prescription, over-the-counter, or nutritional supplements) unless approved by your caregiver. Many medicines can worsen your kidney damage or need to have the dose adjusted.   Keep all follow-up appointments as directed by your caregiver.  Observe your condition to make sure you are  healing as expected. SEEK IMMEDIATE MEDICAL CARE IF:  You are feeling ill or have severe pain in the back or side.   Your symptoms return or you have new symptoms.  You have any symptoms of end-stage kidney disease. These include:   Persistent itchiness.   Loss of appetite.   Headaches.   Abnormally dark or light skin.  Numbness in the hands or  feet.   Easy bruising.   Frequent hiccups.   Menstruation stops.   You have a fever.  You have increased urine production.  You have pain or bleeding when urinating. MAKE SURE YOU:   Understand these instructions.  Will watch your condition.  Will get help right away if you are not doing well or get worse Document Released: 04/24/2011 Document Revised: 02/03/2013 Document Reviewed: 06/07/2012 [facility] Patient Information 2015 [place], [place]. This information is not intended to replace advice given to you by your health care provider. Make sure you discuss any questions you have with your health care provider.

## 2014-10-16 NOTE — ED Provider Notes (Signed)
CSN: [id number]     Arrival date & time 10/16/14  1821 History   First MD Initiated Contact with Patient 10/16/14 1849     Chief Complaint  Patient presents with  . Abnormal Lab     (Consider location/radiation/quality/duration/timing/severity/associated sxs/prior Treatment) HPI  Chase Gibson is a 58 y.o. male with PMH of DM, COPD, Hyperlipidemia, anxiety presenting to ED due to elevated creatinine. Home health nurse contacted patient's infectious disease on-call physician who stated he was to present to the ED. Patient has had multiple right knee surgeries. His original total arthroplasty was 11 years ago. Since July patient states he has had 7 surgeries on the knee. Most recently December 12 patient had arthroplasty removed and had a spacer placed with IV antibiotics for knee infection. Patient states he has been taking 2 g vancomycin with last dose Wednesday. He denies fevers, chills, nausea, vomiting. Patient without any complaints other than persistent right knee pain without any changes. He takes 1tab of Norco for the pain with improvement. Movement makes it worse. Pain is throbbing in nature. No numbness, tingling, weakness, redness, swelling. Patient stated he had his staples removed yesterday and everything looked good. Patient's orthopedic physician is Dr. Skip Estimable in [place]. Patient also followed by Dr. Ola Spurr with ID.   Past Medical History  Diagnosis Date  . ALLERGIC RHINITIS 03/18/2009  . ANXIETY 06/24/2007  . ASTHMA 06/24/2007  . BREAST PAIN, LEFT 07/21/2008  . DEPRESSION 06/24/2007  . DIABETES MELLITUS, TYPE II 07/21/2008  . GERD 08/25/2008  . HYPERLIPIDEMIA 07/21/2008  . INSOMNIA-SLEEP DISORDER-UNSPEC 03/28/2010  . LOW BACK PAIN 09/03/2007  . NAUSEA 06/14/2010  . OSTEOARTHRITIS, KNEES, BILATERAL 08/25/2008  . PERIPHERAL EDEMA 07/21/2008  . SYMPTOM, ABNORMAL INVOLUNTARY MOVEMENT NEC 06/21/2007  . Wheezing 07/13/2009  . COPD (chronic obstructive pulmonary disease)  03/28/2012  . Colon polyps 03/28/2012   Past Surgical History  Procedure Laterality Date  . Right knee surgury      x 9  . Rotator cuff repair      bilateral  . Inguinal herniorrhapy    . Appendectomy    . Cholecystectomy    . Tonsillectomy    . Total knee arthroplasty      right  . Knee arthroscopy      left   Family History  Problem Relation Age of Onset  . Coronary artery disease Other   . Emphysema Mother     never smoker but spouse smoked  . Allergies Son   . Asthma Maternal Grandmother   . Rheum arthritis Sister    History  Substance Use Topics  . Smoking status: Current Every Day Smoker -- 0.50 packs/day for 35 years    Types: Cigarettes    Last Attempt to Quit: 12/02/2012  . Smokeless tobacco: Never Used  . Alcohol Use: No    Review of Systems  Constitutional: Negative for fever and chills.  HENT: Negative for congestion and rhinorrhea.   Eyes: Negative for visual disturbance.  Respiratory: Negative for cough and shortness of breath.   Cardiovascular: Negative for chest pain and palpitations.  Gastrointestinal: Negative for nausea, vomiting and diarrhea.  Genitourinary: Negative for dysuria and hematuria.  Musculoskeletal: Positive for arthralgias. Negative for back pain and joint swelling.  Skin: Negative for rash.  Neurological: Negative for weakness and headaches.      Allergies  Tramadol and Nsaids  Home Medications   Prior to Admission medications   Medication Sig Start Date End Date Taking? Authorizing Provider  albuterol (  VENTOLIN HFA) 108 (90 BASE) MCG/ACT inhaler Inhale 1-2 puffs into the lungs every 4 (four) hours as needed for wheezing or shortness of breath.    Yes Historical Provider, MD  aspirin EC 325 MG tablet Take 325 mg by mouth daily.   Yes Historical Provider, MD  HYDROcodone-acetaminophen (NORCO) 10-325 MG per tablet Take 1 tablet by mouth every 6 (six) hours as needed for moderate pain. 03/28/12  Yes Biagio Borg, MD  insulin  glargine (LANTUS) 100 UNIT/ML injection Inject 20 Units into the skin every evening.    Yes Historical Provider, MD  ipratropium-albuterol (DUONEB) 0.5-2.5 (3) MG/3ML SOLN Take 3 mLs by nebulization every 6 (six) hours as needed (shortness of breath).   Yes Historical Provider, MD  montelukast (SINGULAIR) 10 MG tablet Take 10 mg by mouth at bedtime.  09/28/11  Yes Biagio Borg, MD  oxyCODONE (OXY IR/ROXICODONE) 5 MG immediate release tablet Take 10 mg by mouth every 4 (four) hours as needed for severe pain.   Yes Historical Provider, MD  QUEtiapine (SEROQUEL) 200 MG tablet Take 200-600 mg by mouth at bedtime.   Yes Historical Provider, MD  simvastatin (ZOCOR) 10 MG tablet Take 10 mg by mouth daily.   Yes Historical Provider, MD  sitaGLIPtan-metformin (JANUMET) 50-1000 MG per tablet Take 1 tablet by mouth 2 (two) times daily. 09/28/11  Yes Biagio Borg, MD  budesonide-formoterol [facility]) 160-4.5 MCG/ACT inhaler Take 2 puffs first thing in am and then another 2 puffs about 12 hours later. Patient not taking: Reported on 10/16/2014 03/25/13   Tanda Rockers, MD  oxyCODONE-acetaminophen (PERCOCET/ROXICET) 5-325 MG per tablet Take 1 tablet by mouth every 6 (six) hours as needed for severe pain. Patient not taking: Reported on 10/16/2014 06/13/14   Domenic Moras, PA-C   BP 107/63 mmHg  Pulse 72  Temp(Src) 98.1 F (36.7 C) (Oral)  Resp 20  Wt 170 lb 9.6 oz (77.384 kg)  SpO2 95% Physical Exam  Constitutional: He appears well-developed and well-nourished. No distress.  HENT:  Head: Normocephalic and atraumatic.  Eyes: Conjunctivae and EOM are normal. Right eye exhibits no discharge. Left eye exhibits no discharge.  Cardiovascular: Normal rate, regular rhythm and normal heart sounds.   1+ pedal pulses equal bilaterally.   Pulmonary/Chest: Effort normal and breath sounds normal. No respiratory distress. He has no wheezes.  Abdominal: Soft. Bowel sounds are normal. He exhibits no distension. There is no  tenderness.  Musculoskeletal:  Patient with right extremity in long leg knee immobilizer.  Neurological: He is alert. He exhibits normal muscle tone. Coordination normal.  Strength and sensation intact  Skin: Skin is warm and dry. He is not diaphoretic.  Nursing note and vitals reviewed.   ED Course  Procedures (including critical care time) Labs Review Labs Reviewed  COMPREHENSIVE METABOLIC PANEL - Abnormal; Notable for the following:    Sodium 134 (*)    BUN 26 (*)    Creatinine, Ser 2.45 (*)    GFR calc non Af Amer 28 (*)    GFR calc Af Amer 32 (*)    All other components within normal limits  CBC WITH DIFFERENTIAL - Abnormal; Notable for the following:    RBC 2.99 (*)    Hemoglobin 9.0 (*)    HCT 27.5 (*)    RDW 16.0 (*)    All other components within normal limits  URINALYSIS, ROUTINE W REFLEX MICROSCOPIC - Abnormal; Notable for the following:    APPearance CLOUDY (*)  Protein, ur 30 (*)    All other components within normal limits  URINE MICROSCOPIC-ADD ON - Abnormal; Notable for the following:    Casts GRANULAR CAST (*)    All other components within normal limits  I-STAT CHEM 8, ED - Abnormal; Notable for the following:    Creatinine, Ser 2.30 (*)    Hemoglobin 9.2 (*)    HCT 27.0 (*)    All other components within normal limits  URINE CULTURE  VANCOMYCIN, RANDOM    Imaging Review No results found.   EKG Interpretation None      MDM   Final diagnoses:  AKI (acute kidney injury)  Infection of prosthetic knee joint, initial encounter   Patient with history of infection of prosthetic knee joint with recent surgery with spacer placement.  Patient has been on 2 g vancomycin IV at home. He was told to come to the ED because he had an elevated creatinine. Patient states his baseline is at 1.2 and his reading was 2.45 today. Patient denies any symptoms. VSS.  He was hydrated with IV fluids. Patient's ID physician Dr. Ola Spurr placed a vertebral order to  change his vancomycin to daptomycin. This was started in the ED. I spoke with Amie Critchley home health nurse supervisor for facilitating home health for continuation of this therapy as well as IV fluids for hydration at home. On repeat testing patient's creatinine improved to 2.3. Discussed possibility of being admitted to the hospital versus discharge home. Patient is adamantly against going home. Discussed risks versus benefits as well as risk of detrimental effects to health including the possibility of death. Patient verbalizes understanding. He has the capacity to make this decision. Wife at bedside who also agrees with plan. I have arranged home health continuation of therapy and patient is to follow with his ID physician as well.  Discussed return precautions with patient. Discussed all results and patient verbalizes understanding and agrees with plan.  This is a shared patient. This patient was discussed with the physician, Dr. Vanita Panda who saw and evaluated the patient and agrees with the plan.     Pura Spice, PA-C 10/17/14 0025  Carmin Muskrat, MD 10/17/14 [phone]

## 2014-10-17 LAB — URINE CULTURE
Colony Count: NO GROWTH
Culture: NO GROWTH

## 2014-11-23 DIAGNOSIS — R42 Dizziness and giddiness: Secondary | ICD-10-CM

## 2014-11-23 HISTORY — DX: Dizziness and giddiness: R42

## 2014-12-03 ENCOUNTER — Ambulatory Visit: Payer: Self-pay | Admitting: General Practice

## 2014-12-18 ENCOUNTER — Inpatient Hospital Stay: Payer: Self-pay | Admitting: General Practice

## 2014-12-18 HISTORY — PX: OTHER SURGICAL HISTORY: SHX169

## 2015-01-18 DIAGNOSIS — F4310 Post-traumatic stress disorder, unspecified: Secondary | ICD-10-CM | POA: Insufficient documentation

## 2015-02-13 NOTE — Op Note (Signed)
PATIENT NAME:  Chase Gibson, Chase Gibson MR#:  [id number] DATE OF BIRTH:  Apr 02, 1956  DATE OF PROCEDURE:  10/02/2014  PREOPERATIVE DIAGNOSIS: Right knee periprosthetic infection.   POSTOPERATIVE DIAGNOSIS: Right knee periprosthetic infection (methicillin-resistant Staphylococcus aureus).  PROCEDURE PERFORMED: Right knee arthrotomy, irrigation and excisional debridement, removal of right total knee implants and placement of antibiotic-impregnated cement spacer.   SURGEON: Laurice Record. Holley Bouche., MD   ASSISTANT: Vance Peper, PA (required to maintain retraction throughout the procedure).   ANESTHESIA: General.   ESTIMATED BLOOD LOSS: 250 mL.   FLUIDS REPLACED: 3200  mL of crystalloid.   TOURNIQUET TIME: 120 minutes.   DRAINS: 2 medium drains to Hemovac reservoir.   IMPLANTS UTILIZED: DePuy 10 mm standard plus LCS rotating platform polyethylene, three 40 gram batches of polymethylmethacrylate cement mixed with a total of 3 grams of vancomycin and 10.8 grams of tobramycin.   INDICATIONS FOR SURGERY: The patient is a 59 year old male who underwent a right total knee arthroplasty approximately 10 years ago in [place]. More recently, he has had increasing pain and stiffness of the knee and underwent right knee arthroscopy with extensive synovectomy and subsequent arthroscopic lavage. Cultures demonstrated methicillin-resistant staph aureus. The patient refused initial removal of implants and elected to proceed with a trial of antibiotic suppression. However, he was having increasing pain over the course of the last several weeks with increase in his C-reactive protein and sedimentation rate. Subsequent aspiration of the right knee demonstrated cultures positive for methicillin-resistant Staphylococcus aureus despite continuation on Bactrim. After discussion of the risks and benefits of surgical intervention, the patient expressed understanding of the risks and benefits, and agreed with plans for surgical  intervention.   PROCEDURE IN DETAIL: The patient was brought to the Operating Room and, after adequate general anesthesia was achieved, a tourniquet was placed on the patient's upper right thigh. The patient's right knee and leg were cleaned and prepped with alcohol and DuraPrep and draped in the usual sterile fashion. A "timeout" was performed as per usual protocol. The right lower extremity was elevated to allow for gravitational exsanguination of the knee. The tourniquet was inflated to 300 mmHg.   A medial curvilinear incision was made in line with the patient's previous surgical scar. Approximately 20 mL of cloudy yellow fluid was aspirated from the knee and submitted for cell count, gram stain, cultures and sensitivities. Moderate WBCs were seen but no organisms appreciated. A medial parapatellar incision was made with elevation of the deep fibers of the medial collateral ligament. The patella was subluxed laterally. Inspection of the knee demonstrated significant boggy synovitis. An extensive excisional synovectomy was performed using electrocautery. This included the medial and lateral gutters as well as the suprapatellar pouch. The polyethylene was removed, and excisional debridement of soft tissue was performed along the posterior recess of the knee. Tissue was submitted for gram stain as well as permanent section with few to moderate WBCs appreciated. No organisms appreciated. Thin flexible osteotomes were used at the implant bone interface along the first anterior flange and subsequent to the distal and posterior interfaces of the femoral component. An extractor device was attached to the femoral component, and the femoral component was removed with minimal loss of bone. The underlying bone was somewhat soft with some mucoid-type areas. Swabs of these areas were submitted for stat gram stain, culture and sensitivity with few to moderate WBCs appreciated. No organisms appreciated. The residual cement  was removed from the bone.   Next, attention was directed to the tibial  component. A thin TPS saw blade was used along the implant cement interface. There was an area along the medial tibial plateau with some loss of bone and irregularity as well as mucoid type material. Swabs were also submitted from this area. The interface completely around the anterior and posterior aspect of the tibial component had been adequately freed using a combination of osteotomes and TPS saw. A distraction/extraction device had been applied, but the tibial component was extremely stable. A stacked osteotome method was also utilized without any significant loosening of the tibial component. It was thus elected to perform tibial tubercle osteotomy. The cut measuring approximately 6 cm on the medial aspect of the tibial tubercle was performed with a transverse cut proximally and distally. Osteotomes were then used to elevate the tubercle, leaving a muscle and soft tissue hinge laterally. A large cement plug was intact, and this was split using cement splitters. A bone tamp and mallet were used to then tap the tibial component superiorly. Easy removal of the tibial component was achieved in this fashion with minimal bone loss.   Attention was directed at removal of the large bone cement plug along the proximal portion of the tibia. The tibial tubercle was then reduced and stabilization achieved using 3 wires. Excellent reduction was appreciated in this fashion with good stability noted. Finally, a TPS saw was used at the implant bone interface for the metal-backed patellar component. The patellar component was lifted off using a stacked osteotome method. A small TPS bur was used to debride cement from the peg holes. A reasonably good amount of bone was left on the patella.   Next, a tourniquet was subsequently deflated after a total tourniquet time of 120 minutes. Hemostasis was achieved using electrocautery. Over 1 mL of fluid was  used with the VersaJet for additional debridement of the soft tissue throughout the knee. The VersaJet was also used for additional debridement and cleaning of the bone surfaces. The wound was then again irrigated with 3 liters of normal saline with antibiotic solution. Fluid was suctioned dry. Dilute Betadine solution was then used and allowed to remain in the wound for approximately 5 minutes. Polymethylmethacrylate cement was mixed in 2 batches with the first batch using two 40 gram packages of cement mixed with 2 grams of vancomycin and 7.2 grams of tobramycin. This was placed in the femoral mold and then applied to the distal femur. After adequate curing of the cement, a second batch of 40 grams of methylmethacrylate was mixed with 1 gram of vancomycin and 3.6 grams of tobramycin. This was allowed to continue a doughy consistency and then placed on the tibial plateau. A 10 mm standard plus LCS RP polyethylene implant was modified by cutting the tip of the post and then placing it on the tibial plateau. Excess cement was removed using freer elevators and the knee was brought into full extension. After adequate curing of the cement, the wound was irrigated with an additional 3 liters of normal saline with antibiotic solution. Good hemostasis was appreciated. Two drains were placed in the wound bed and brought out through a separate stab incision to be attached to a Hemovac reservoir. The medial parapatellar incision was reapproximated using interrupted sutures of #1 Prolene. The subcutaneous tissue was approximated in layers using first #0 Monocryl followed by 2-0 Monocryl. The skin was closed with skin staples. A sterile dressing was applied followed by application of a range of motion brace locked in extension.   The patient tolerated  the procedure well. He was transported to the recovery room in stable condition.    ____________________________ Laurice Record. Holley Bouche., MD jph:at D: 10/03/2014 11:31:49  ET T: 10/03/2014 15:26:45 ET JOB#: [id number]  cc: Laurice Record. Holley Bouche., MD, <Dictator> Laurice Record Holley Bouche MD ELECTRONICALLY SIGNED 10/04/2014 21:56

## 2015-02-13 NOTE — Consult Note (Signed)
PATIENT NAME:  Chase Gibson, Chase Gibson MR#:  [id number] DATE OF BIRTH:  [birth year]  DATE OF CONSULTATION:  05/29/2014  REFERRING PHYSICIAN:  Laurice Record. Holley Bouche., MD CONSULTING PHYSICIAN:  Cheral Marker. Ola Spurr, MD  REFERRING PHYSICIAN:  Prosthetic joint infection.   HISTORY OF PRESENT ILLNESS: This is a pleasant 59 year old gentleman who had a prosthetic joint replacement in his right knee approximately 11 years ago. He had had issues since March of this year with increasing stiffness and lateral pain in it. He saw Dr. Marry Guan on July 20 and underwent debridement of some fibrotic tissue. There was no apparent evidence of infection at that time. However, the patient started to develop some pain and swelling over this site, and was seen July 28 in the orthopedic clinic.  Proximally, the sutures were removed. He was started on Keflex at that time; however, his swelling and pain persisted. He was then seen on August 5 and had aspiration of fluid from the joint that appeared infected. He was admitted and underwent a washout on August 5. White count in the fluid at that time in the clinic was elevated at 100,000. We are consulted for further antibiotic management.   PAST MEDICAL HISTORY:  1.  Diabetes, relatively well controlled.  2.  Hyperlipidemia.  3.  Chronic obstructive pulmonary disease.  4.  Psychiatric illness on Seroquel.   PAST SURGICAL HISTORY:  1.  Joint replacement in the right knee.  2.  Cholecystectomy.  3.  Rotator cuff surgery.  4.  Hernia repair.  5.  Appendectomy.  6.  Multiple knee surgeries.   FAMILY HISTORY: Noncontributory.   SOCIAL HISTORY: He is divorced. He lives alone. He is disabled. No alcohol or drug use. On review of systems, 11 systems were reviewed and were negative, except as per HPI.   PHYSICAL EXAMINATION:  VITAL SIGNS: Temperature 98.1, pulse 70, blood pressure 113/71, respirations 18, saturating 95% on room air  , afebrile.  HEENT: Pupils are equal, round and  reactive to light and accommodation. Extraocular movements are intact. Sclerae anicteric. Oropharynx clear.  NECK: Supple.  HEART: Regular.  LUNGS: Clear.  ABDOMEN: Soft, obese, nontender.  EXTREMITIES: Right knee is wrapped postoperatively. He has good pulses.   DATA:  Wound cultures from aspiration are pending. Wound cultures from August 5 surgery are growing moderate growth gram-positive cocci; further identification pending. White blood count on admission was 7.3, hemoglobin 11.6, platelets 254,000. Initial fluid aspiration from clinic had 111,000 white cells, 93% PMNs Follow up   was 26,000 with 100% PMNs. Renal function is normal with a creatinine of 1.07 in clinic on August 5. His sedimentation rate was 90, CRP 104.   IMPRESSION: A 59 year old previously relatively healthy male with a right prosthetic joint infection. Cultures are growing gram-positive cocci. He underwent a washout August 5, but the  material was not available for polyethylene exchange.   RECOMMENDATIONS:  1.  At this point, I would certainly recommend 6 weeks of IV antibiotics. Final antibiotics recommendations will be based on culture results, but he will likely need vancomycin if this is MRSA.  2.  He will likely end up needing resection of the prosthetic knee and a 2-stage revision. We did discuss this. If he does not undergo the revision, he would likely need lifelong antibiotic suppression.   Thank you for the consult. I will be glad to follow with you.     ____________________________ Cheral Marker. Ola Spurr, MD dpf:ms D: 05/29/2014 06:24:33 ET T: 05/29/2014 06:51:28 ET JOB#: [id number]  cc: Cheral Marker. Ola Spurr, MD, <Dictator> Denario Bagot Ola Spurr MD ELECTRONICALLY SIGNED 05/29/2014 22:10

## 2015-02-13 NOTE — Op Note (Signed)
PATIENT NAME:  Chase Gibson, Chase Gibson MR#:  [id number] DATE OF BIRTH:  Jan 26, 1956  DATE OF PROCEDURE:  05/27/2014  PREOPERATIVE DIAGNOSIS: Septic right knee status post remote total knee arthroplasty.   POSTOPERATIVE DIAGNOSIS: Septic right knee status post remote total knee arthroplasty (cultures pending).   PROCEDURE PERFORMED: Right knee arthroscopy, synovectomy, and extensive irrigation.   SURGEON: Skip Estimable, MD.   ANESTHESIA: General.   ESTIMATED BLOOD LOSS: Minimal.   DRAINS: None.   INDICATIONS FOR SURGERY: The patient is a 59 year old male who had a remote history of right total knee arthroplasty performed in [place]. The patient had done well until recently when he had noted some progressive right knee pain. Previous x-rays did not show any gross evidence of loosening. A relative patella baja was noted. Some soft tissue calcification was also appreciated. After discussion of the findings with the patient, he underwent right knee arthroscopy and extensive synovectomy approximately 2 weeks ago. He had done reasonably well initially but was noted to have some mild erythema to the lateral portal site at his 1-week visit. He was placed on Keflex initially. He returned to the office earlier today with increased swelling, erythema and pain. Turbid fluid was aspirated from the knee in the office and it was elected to proceed with arthroscopic irrigation and synovectomy pending final cultures. It should be noted that Gram stain from of the aspirate in the office showed many white cells but no organisms. Risks and benefits of surgical intervention were discussed with the patient. He expressed understanding of the risks and benefits, and agreed with plans for surgical intervention.   PROCEDURE IN DETAIL: The patient was brought into the operating room and after adequate general anesthesia was achieved, a tourniquet was placed on the patient's right thigh and the leg was placed in a leg holder. All  bony prominences were well padded. The patient's right knee and leg were cleaned and prepped with alcohol and DuraPrep and draped in the usual sterile fashion. A "timeout" was performed as per the usual protocol. The anticipated portal sites were injected with 0.25% Marcaine with epinephrine. An anterolateral portal was created and a cannula was inserted. Cloudy, somewhat brownish fluid was collected and submitted to the lab for Gram stain, culture and sensitivity and complete cell count.   The scope was inserted and the knee was distended with fluid using the pump. Under visualization with the scope an anteromedial portal was created and a hooked probe was inserted. Synovial tissue was noted to be extremely erythematous and inflamed.  A 4.5-mm shaver was inserted and synovectomy was performed starting first along the infrapatellar margin and then extending to both the medial and lateral gutters. The knee was extended and continued synovectomy was performed involving the suprapatellar pouch. A superolateral portal was created and the shaver was inserted and then continued synovectomy was performed in this area. Extensive synovectomy and debridement of tissue was performed using the 4.5-mm Incisor shaver. Hemostasis was achieved using the 50-degree ArthroCare wand. A total of 24,000 mL of fluid was used to irrigate the knee until clear.   The superolateral and anterolateral portals were  reapproximated using 3-0 nylon. The scope was removed and the anteromedial portal was reapproximated using 3-0 nylon. Marcaine 0.25% with epinephrine and 4 mg of morphine were injected into the joint. A sterile dressing was applied followed by application of an ice wrap. Gram stain and CBC information was not available at the conclusion of the case.   The patient tolerated the procedure  well. He was transported to the recovery room in stable condition.    ____________________________   jph:lt D: 05/28/2014 06:32:39  ET T: 05/28/2014 07:05:28 ET JOB#: [id number]  cc: Laurice Record. Holley Bouche., MD, <Dictator> JAMES P Holley Bouche MD ELECTRONICALLY SIGNED 06/03/2014 21:45

## 2015-02-13 NOTE — Op Note (Signed)
PATIENT NAME:  Chase Gibson, Chase Gibson MR#:  [id number] DATE OF BIRTH:  12/21/1955  DATE OF PROCEDURE:  05/11/2014  PREOPERATIVE DIAGNOSIS: Arthrofibrosis of the right knee status post a right total knee arthroplasty.   POSTOPERATIVE DIAGNOSIS: Arthrofibrosis of the right knee status post a right total knee arthroplasty.   PROCEDURE PERFORMED: Right knee arthroscopy and extensive synovectomy.   SURGEON: Dr. Skip Estimable.   ANESTHESIA: General.   ESTIMATED BLOOD LOSS: Minimal.   FLUIDS REPLACED: 750 mL.   INDICATIONS FOR SURGERY: The patient is a 59 year old male who underwent previous right total knee arthroplasty in [place] a number of years ago. More recently he has noted some anterior knee pain that has not responded to conservative nonsurgical intervention. No evidence of loosening was noted with radiographs, although there was a suggestion of a patellar baja. After discussion of the risks and benefits of surgical intervention, the patient expressed understanding of the risks, benefits, and agreed with plans for surgical intervention.   PROCEDURE IN DETAIL: The patient was brought to the operating room and, after adequate general anesthesia was achieved, a tourniquet was placed on the patient's right thigh and leg was placed in the leg holder. All bony prominences were well padded. The patient's right knee and leg were cleaned and prepped with alcohol and DuraPrep and draped in the usual sterile fashion. A "timeout" was performed as per usual protocol. The anticipated portal sites were injected with 0.25% Marcaine with epinephrine. An anterolateral portal was created and a cannula was inserted. The scope was inserted and the knee was inflated with fluid using the pump. Under visualization with the scope, an anteromedial portal was created and hooked probe was inserted. There was noted to be dense fibrotic tissue extending from the inferior pole of the patella and around the medial and lateral  aspect of the knee with some extension into the medial and lateral gutters. The 4.5-mm incisor shaver was inserted and debridement of the tissue in the infrapatellar region was initiated and continued around to first the medial gutter. Additional debridement was performed using the 50-degree ArthroCare wand. Extensive thick fibrotic tissue was debrided and hemostasis was achieved using the ArthroCare wand. Voluminous synovial tissue was debrided from the medial gutter using a combination of the ArthroCare wand and the incisor shaver. The scope was removed from the anterolateral portal and reinserted via the anteromedial portal so as to better visualize the lateral aspect. Again, debridement of the fibrotic tissue was continued from the inferior aspect of the patellar implant along the lateral aspect and into the lateral gutter. Inspection of the implant showed no significant evidence of wear. Additional debridement was performed along the lateral gutter and the scope was positioned so as to visualize the patellofemoral articulation with the knee in full extension. Some debridement was performed in the suprapatellar pouch with hemostasis achieved using electrocautery.   The knee was irrigated with copious amounts of fluid and then suctioned dry. The anterolateral portal was reapproximated using a number 3-0 nylon. A combination of 0.25% Marcaine with epinephrine and 4 mg of morphine was injected via the scope. The scope was removed and the anteromedial portal was reapproximated using a number 3-0 nylon. Sterile dressing was applied followed by application of ice wrap. The patient tolerated the procedure well. He was transported to the recovery room in stable condition.   ____________________________ Laurice Record. Holley Bouche., MD jph:lt D: 05/11/2014 18:39:56 ET T: 05/12/2014 03:23:45 ET JOB#: [id number]  cc: Laurice Record. Holley Bouche., MD, <Dictator> Jeneen Rinks  Evalina Field MD ELECTRONICALLY SIGNED 05/12/2014 19:46

## 2015-02-15 LAB — SURGICAL PATHOLOGY

## 2015-02-21 NOTE — Consult Note (Signed)
PATIENT NAME:  Chase Gibson, Chase Gibson MR#:  [id number] DATE OF BIRTH:  1956/08/28  DATE OF CONSULTATION:  12/21/2014  REFERRING PHYSICIAN:  Laurice Record. Holley Bouche., MD  CONSULTING PHYSICIAN:  Cheral Marker. Ola Spurr, MD  REASON FOR CONSULT: prosthetic joint infection.   HISTORY OF PRESENT ILLNESS: This is a pleasant 59 year old gentleman with a complicated joint history. He originally underwent implant of his knee on the right in 2004. In July 2015 he had to have debridement of some fibrotic tissue. There was no apparent evidence of infection at that time, however, on July 28 he presented to the orthopedic clinic and was started on Keflex for some swelling and pain. On 05/27/2014 he underwent aspiration of fluid that appeared infected with 100,000 white cells and a culture with MRSA. The patient underwent washout on August 5. He was treated initially with 6 weeks of vancomycin and then transitioned to oral suppression with Bactrim b.i.d. in September. Initially his infection was under control but it flared and he had to have explant of the right knee on December 11. He was treated with another 6 weeks of IV antibiotics, initially with vancomycin and then changed to daptomycin due to issues with worsening renal function. The patient finished IV antibiotics January 22. He was then followed in the clinic and was admitted for 2-stage revision on February 26. However, during the procedure he was noted to have Gram stain showing gram-positive cocci. A decision was made to place another antibiotic spacer and the patient has been started on vancomycin. We are consulted for further antibiotic management.   PAST MEDICAL HISTORY:  1.  Diabetes, well controlled.  2.  Osteoarthritis.  3.  Crohn disease.  4.  COPD.   5.  Hyperlipidemia.   SURGICAL HISTORY:  1.  Joint replacement bilaterally.  2.  Cholecystectomy.  3.  Rotator cuff surgery.  4.  Hernia repair.  5.  Appendectomy.  6.  Knee surgery as above for this  infection.   FAMILY HISTORY: Noncontributory.   SOCIAL HISTORY: He is divorced. He lives alone. He is disabled. No alcohol or drug use.   REVIEW OF SYSTEMS:  Eleven systems were reviewed and negative except as per HPI.   PHYSICAL EXAMINATION:  VITAL SIGNS: Temperature afebrile. Vital signs are stable.  GENERAL: He is pleasant, interactive, in no acute distress.  HEENT: Pupils reactive. Sclerae anicteric. Oropharynx clear.  NECK: Supple.  HEART: Regular.  LUNGS: Clear.  ABDOMEN: Soft, nontender, nondistended. No hepatosplenomegaly.  EXTREMITIES: No clubbing, cyanosis or edema.  NEUROLOGIC: He is alert and oriented x 3. Grossly nonfocal neuro exam.  MUSCULOSKELETAL: His right knee is in a cooling wrap.   DATA: Gram stain from intraoperative samples and 2 serologies have gram-positive cocci in singles.  Rare white blood cells were seen.  So far there has been no growth since February 26. White blood count on February 26 was 7.5, hemoglobin 8.7, platelets 138.  Synovial fluid on the 26th had 4558 nucleated cells, 82% PMNs. Renal function February 29 was 2.17. Estimated GFR 33%.   IMPRESSION: A 59 year old gentleman with previous methicillin-resistant Staphylococcus aureus infection of prosthetic knee now status post explant on December followed by 6 weeks of IV antibiotics including vancomycin followed by daptomycin complicated by renal failure. He underwent explant of the antibiotic spacer on February 26, however, Gram stain was positive on 2 samples for gram-positive cocci so decision was made to place another antibiotic spacer. Cultures are negative so far.   I had a long discussion  with the patient about the need for another 6 weeks of IV antibiotics, but given his issues with renal failure presumably due to the vancomycin we will continue him on daptomycin which he has received before. We will give an 8 mg/kg dosing. This will be given  q. 24 hours. He will need careful monitoring of the CBC  CMET as well as CRP and CPK weekly. Following 6 weeks of antibiotics following his ESR and CRP we will attempt reimplantation.   Thank you for the consult. I will be glad to follow with you.    ____________________________ Cheral Marker. Ola Spurr, MD dpf:AT D: 12/21/2014 21:53:11 ET T: 12/21/2014 23:03:09 ET JOB#: [id number]  cc: Cheral Marker. Ola Spurr, MD, <Dictator> Kayen Grabel Ola Spurr MD ELECTRONICALLY SIGNED 12/24/2014 19:54

## 2015-02-21 NOTE — Op Note (Signed)
PATIENT NAME:  Chase Gibson, Chase Gibson MR#:  [id number] DATE OF BIRTH:  [birth year]  DATE OF PROCEDURE:  12/18/2014    PREOPERATIVE DIAGNOSIS: Right knee periprosthetic infection (methicillin-resistant Staphylococcus aureus) status post placement of antibiotic-impregnated cement spacer.   POSTOPERATIVE DIAGNOSIS: Right knee periprosthetic infection (methicillin-resistant Staphylococcus aureus) status post placement of antibiotic-impregnated cement spacer, (cultures pending).   PROCEDURE PERFORMED: Right knee arthrotomy, removal of antibiotic-impregnated cement spacer, excisional debridement and irrigation of the right knee, and re-implantation of antibiotic-impregnated cement spacer.   SURGEON: Skip Estimable, MD   ASSISTANT: Marylee Floras, PA (required to maintain retraction throughout the procedure).   ANESTHESIA: General.   ESTIMATED BLOOD LOSS: 200 mL.   FLUIDS REPLACED: 1900 mL of crystalloid.   TOURNIQUET TIME: 123 minutes.   DRAINS: Two medium drains to Hemovac reservoir.   IMPLANTS UTILIZED: DePuy 10 mm LCS standard plus rotating platform, polyethylene inserts.   Antibiotic cement spacer was utilized with 10.8 grams of tobramycin and 3 grams of vancomycin and three 40 grams packs of polymethyl methacrylate.   INDICATIONS FOR SURGERY: The patient is a 59 year old male who had removal of right total knee implants approximately 8 weeks ago and underwent irrigation, debridement and placement of antibiotic spacer for methicillin-resistant Staphylococcus aureus periprosthetic infection. The patient received 6 weeks of antibiotics with improvement of condition of the knee. He has been off antibiotics for the last 2 weeks. This staged procedure was attempted for removal of the cement spacer and possible re-implantation pending intraoperative findings. Risks and benefits of surgical intervention were discussed in detail with the patient. He expressed understanding of the risks, benefits, and agreed  with plans for surgical intervention.   PROCEDURE IN DETAIL: The patient was brought into the Operating Room and, after adequate general anesthesia was achieved, a tourniquet was placed on the patient's upper right thigh. The patient's right knee and leg were cleaned and prepped with alcohol and DuraPrep, draped in the usual sterile fashion.  A "timeout" was performed as per usual protocol. The right lower extremity was elevated in order to exsanguinate the leg and the tourniquet was inflated to 300 mmHg.  An anterior incision was made in line with the previous surgical scar. The knee was then aspirated with approximately 15 mL of cloudy fluid expressed.  Fluid was submitted for complete cell count, culture and sensitivity.  Complete cell count ultimately demonstrated no organisms and approximately 4000 white cells.  A medial parapatellar arthrotomy was performed and previous Prolene sutures were removed.  The deep fibers of the medial collateral ligament were elevated in a subperiosteal fashion off the medial flare of the tibia so as to maintain a continuous soft tissue sleeve. The previous wires maintaining the tibial tubercle osteotomy were intact with evidence of healing of the osteotomy site. The patella was subluxed laterally. Medial and lateral gutters were re-established.  Synovial tissue along the medial gutter was sharply excised using scalpel and submitted for histology, Gram stain, culture and sensitivity. Polymethyl methacrylate cement spacer was removed from the femur intact. The polyethylene liner was removed. Cement from the tibial aspect was removed in its entirety. Debridement tissue from the bone was undertaken using a combination of curettes and rongeurs.  Swabs were obtained from the femur, the femoral canal, the tibial canal and submitted for stat Gram stain, culture and sensitivity.  While awaiting those results, the femur and tibia were prepared for re-implantation.  Pilot hole for reaming  of the proximal of the femoral canal was performed and serial reamers were  inserted by hand up to a 14 mm diameter.  In a similar fashion, intramedullary reamers were introduced into the tibial bypassing the wires. Reaming for the tibia was continued up to a 14 mm diameter. The femoral canal was enlarged distally using the reamer and then a 31 mm femoral broach was inserted.  The distal femoral cutting guide was positioned and resection was performed with anticipation of using 4 mm of distal augments both medially and laterally along the distal aspect.  Next, the 3-in-1 cutting guide was positioned and rotation assessed.  Using the +2 setting, the anterior cut was performed and verified.  Next, posterior cuts were performed with anticipation of placing a 4 mm augments both medially and laterally. Chamfer cuts were then performed. A cutting guide for a central box was then positioned and the central box cut was performed in anticipation of using a TC3 femoral component. Guides were then removed.  At this point, the report from the lab was received with rare gram-positive cocci noted from the synovial tissue on the medial side of the knee as well as to the tibial canal. These swabs demonstrates 0 to 5 white cells per high powered field. The remaining swabs showed 0 to 5 white cells with no organisms. Given 2 swabs demonstrating evidence of gram-positive cocci, it was elected to abort re-implantation and proceed with further debridement.  Soft tissue along the medial, lateral, suprapatellar, and posterior recesses was debrided using Versajet.  Good hemostasis was achieved after tourniquet had been deflated for a total tourniquet time of 123 minutes. The knee was then soaked for a minimum of 5 minutes in dilute Betadine solution of 17.5 mL of Betadine, and 500 mL of normal saline. The Betadine solution was then suctioned dry. Then 7.6 grams of tobramycin and 2 grams of vancomycin were mixed in two 40 grams batches of  polymethyl methacrylate cement. The cement was placed in size 3 femoral mold and the cement was applied to the distal femur. After adequate curing of cement, the mold was removed. Next, the second batch of the cement was repaired with 3.6 grams of tobramycin and 1 gram of vancomycin in 40 grams batch of cement.  This was allowed to achieve a doughy consistency which was placed on the tibial plateau. A 10 mm standard plus LCS polyethylene was then placed and excess cement was removed using freer elevators. The knee was brought into full extension.  After adequate curing of cement, the knee was again irrigated with an additional 3 liters of normal saline with antibiotic solution.  Two medium drains were placed in the wound bed and brought out through a separate stab incision to be attached to a Hemovac reservoir.  The medial parapatellar portion of the incision was reapproximated using interrupted sutures of #1 Prolene. The subcutaneous tissue was approximated in layers using first #0 Monocryl followed by 2-0 Monocryl.  Skin was closed with skin staples.  A sterile dressing was applied.    The patient tolerated the procedure well. He was transported to the recovery room in stable condition.     ____________________________ Laurice Record. Holley Bouche., MD jph:DT D: 12/18/2014 17:23:34 ET T: 12/19/2014 14:22:28 ET JOB#: [id number]  cc: Laurice Record. Holley Bouche., MD, <Dictator> Laurice Record Holley Bouche MD ELECTRONICALLY SIGNED 01/09/2015 16:47

## 2015-02-21 NOTE — Consult Note (Signed)
Brief Consult Note: Diagnosis: Prosthetic joint infection, CKD.   Patient was seen by consultant.   Consult note dictated.   Recommend to proceed with surgery or procedure.   Recommend further assessment or treatment.   Orders entered.   Comments: Place PICC Daptomycin at 8 mg/kg q 24 for 6 weeks Discussed with pt today. Ok for dc once PICC placed.  Electronic Signatures: Angelena Form (MD)  (Signed [phone] 09:02)  Authored: Brief Consult Note   Last Updated: 29-Feb-16 09:02 by Angelena Form (MD)

## 2015-02-21 NOTE — Discharge Summary (Signed)
PATIENT NAME:  Chase Gibson, Chase Gibson MR#:  [id number] DATE OF BIRTH:  05-Nov-1955  DATE OF ADMISSION:  12/18/2014 DATE OF DISCHARGE:    ADMITTING DIAGNOSIS: Right knee periprosthetic methicillin-resistant Staphylococcus aureus infection with antibiotic-impregnated cement spacer.   DISCHARGE DIAGNOSIS: Right knee periprosthetic methicillin-resistant Staphylococcus aureus infection with antibiotic-impregnated cement spacer.   HISTORY: The patient is a 59 year old gentleman who has been followed at [facility] for a periprosthetic right knee infection. The patient then underwent a right knee arthroscopy in 2004 by Dr. Telford Nab in [place]. The patient began having problems with the right knee once again in 2015, and subsequently underwent 2 right knee arthroscopies. In December 2015, the patient underwent surgery for removal of implants followed by insertion of antibiotic-impregnated cement spacer. The patient was on antibiotic treatment 6 weeks prior to surgery and then off 2 weeks. C-reactive protein and sedimentation rates were performed after the discontinuation of antibiotics. These were elevated but had improved from his surgery in December. The patient states that the knee was feeling much better. He is not having any pain. He actually had improved with his appetite as well. The patient was seen ambulating without any assistive devices, even though he was supposed to be non-weightbearing to the right leg as well using the knee immobilizer. The patient's A1c recently taken by his primary care was supposedly 5.2. This is not documented. After discussion of the risks and benefits of surgical intervention, the patient expressed his understanding of the risks and benefits and agreed for plan for surgical intervention.   HOSPITAL COURSE AND PROCEDURE: Right knee arthrotomy, removal of antibiotic-impregnated cement spacer, excisional debridement with irrigation of the right knee as well as reimplantation of  antibiotic-impregnated cement spacer.   ANESTHESIA: General.   IMPLANTS UTILIZED: DePuy 10-mm LCS Standard Plus rotating platform polyethylene insert. Antibiotic cement spacers was utilized with 10.8 grams of tobramycin and 3 grams of vancomycin and 340-gram packet of polymethylmethacrylate.   The patient tolerated the procedure very well. He had no complications. He was then taken to the PACU where he was stabilized and then transferred to the orthopedic floor. The patient began receiving anticoagulation therapy of Lovenox 30 mg subcutaneous every 12 hours per anesthesia and pharmacy protocol. He was fitted with TED stockings bilaterally. The right one was applied on day 2 following removal of the Hemovac and dressing change. He was also fitted with the AVI compression foot pumps bilaterally set at 80 mmHg. His calves have been nontender. There has been no evidence of any DVTs. Negative Homans sign. Heels were elevated off the bed using rolled towels.   The patient has denied any chest pains or shortness of breath. Vital signs have been stable. He has been afebrile. Hemodynamically, the patient did receive 1 unit of packed RBCs secondary to his hemoglobin being 7.5. After the transfusion, it was 8.6. He was having some vertigo on day 2 but, upon being discharged, this had cleared.   Cultures taken in the OR showed on 2 of these to have a gram-positive cocci but there was no growth at 48 hours. The remaining cultures were no organisms seen. There was a total of 7 cultures taken. Dr. Ola Spurr was consulted during the OR case as well as postoperatively as far as the antibiotic treatment. The patient also received a PICC line on the day of discharge for IV antibiotics.  Physical therapy was initiated on day 1 for gait training. He was able to ambulate greater than 150 feet, was independent with bed  to chair transfers. The patient was also initiated for ADL assistive devices.   The patient's Hemovac and  IV were discontinued initially on day 2 followed by changing the dressing on day 3. The wound has been completely dry of any drainage or any signs of infection. There was only minimal swelling. Overall, the right knee looked very good. He was having very little pain. Neurovascular and neurosensory appeared to be intact and within normal limits.   DISPOSITION: The patient is being discharged to home in improved stable condition. He is to be non-weightbearing to the right lower extremity. The range of motion knee brace is to be on at all times locked in extension. He is to elevate the lower extremity. Elevate the heels off the bed. Incentive spirometer q. 1 hour while awake. Encouraged on cough, deep breathing q. 2 hours while awake. He is placed on a diabetic diet. Continue Polar Care maintaining a temperature of 40 to 50 degrees Fahrenheit. He was instructed on wound care. He will follow up in the clinic on February 10 at 9:15, sooner if any temperatures of 101.5 or greater or excessive bleeding.  He may resume his regular medications that he was on prior to admission. He was given a prescription for oxycodone 5 to 10 mg every 4 to 6 hours p.r.n. for pain, Norco 5/325 mg 1 to 2 every 6 hours p.r.n., Skelaxin 800 mg 1 tablet t.i.d. p.r.n., Lovenox 40 mg subcutaneously daily for 14 days then discontinue and begin taking one 81-mg enteric-coated aspirin.   PAST MEDICAL HISTORY: Diabetes mellitus, hyperlipidemia, COPD, Crohn disease, infection of periprosthetic right total knee arthroplasty.   ____________________________ Vance Peper, PA jrw:jh D: 12/21/2014 11:32:31 ET T: 12/21/2014 11:53:02 ET JOB#: [id number]  cc: Vance Peper, PA, <Dictator> JON WOLFE PA ELECTRONICALLY SIGNED 12/22/2014 21:10

## 2015-02-21 NOTE — Discharge Summary (Signed)
PATIENT NAME:  Chase Gibson, Chase Gibson MR#:  [id number] DATE OF BIRTH:  03-20-1956  DATE OF ADMISSION:  10/02/2014 DATE OF DISCHARGE:  10/05/2014  ADMITTING DIAGNOSIS: Right knee periprosthetic infection.   DISCHARGE DIAGNOSES:  1.  Right knee periprosthetic infection. 2.  Methicillin resistant Staphylococcus aureus.   PROCEDURES PERFORMED: Right knee arthrotomy, irrigation and excisional debridement, removal of right total knee implants and placement of antibiotic impregnated cement spacer.   SURGEON: Juanito Doom., M.D.   ASSISTANT: Marylee Floras, PA.  ANESTHESIA: General.   ESTIMATED BLOOD LOSS: 250 mL.  FLUIDS REPLACED: 3200 mL of crystalloid.   TOURNIQUET TIME: 120 minutes.   DRAINS: Two medium drains to Hemovac reservoir.   IMPLANTS UTILIZED: DePuy 10 mm standard plus LCS rotating platform, polyethylene, 340 gram patches of polymethyl acrylate cement mixed with a total of 3 grams of vancomycin and 10.8 grams of tobramycin.   HISTORY: The patient is a 59 year old male who underwent a right total knee arthroplasty 10 years ago in [place]. More recently he had increased pain and stiffness of the knee and underwent right knee arthroscopy with extensive synovectomy and subsequent arthroscopic lavage. Cultures demonstrated methicillin-resistant Staphylococcus aureus. The patient refused initial removal of the implants and elected to proceed with a trial of antibiotic suppression. However, he has been having increased pain over the course of the last several weeks with increase in C reactive protein and sedimentation rate. Subsequent aspiration of the right knee demonstrated cultures positive for methicillin-resistant staph aureus despite continuation of Bactrim. After discussion of the risks and benefits of surgical intervention, the patient expressed understanding of the risks and benefits and agreed with plans for surgical intervention.   HOSPITAL COURSE: The patient was admitted to the  hospital on October 02, 2014. He had surgery that same day and was brought to the orthopedic floor from the PAC-U in stable condition. On postop day 1, the patient's hemoglobin was stable at 8.8. He was toe-touch weight-bearing with physical therapy and had slow progress. PICC line was placed and pain was controlled with oxycodone. On postop day 2, the patient's hemoglobin dropped down to 7.4. Vital signs were stable. He was transfused 1 unit of packed red blood cells and labs were rechecked the following morning. Infectious disease consult was pending. On postop day 3, October 05, 2014, hemoglobin was up to 8.1. He was asymptomatic. Pain was well controlled. Infectious disease saw the patient and it was determined that he would continue with vancomycin for 6 weeks and follow up with Dr. Ola Spurr on 10/27/2014. The patient was stable and ready for discharge to home with home health.   CONDITION AT DISCHARGE: Stable.   DISCHARGE INSTRUCTIONS: The patient may keep the leg elevated. He needs to use incentive spirometer every hour while awake. Encourage cough and deep breathing. Toe-touch weight-bearing right lower extremity knee. Knee immobilizer at all times. The patient may resume a regular diet as tolerated. Continue using Polar Care unit maintaining temp between 40 to 50 degrees. Do not get the dressing or bandage wet or dirty. Call  [facility] ortho if the dressing gets water under it. Leave the dressing on. Call [facility] ortho if you experience any bright red bleeding from the incision wound, fever above 101.5 degrees, redness, swelling, or drainage at the incision. Call [facility] ortho if you experience any increased leg pain, numbness or weakness in your legs or bowel or bladder symptoms. Home physical therapy has been arranged for continuation of rehab program. Please call [facility]  ortho if a therapist has not contacted you within 48 hours of your return home. The patient needs  to be seen in [place] in 2 weeks.   DISCHARGE MEDICATIONS: Please see discharge medications on discharge instructions.   ____________________________ T. Rachelle Hora, PA-C tcg:sb D: 10/26/2014 12:40:48 ET T: 10/26/2014 13:43:16 ET JOB#: [id number]  cc: T. Rachelle Hora, PA-C, <Dictator> Duanne Guess [place] ELECTRONICALLY SIGNED 11/03/2014 8:33

## 2015-02-25 ENCOUNTER — Encounter
Admission: RE | Admit: 2015-02-25 | Discharge: 2015-02-25 | Disposition: A | Discharge status: Home / Self Care | Payer: Medicare Other | Source: Ambulatory Visit | Attending: Orthopedic Surgery | Admitting: Orthopedic Surgery

## 2015-02-25 DIAGNOSIS — N183 Chronic kidney disease, stage 3 (moderate): Secondary | ICD-10-CM | POA: Insufficient documentation

## 2015-02-25 DIAGNOSIS — K219 Gastro-esophageal reflux disease without esophagitis: Secondary | ICD-10-CM | POA: Insufficient documentation

## 2015-02-25 DIAGNOSIS — E785 Hyperlipidemia, unspecified: Secondary | ICD-10-CM | POA: Diagnosis not present

## 2015-02-25 DIAGNOSIS — R42 Dizziness and giddiness: Secondary | ICD-10-CM | POA: Diagnosis not present

## 2015-02-25 DIAGNOSIS — E119 Type 2 diabetes mellitus without complications: Secondary | ICD-10-CM | POA: Diagnosis not present

## 2015-02-25 DIAGNOSIS — Z01812 Encounter for preprocedural laboratory examination: Secondary | ICD-10-CM | POA: Insufficient documentation

## 2015-02-25 DIAGNOSIS — J449 Chronic obstructive pulmonary disease, unspecified: Secondary | ICD-10-CM | POA: Diagnosis not present

## 2015-02-25 HISTORY — DX: Dizziness and giddiness: R42

## 2015-02-25 LAB — BASIC METABOLIC PANEL
Anion gap: 5 (ref 5–15)
BUN: 20 mg/dL (ref 6–20)
CALCIUM: 9.2 mg/dL (ref 8.9–10.3)
CO2: 25 mmol/L (ref 22–32)
CREATININE: 2.23 mg/dL — AB (ref 0.61–1.24)
Chloride: 109 mmol/L (ref 101–111)
GFR calc Af Amer: 36 mL/min — ABNORMAL LOW (ref >60)
GFR, EST NON AFRICAN AMERICAN: 31 mL/min — AB (ref >60)
GLUCOSE: 103 mg/dL — AB (ref 65–99)
Potassium: 5.4 mmol/L — ABNORMAL HIGH (ref 3.5–5.1)
Sodium: 139 mmol/L (ref 135–145)

## 2015-02-25 LAB — CBC
HEMATOCRIT: 29.1 % — AB (ref 40.0–52.0)
Hemoglobin: 9.7 g/dL — ABNORMAL LOW (ref 13.0–18.0)
MCH: 31.5 pg (ref 26.0–34.0)
MCHC: 33.3 g/dL (ref 32.0–36.0)
MCV: 94.7 fL (ref 80.0–100.0)
PLATELETS: 149 10*3/uL — AB (ref 150–440)
RBC: 3.07 MIL/uL — ABNORMAL LOW (ref 4.40–5.90)
RDW: 16.6 % — ABNORMAL HIGH (ref 11.5–14.5)
WBC: 4.1 10*3/uL (ref 3.8–10.6)

## 2015-02-25 LAB — APTT: APTT: 32 s (ref 24–36)

## 2015-02-25 LAB — PROTIME-INR
INR: 0.93
PROTHROMBIN TIME: 12.7 s (ref 11.4–15.0)

## 2015-02-25 LAB — SURGICAL PCR SCREEN
MRSA, PCR: NEGATIVE
STAPHYLOCOCCUS AUREUS: NEGATIVE

## 2015-02-25 LAB — TYPE AND SCREEN
ABO/RH(D): A POS
Antibody Screen: NEGATIVE

## 2015-02-25 LAB — SEDIMENTATION RATE: Sed Rate: 65 mm/hr — ABNORMAL HIGH (ref 0–20)

## 2015-02-25 NOTE — Patient Instructions (Signed)
  Your procedure is scheduled on: Mar 12, 2015 Report to Same Day Surgery.. To find out your arrival time please call [phone] between 1PM - 3PM on Mar 11, 2015.  Remember: Instructions that are not followed completely may result in serious medical risk, up to and including death, or upon the discretion of your surgeon and anesthesiologist your surgery may need to be rescheduled.    __x__ 1. Do not eat food or drink liquids after midnight. No gum chewing or hard candies.     __x__ 2. No Alcohol for 24 hours before or after surgery.   ____ 3. Bring all medications with you on the day of surgery if instructed.    __x__ 4. Notify your doctor if there is any change in your medical condition     (cold, fever, infections).     Do not wear jewelry, make-up, hairpins, clips or nail polish.  Do not wear lotions, powders, or perfumes. You may wear deodorant.  Do not shave 48 hours prior to surgery. Men may shave face and neck.  Do not bring valuables to the hospital.    [facility] is not responsible for any belongings or valuables.               Contacts, dentures or bridgework may not be worn into surgery.  Leave your suitcase in the car. After surgery it may be brought to your room.  For patients admitted to the hospital, discharge time is determined by your                treatment team.   Patients discharged the day of surgery will not be allowed to drive home.   Please read over the following fact sheets that you were given:   [facility] Preparing for Surgery   ____ Take these medicines the morning of surgery with A SIP OF WATER:    1. simvastatin (ZOCOR)   2.montelukast (SINGULAIR)  3.   4.  5.  6.  ____ Fleet Enema (as directed)   __x__ Use CHG Soap as directed  _x___ Use inhalers on the day of surgery  ____ Stop metformin 2 days prior to surgery    ____ Take 1/2 of usual insulin dose the night before surgery and none on the morning of surgery.   ____ Stop  Coumadin/Plavix/aspirin on Mar 05, 2015.  ____ Stop Anti-inflammatories on Mar 05, 2015.  ____ Stop supplements until after surgery.    ____ Bring C-Pap to the hospital.

## 2015-02-26 LAB — HEMOGLOBIN A1C: HEMOGLOBIN A1C: 5.9 % (ref 4.0–6.0)

## 2015-02-28 LAB — ABO/RH: ABO/RH(D): A POS

## 2015-03-01 ENCOUNTER — Inpatient Hospital Stay
Admission: RE | Admit: 2015-03-01 | Discharge: 2015-03-01 | Disposition: A | Discharge status: Home / Self Care | Payer: Medicare Other | Source: Ambulatory Visit

## 2015-03-02 NOTE — Pre-Procedure Instructions (Signed)
Pt's chart sent to anesthesia for review of abnormal labs. Pt did not come in yesterday as agreed to obtain urine for UA C&S. Pt was called yesterday 1630 pm to ask pt to come back in for labs, message left on voicemail for pt to call PAT to reschedule labs.

## 2015-03-02 NOTE — Pre-Procedure Instructions (Signed)
Spoke with Margaretha Sheffield Dr. Clydell Hakim nurse, she confirmed pt has an appt to have blood work done at [facility] on 03/04/15.  She will add the UA with C&S to be done at [facility] on 03/04/15.  I will dc the UA C&S from [facility] oders.

## 2015-03-02 NOTE — Pre-Procedure Instructions (Signed)
Called Dr. Clydell Hakim office, spoke with Isa Rankin, RN not avaiable. Informed of pt unable to void for UA C&S at PAT visit and pt did not show for  Labs on 03/01/2015 as agreed upon at PAT visit on 02/26/15, and message left on pt's voicemail to reschedule labs.

## 2015-03-03 NOTE — OR Nursing (Signed)
Request for clearance faxed to [facility] at Dr Marry Guan.

## 2015-03-09 ENCOUNTER — Inpatient Hospital Stay (HOSPITAL_COMMUNITY)
Admission: EM | Admit: 2015-03-09 | Discharge: 2015-03-11 | DRG: 248 | Disposition: A | Discharge status: Home / Self Care | Payer: Medicare Other | Attending: Cardiology | Admitting: Cardiology

## 2015-03-09 ENCOUNTER — Encounter (HOSPITAL_COMMUNITY): Payer: Self-pay | Admitting: Emergency Medicine

## 2015-03-09 ENCOUNTER — Emergency Department (HOSPITAL_COMMUNITY): Payer: Medicare Other

## 2015-03-09 DIAGNOSIS — E785 Hyperlipidemia, unspecified: Secondary | ICD-10-CM | POA: Diagnosis present

## 2015-03-09 DIAGNOSIS — Z79899 Other long term (current) drug therapy: Secondary | ICD-10-CM

## 2015-03-09 DIAGNOSIS — Z8249 Family history of ischemic heart disease and other diseases of the circulatory system: Secondary | ICD-10-CM

## 2015-03-09 DIAGNOSIS — Z7951 Long term (current) use of inhaled steroids: Secondary | ICD-10-CM

## 2015-03-09 DIAGNOSIS — Z720 Tobacco use: Secondary | ICD-10-CM | POA: Diagnosis present

## 2015-03-09 DIAGNOSIS — R079 Chest pain, unspecified: Secondary | ICD-10-CM | POA: Diagnosis not present

## 2015-03-09 DIAGNOSIS — K219 Gastro-esophageal reflux disease without esophagitis: Secondary | ICD-10-CM | POA: Diagnosis present

## 2015-03-09 DIAGNOSIS — Z794 Long term (current) use of insulin: Secondary | ICD-10-CM

## 2015-03-09 DIAGNOSIS — E872 Acidosis: Secondary | ICD-10-CM | POA: Diagnosis not present

## 2015-03-09 DIAGNOSIS — E1169 Type 2 diabetes mellitus with other specified complication: Secondary | ICD-10-CM | POA: Diagnosis present

## 2015-03-09 DIAGNOSIS — I214 Non-ST elevation (NSTEMI) myocardial infarction: Secondary | ICD-10-CM | POA: Diagnosis not present

## 2015-03-09 DIAGNOSIS — F411 Generalized anxiety disorder: Secondary | ICD-10-CM | POA: Diagnosis present

## 2015-03-09 DIAGNOSIS — J45909 Unspecified asthma, uncomplicated: Secondary | ICD-10-CM | POA: Diagnosis present

## 2015-03-09 DIAGNOSIS — I25110 Atherosclerotic heart disease of native coronary artery with unstable angina pectoris: Secondary | ICD-10-CM | POA: Diagnosis present

## 2015-03-09 DIAGNOSIS — E1122 Type 2 diabetes mellitus with diabetic chronic kidney disease: Secondary | ICD-10-CM | POA: Diagnosis present

## 2015-03-09 DIAGNOSIS — N179 Acute kidney failure, unspecified: Secondary | ICD-10-CM

## 2015-03-09 DIAGNOSIS — J449 Chronic obstructive pulmonary disease, unspecified: Secondary | ICD-10-CM | POA: Diagnosis present

## 2015-03-09 DIAGNOSIS — F32A Depression, unspecified: Secondary | ICD-10-CM | POA: Diagnosis present

## 2015-03-09 DIAGNOSIS — N189 Chronic kidney disease, unspecified: Secondary | ICD-10-CM | POA: Diagnosis present

## 2015-03-09 DIAGNOSIS — M179 Osteoarthritis of knee, unspecified: Secondary | ICD-10-CM | POA: Diagnosis present

## 2015-03-09 DIAGNOSIS — N170 Acute kidney failure with tubular necrosis: Secondary | ICD-10-CM | POA: Diagnosis not present

## 2015-03-09 DIAGNOSIS — N184 Chronic kidney disease, stage 4 (severe): Secondary | ICD-10-CM | POA: Diagnosis present

## 2015-03-09 DIAGNOSIS — N289 Disorder of kidney and ureter, unspecified: Secondary | ICD-10-CM

## 2015-03-09 DIAGNOSIS — I2510 Atherosclerotic heart disease of native coronary artery without angina pectoris: Secondary | ICD-10-CM

## 2015-03-09 DIAGNOSIS — Z9861 Coronary angioplasty status: Secondary | ICD-10-CM

## 2015-03-09 DIAGNOSIS — Z8261 Family history of arthritis: Secondary | ICD-10-CM

## 2015-03-09 DIAGNOSIS — F329 Major depressive disorder, single episode, unspecified: Secondary | ICD-10-CM | POA: Diagnosis present

## 2015-03-09 DIAGNOSIS — I249 Acute ischemic heart disease, unspecified: Secondary | ICD-10-CM

## 2015-03-09 DIAGNOSIS — D649 Anemia, unspecified: Secondary | ICD-10-CM | POA: Diagnosis present

## 2015-03-09 DIAGNOSIS — F17210 Nicotine dependence, cigarettes, uncomplicated: Secondary | ICD-10-CM | POA: Diagnosis present

## 2015-03-09 DIAGNOSIS — Z7902 Long term (current) use of antithrombotics/antiplatelets: Secondary | ICD-10-CM

## 2015-03-09 DIAGNOSIS — Z96651 Presence of right artificial knee joint: Secondary | ICD-10-CM | POA: Diagnosis present

## 2015-03-09 DIAGNOSIS — Z7982 Long term (current) use of aspirin: Secondary | ICD-10-CM

## 2015-03-09 DIAGNOSIS — Z825 Family history of asthma and other chronic lower respiratory diseases: Secondary | ICD-10-CM

## 2015-03-09 DIAGNOSIS — IMO0001 Reserved for inherently not codable concepts without codable children: Secondary | ICD-10-CM | POA: Diagnosis present

## 2015-03-09 DIAGNOSIS — E119 Type 2 diabetes mellitus without complications: Secondary | ICD-10-CM

## 2015-03-09 DIAGNOSIS — M1710 Unilateral primary osteoarthritis, unspecified knee: Secondary | ICD-10-CM | POA: Diagnosis present

## 2015-03-09 HISTORY — DX: Chronic kidney disease, stage 3 (moderate): N18.3

## 2015-03-09 HISTORY — DX: Atherosclerotic heart disease of native coronary artery without angina pectoris: I25.10

## 2015-03-09 HISTORY — DX: Chronic kidney disease, stage 3 unspecified: N18.30

## 2015-03-09 HISTORY — DX: Coronary angioplasty status: Z98.61

## 2015-03-09 LAB — COMPREHENSIVE METABOLIC PANEL
ALT: 10 U/L — ABNORMAL LOW (ref 17–63)
AST: 16 U/L (ref 15–41)
Albumin: 3.5 g/dL (ref 3.5–5.0)
Alkaline Phosphatase: 130 U/L — ABNORMAL HIGH (ref 38–126)
Anion gap: 10 (ref 5–15)
BUN: 27 mg/dL — ABNORMAL HIGH (ref 6–20)
CALCIUM: 10.3 mg/dL (ref 8.9–10.3)
CO2: 23 mmol/L (ref 22–32)
CREATININE: 2.84 mg/dL — AB (ref 0.61–1.24)
Chloride: 105 mmol/L (ref 101–111)
GFR calc non Af Amer: 23 mL/min — ABNORMAL LOW (ref >60)
GFR, EST AFRICAN AMERICAN: 27 mL/min — AB (ref >60)
Glucose, Bld: 136 mg/dL — ABNORMAL HIGH (ref 65–99)
Potassium: 5 mmol/L (ref 3.5–5.1)
Sodium: 138 mmol/L (ref 135–145)
Total Bilirubin: 0.3 mg/dL (ref 0.3–1.2)
Total Protein: 6.7 g/dL (ref 6.5–8.1)

## 2015-03-09 LAB — I-STAT TROPONIN, ED: TROPONIN I, POC: 0.26 ng/mL — AB (ref 0.00–0.08)

## 2015-03-09 LAB — CBC
HEMATOCRIT: 29.6 % — AB (ref 39.0–52.0)
HEMOGLOBIN: 9.9 g/dL — AB (ref 13.0–17.0)
MCH: 30.9 pg (ref 26.0–34.0)
MCHC: 33.4 g/dL (ref 30.0–36.0)
MCV: 92.5 fL (ref 78.0–100.0)
Platelets: 153 10*3/uL (ref 150–400)
RBC: 3.2 MIL/uL — ABNORMAL LOW (ref 4.22–5.81)
RDW: 15.3 % (ref 11.5–15.5)
WBC: 5.7 10*3/uL (ref 4.0–10.5)

## 2015-03-09 MED ORDER — NITROGLYCERIN IN D5W 200-5 MCG/ML-% IV SOLN
10.0000 ug/min | INTRAVENOUS | Status: DC
  Administered 2015-03-10: 10 ug/min via INTRAVENOUS
  Filled 2015-03-09: qty 250

## 2015-03-09 NOTE — ED Notes (Signed)
Dr. Glick at bedside.  

## 2015-03-09 NOTE — ED Notes (Signed)
Pt reports he experienced centralized chest pain yesterday however it went away, pt reports chest pain returned this evening with radiation to left arm. Pt reports he has never had trouble with reflux however thought maybe that was what his chest pain was, tried antacids however had no relief. EMS adm 1 nitroglycerin and pt reports chest pain relief.

## 2015-03-09 NOTE — ED Provider Notes (Signed)
CSN: [id number]     Arrival date & time 03/09/15  2249 History   This chart was scribed for Chase Fuel, MD by Chester Holstein, ED Scribe. This patient was seen in room D36C/D36C and the patient's care was started at 11:24 PM.    Chief Complaint  Patient presents with  . Chest Pain     The history is provided by the patient. No language interpreter was used.   HPI Comments: Chase Gibson is a 59 y.o. male brought in by ambulance, with PMHx of DM, HLD, GERD, and COPD who presents to the Emergency Department complaining of 6-7/10 intermittent central pressure-like chest pains, lasting 30-45 minutes at a time, with first onset yesterday morning. Pt took an antacid with relief. Pt reports chest pain returned yesterday morning, for which he took an antacid with relief. Pt states pain returned for a 3rd time this evening. He notes associated nausea and pain radiating into left arm. He denies any aggravating or alleviating factors. He denies similar pain in the past.  Pt was given NTG and ASA en route to ED with relief. Pt reports he has a total knee arthroplasty scheduled for 03/12/15. Pt is a smoker <1ppd. Pt denies SOB, diaphoresis, and vomiting. Pt's PCP is Dr. Moreen Fowler.   Past Medical History  Diagnosis Date  . ALLERGIC RHINITIS 03/18/2009  . ASTHMA 06/24/2007  . BREAST PAIN, LEFT 07/21/2008  . DEPRESSION 06/24/2007  . DIABETES MELLITUS, TYPE II 07/21/2008  . HYPERLIPIDEMIA 07/21/2008  . INSOMNIA-SLEEP DISORDER-UNSPEC 03/28/2010  . LOW BACK PAIN 09/03/2007  . NAUSEA 06/14/2010  . OSTEOARTHRITIS, KNEES, BILATERAL 08/25/2008  . PERIPHERAL EDEMA 07/21/2008  . SYMPTOM, ABNORMAL INVOLUNTARY MOVEMENT NEC 06/21/2007  . Wheezing 07/13/2009  . COPD (chronic obstructive pulmonary disease) 03/28/2012  . Colon polyps 03/28/2012  . GERD 08/25/2008    pt denies at PAT visit on 02/25/15  . ANXIETY 06/24/2007    pt denies at PAT visit on 02/25/15  . Vertigo 11/2014    pt has a bout every 3-4 months, no pattern   Past  Surgical History  Procedure Laterality Date  . Right knee surgury      x 9  . Rotator cuff repair Bilateral     bilateral  . Inguinal herniorrhapy Bilateral     1979 & 1997  . Appendectomy    . Cholecystectomy    . Tonsillectomy    . Total knee arthroplasty Right     right  . Knee arthroscopy Left     left   Family History  Problem Relation Age of Onset  . Coronary artery disease Other   . Emphysema Mother     never smoker but spouse smoked  . Allergies Son   . Asthma Maternal Grandmother   . Rheum arthritis Sister    History  Substance Use Topics  . Smoking status: Current Every Day Smoker -- 1.00 packs/day for 35 years    Types: Cigarettes    Last Attempt to Quit: 12/02/2012  . Smokeless tobacco: Never Used  . Alcohol Use: No    Review of Systems  Constitutional: Negative for diaphoresis.  Respiratory: Negative for shortness of breath.   Cardiovascular: Positive for chest pain.  Gastrointestinal: Positive for nausea. Negative for vomiting.  Musculoskeletal: Positive for myalgias.  All other systems reviewed and are negative.     Allergies  Tramadol and Nsaids  Home Medications   Prior to Admission medications   Medication Sig Start Date End Date Taking? Authorizing Provider  albuterol (VENTOLIN HFA) 108 (90 BASE) MCG/ACT inhaler Inhale 1-2 puffs into the lungs every 4 (four) hours as needed for wheezing or shortness of breath.     Historical Provider, MD  aspirin EC 325 MG tablet Take 325 mg by mouth daily.    Historical Provider, MD  budesonide-formoterol (SYMBICORT) 160-4.5 MCG/ACT inhaler Take 2 puffs first thing in am and then another 2 puffs about 12 hours later. Patient not taking: Reported on 10/16/2014 03/25/13   Tanda Rockers, MD  HYDROcodone-acetaminophen [facility]) 10-325 MG per tablet Take 1 tablet by mouth every 6 (six) hours as needed for moderate pain. 03/28/12   Biagio Borg, MD  insulin glargine (LANTUS) 100 UNIT/ML injection Inject 20 Units into  the skin every evening.     Historical Provider, MD  ipratropium-albuterol (DUONEB) 0.5-2.5 (3) MG/3ML SOLN Take 3 mLs by nebulization every 6 (six) hours as needed (shortness of breath).    Historical Provider, MD  montelukast (SINGULAIR) 10 MG tablet Take 10 mg by mouth once.  09/28/11   Biagio Borg, MD  oxyCODONE (OXY IR/ROXICODONE) 5 MG immediate release tablet Take 10 mg by mouth every 4 (four) hours as needed for severe pain.    Historical Provider, MD  oxyCODONE-acetaminophen (PERCOCET/ROXICET) 5-325 MG per tablet Take 1 tablet by mouth every 6 (six) hours as needed for severe pain. Patient not taking: Reported on 10/16/2014 06/13/14   Domenic Moras, PA-C  QUEtiapine (SEROQUEL) 200 MG tablet Take 200-600 mg by mouth at bedtime.    Historical Provider, MD  simvastatin (ZOCOR) 10 MG tablet Take 20 mg by mouth daily.     Historical Provider, MD  sitaGLIPtan-metformin (JANUMET) 50-1000 MG per tablet Take 1 tablet by mouth 2 (two) times daily. 09/28/11   Biagio Borg, MD  sitaGLIPtin (JANUVIA) 50 MG tablet Take 50 mg by mouth daily.    Historical Provider, MD  varenicline (CHANTIX) 1 MG tablet Take 1 mg by mouth 2 (two) times daily.    Historical Provider, MD   BP 110/59 mmHg  Pulse 78  Temp(Src) 97.9 F (36.6 C) (Oral)  Resp 24  Ht 5\' 10"  (1.778 m)  Wt 170 lb (77.111 kg)  BMI 24.39 kg/m2  SpO2 96% Physical Exam  Constitutional: He is oriented to person, place, and time. He appears well-developed and well-nourished.  HENT:  Head: Normocephalic.  Eyes: Conjunctivae are normal.  Neck: Normal range of motion. Neck supple.  Cardiovascular: Normal rate, regular rhythm and normal heart sounds.  Exam reveals no friction rub.   No murmur heard. Pulmonary/Chest: Effort normal and breath sounds normal. No respiratory distress. He has no wheezes. He has no rales.  Abdominal: Soft. There is no tenderness.  Musculoskeletal: Normal range of motion.  Neurological: He is alert and oriented to person,  place, and time.  Skin: Skin is warm and dry.  Psychiatric: He has a normal mood and affect. His behavior is normal.  Nursing note and vitals reviewed.   ED Course  Procedures (including critical care time) DIAGNOSTIC STUDIES: Oxygen Saturation is 99% on room air, normal by my interpretation.    COORDINATION OF CARE: 11:34 PM Discussed treatment plan with patient at beside, the patient agrees with the plan and has no further questions at this time.   Labs Review Labs Reviewed  CBC - Abnormal; Notable for the following:    RBC 3.20 (*)    Hemoglobin 9.9 (*)    HCT 29.6 (*)    All other components within  normal limits  COMPREHENSIVE METABOLIC PANEL - Abnormal; Notable for the following:    Glucose, Bld 136 (*)    BUN 27 (*)    Creatinine, Ser 2.84 (*)    ALT 10 (*)    Alkaline Phosphatase 130 (*)    GFR calc non Af Amer 23 (*)    GFR calc Af Amer 27 (*)    All other components within normal limits  I-STAT TROPOININ, ED - Abnormal; Notable for the following:    Troponin i, poc 0.26 (*)    All other components within normal limits  HEPARIN LEVEL (UNFRACTIONATED)  CBC    Imaging Review   EKG Interpretation   Date/Time:  Tuesday Mar 09 2015 22:48:11 EDT Ventricular Rate:  78 PR Interval:  142 QRS Duration: 99 QT Interval:  406 QTC Calculation: 462 R Axis:   34 Text Interpretation:  Age not entered, assumed to be  59 years old for  purpose of ECG interpretation Sinus rhythm Low voltage, extremity and  precordial leads Abnrm T, consider ischemia, anterolateral lds Baseline  wander in lead(s) III When compared with ECG of 05/05/2014, T wave  abnormality is now Present Confirmed by [facility]  MD, Lylah Lantis ([id number]) on  03/09/2015 10:59:43 PM     Results for orders placed or performed during the hospital encounter of 03/09/15  CBC  Result Value Ref Range   WBC 5.7 4.0 - 10.5 K/uL   RBC 3.20 (L) 4.22 - 5.81 MIL/uL   Hemoglobin 9.9 (L) 13.0 - 17.0 g/dL   HCT 29.6 (L) 39.0 -  52.0 %   MCV 92.5 78.0 - 100.0 fL   MCH 30.9 26.0 - 34.0 pg   MCHC 33.4 30.0 - 36.0 g/dL   RDW 15.3 11.5 - 15.5 %   Platelets 153 150 - 400 K/uL  Comprehensive metabolic panel  Result Value Ref Range   Sodium 138 135 - 145 mmol/L   Potassium 5.0 3.5 - 5.1 mmol/L   Chloride 105 101 - 111 mmol/L   CO2 23 22 - 32 mmol/L   Glucose, Bld 136 (H) 65 - 99 mg/dL   BUN 27 (H) 6 - 20 mg/dL   Creatinine, Ser 2.84 (H) 0.61 - 1.24 mg/dL   Calcium 10.3 8.9 - 10.3 mg/dL   Total Protein 6.7 6.5 - 8.1 g/dL   Albumin 3.5 3.5 - 5.0 g/dL   AST 16 15 - 41 U/L   ALT 10 (L) 17 - 63 U/L   Alkaline Phosphatase 130 (H) 38 - 126 U/L   Total Bilirubin 0.3 0.3 - 1.2 mg/dL   GFR calc non Af Amer 23 (L) >60 mL/min   GFR calc Af Amer 27 (L) >60 mL/min   Anion gap 10 5 - 15  I-stat troponin, ED (not at [facility], [facility])  Result Value Ref Range   Troponin i, poc 0.26 (HH) 0.00 - 0.08 ng/mL   Comment NOTIFIED PHYSICIAN    Comment 3           Dg Chest 2 View  03/09/2015   CLINICAL DATA:  Acute onset of chest pain.  Initial encounter.  EXAM: CHEST  2 VIEW  COMPARISON:  Chest radiograph performed 07/07/2013  FINDINGS: The lungs are well-aerated. Pulmonary vascularity is at the upper limits of normal. Density near the lung bases is thought to reflect overlying soft tissues and possibly gynecomastia, better seen than on prior studies. No mass is seen on the lateral view. There is no evidence of focal opacification, pleural  effusion or pneumothorax.  The heart is normal in size; the mediastinal contour is within normal limits. No acute osseous abnormalities are seen. The patient is status post right-sided rotator cuff repair.  IMPRESSION: No acute cardiopulmonary process seen.   Electronically Signed   By: Garald Balding M.D.   On: 03/09/2015 23:36   12:28 AM-Consult complete with cardiology. Patient case explained and discussed. Call ended at 12:31 AM   1:05 AM-Consult complete with hospitalist. Patient case explained and  discussed. Hospitalist agrees to admit patient for further evaluation and treatment. Call ended at 1:06 AM    CRITICAL CARE Performed by: LSLHT,DSKAJ Total critical care time: 50 minutes Critical care time was exclusive of separately billable procedures and treating other patients. Critical care was necessary to treat or prevent imminent or life-threatening deterioration. Critical care was time spent personally by me on the following activities: development of treatment plan with patient and/or surrogate as well as nursing, discussions with consultants, evaluation of patient's response to treatment, examination of patient, obtaining history from patient or surrogate, ordering and performing treatments and interventions, ordering and review of laboratory studies, ordering and review of radiographic studies, pulse oximetry and re-evaluation of patient's condition.   MDM   Final diagnoses:  Acute coronary syndrome  Normochromic normocytic anemia  Acute on chronic renal insufficiency    Chest pain very worrisome for cardiac related pain. ECG shows new T-wave inversions worrisome for either non-STEMI or recent MI. Patient was advised that he would need further evaluation the hospital. He immediately stated that he is scheduled to haveher removed from his right knee and total knee replacement done on Friday and that he is going to have that done the matter what. He stated that he was going to leave the hospital because he was not can let anything it away of his knee surgery. He was advised of the risk of death should he leave as well as the fact that his surgeon would not operate on him with evidence of a recent heart attack. Family eventually was able to convince the patient is stable. Troponin has come back positive. He is started on heparin and nitroglycerin drips. Laboratory workup shows slight worsening of chronic renal insufficiency and stable anemia as well as elevated troponin. Case is discussed  with Dr. Philbert Riser of cardiology service who states he will see the patient counseled request the patient be admitted to the medicine service. Case is discussed with Dr. Blaine Hamper of [facility] who agrees to admit the patient.   I personally performed the services described in this documentation, which was scribed in my presence. The recorded information has been reviewed and is accurate.       Chase Fuel, MD 68/11/57 [id number]

## 2015-03-10 ENCOUNTER — Encounter (HOSPITAL_COMMUNITY): Payer: Self-pay | Admitting: Internal Medicine

## 2015-03-10 ENCOUNTER — Other Ambulatory Visit: Payer: Self-pay

## 2015-03-10 ENCOUNTER — Encounter (HOSPITAL_COMMUNITY)
Admission: EM | Disposition: A | Discharge status: Home / Self Care | Payer: Medicare Other | Source: Home / Self Care | Attending: Internal Medicine

## 2015-03-10 ENCOUNTER — Inpatient Hospital Stay (HOSPITAL_COMMUNITY): Payer: Medicare Other

## 2015-03-10 ENCOUNTER — Other Ambulatory Visit (HOSPITAL_COMMUNITY): Payer: Self-pay

## 2015-03-10 DIAGNOSIS — J45909 Unspecified asthma, uncomplicated: Secondary | ICD-10-CM | POA: Diagnosis present

## 2015-03-10 DIAGNOSIS — M25562 Pain in left knee: Secondary | ICD-10-CM

## 2015-03-10 DIAGNOSIS — Z8249 Family history of ischemic heart disease and other diseases of the circulatory system: Secondary | ICD-10-CM | POA: Diagnosis not present

## 2015-03-10 DIAGNOSIS — Z7982 Long term (current) use of aspirin: Secondary | ICD-10-CM | POA: Diagnosis not present

## 2015-03-10 DIAGNOSIS — E785 Hyperlipidemia, unspecified: Secondary | ICD-10-CM | POA: Diagnosis present

## 2015-03-10 DIAGNOSIS — N179 Acute kidney failure, unspecified: Secondary | ICD-10-CM

## 2015-03-10 DIAGNOSIS — F411 Generalized anxiety disorder: Secondary | ICD-10-CM | POA: Diagnosis present

## 2015-03-10 DIAGNOSIS — I249 Acute ischemic heart disease, unspecified: Secondary | ICD-10-CM

## 2015-03-10 DIAGNOSIS — F329 Major depressive disorder, single episode, unspecified: Secondary | ICD-10-CM | POA: Diagnosis present

## 2015-03-10 DIAGNOSIS — I2510 Atherosclerotic heart disease of native coronary artery without angina pectoris: Secondary | ICD-10-CM | POA: Diagnosis not present

## 2015-03-10 DIAGNOSIS — N289 Disorder of kidney and ureter, unspecified: Secondary | ICD-10-CM | POA: Diagnosis present

## 2015-03-10 DIAGNOSIS — Z79899 Other long term (current) drug therapy: Secondary | ICD-10-CM | POA: Diagnosis not present

## 2015-03-10 DIAGNOSIS — Z825 Family history of asthma and other chronic lower respiratory diseases: Secondary | ICD-10-CM | POA: Diagnosis not present

## 2015-03-10 DIAGNOSIS — M179 Osteoarthritis of knee, unspecified: Secondary | ICD-10-CM | POA: Diagnosis present

## 2015-03-10 DIAGNOSIS — M25561 Pain in right knee: Secondary | ICD-10-CM

## 2015-03-10 DIAGNOSIS — N184 Chronic kidney disease, stage 4 (severe): Secondary | ICD-10-CM | POA: Diagnosis present

## 2015-03-10 DIAGNOSIS — J449 Chronic obstructive pulmonary disease, unspecified: Secondary | ICD-10-CM | POA: Diagnosis present

## 2015-03-10 DIAGNOSIS — K219 Gastro-esophageal reflux disease without esophagitis: Secondary | ICD-10-CM | POA: Diagnosis present

## 2015-03-10 DIAGNOSIS — N183 Chronic kidney disease, stage 3 unspecified: Secondary | ICD-10-CM | POA: Insufficient documentation

## 2015-03-10 DIAGNOSIS — Z7951 Long term (current) use of inhaled steroids: Secondary | ICD-10-CM | POA: Diagnosis not present

## 2015-03-10 DIAGNOSIS — N170 Acute kidney failure with tubular necrosis: Secondary | ICD-10-CM | POA: Diagnosis not present

## 2015-03-10 DIAGNOSIS — D649 Anemia, unspecified: Secondary | ICD-10-CM | POA: Diagnosis present

## 2015-03-10 DIAGNOSIS — I509 Heart failure, unspecified: Secondary | ICD-10-CM | POA: Diagnosis not present

## 2015-03-10 DIAGNOSIS — N189 Chronic kidney disease, unspecified: Secondary | ICD-10-CM

## 2015-03-10 DIAGNOSIS — E872 Acidosis: Secondary | ICD-10-CM | POA: Diagnosis not present

## 2015-03-10 DIAGNOSIS — I25110 Atherosclerotic heart disease of native coronary artery with unstable angina pectoris: Secondary | ICD-10-CM | POA: Diagnosis present

## 2015-03-10 DIAGNOSIS — E119 Type 2 diabetes mellitus without complications: Secondary | ICD-10-CM

## 2015-03-10 DIAGNOSIS — K210 Gastro-esophageal reflux disease with esophagitis: Secondary | ICD-10-CM

## 2015-03-10 DIAGNOSIS — I214 Non-ST elevation (NSTEMI) myocardial infarction: Secondary | ICD-10-CM | POA: Diagnosis present

## 2015-03-10 DIAGNOSIS — E1122 Type 2 diabetes mellitus with diabetic chronic kidney disease: Secondary | ICD-10-CM | POA: Diagnosis present

## 2015-03-10 DIAGNOSIS — R079 Chest pain, unspecified: Secondary | ICD-10-CM | POA: Diagnosis present

## 2015-03-10 DIAGNOSIS — Z720 Tobacco use: Secondary | ICD-10-CM | POA: Diagnosis present

## 2015-03-10 DIAGNOSIS — Z8261 Family history of arthritis: Secondary | ICD-10-CM | POA: Diagnosis not present

## 2015-03-10 DIAGNOSIS — F17210 Nicotine dependence, cigarettes, uncomplicated: Secondary | ICD-10-CM | POA: Diagnosis present

## 2015-03-10 DIAGNOSIS — Z96651 Presence of right artificial knee joint: Secondary | ICD-10-CM | POA: Diagnosis present

## 2015-03-10 DIAGNOSIS — M1710 Unilateral primary osteoarthritis, unspecified knee: Secondary | ICD-10-CM | POA: Diagnosis present

## 2015-03-10 DIAGNOSIS — Z7902 Long term (current) use of antithrombotics/antiplatelets: Secondary | ICD-10-CM | POA: Diagnosis not present

## 2015-03-10 HISTORY — PX: TRANSTHORACIC ECHOCARDIOGRAM: SHX275

## 2015-03-10 HISTORY — PX: CARDIAC CATHETERIZATION: SHX172

## 2015-03-10 LAB — RAPID URINE DRUG SCREEN, HOSP PERFORMED
Amphetamines: NOT DETECTED
Barbiturates: NOT DETECTED
Benzodiazepines: NOT DETECTED
COCAINE: NOT DETECTED
OPIATES: NOT DETECTED
Tetrahydrocannabinol: NOT DETECTED

## 2015-03-10 LAB — URINALYSIS, ROUTINE W REFLEX MICROSCOPIC
BILIRUBIN URINE: NEGATIVE
Glucose, UA: NEGATIVE mg/dL
HGB URINE DIPSTICK: NEGATIVE
KETONES UR: NEGATIVE mg/dL
Leukocytes, UA: NEGATIVE
NITRITE: NEGATIVE
PH: 5 (ref 5.0–8.0)
Protein, ur: NEGATIVE mg/dL
Specific Gravity, Urine: 1.008 (ref 1.005–1.030)
Urobilinogen, UA: 0.2 mg/dL (ref 0.0–1.0)

## 2015-03-10 LAB — COMPREHENSIVE METABOLIC PANEL
ALT: 11 U/L — AB (ref 17–63)
AST: 16 U/L (ref 15–41)
Albumin: 3.5 g/dL (ref 3.5–5.0)
Alkaline Phosphatase: 127 U/L — ABNORMAL HIGH (ref 38–126)
Anion gap: 8 (ref 5–15)
BUN: 26 mg/dL — AB (ref 6–20)
CHLORIDE: 106 mmol/L (ref 101–111)
CO2: 25 mmol/L (ref 22–32)
CREATININE: 2.86 mg/dL — AB (ref 0.61–1.24)
Calcium: 11.1 mg/dL — ABNORMAL HIGH (ref 8.9–10.3)
GFR calc Af Amer: 27 mL/min — ABNORMAL LOW (ref >60)
GFR calc non Af Amer: 23 mL/min — ABNORMAL LOW (ref >60)
Glucose, Bld: 109 mg/dL — ABNORMAL HIGH (ref 65–99)
Potassium: 5 mmol/L (ref 3.5–5.1)
Sodium: 139 mmol/L (ref 135–145)
Total Bilirubin: 0.4 mg/dL (ref 0.3–1.2)
Total Protein: 7.1 g/dL (ref 6.5–8.1)

## 2015-03-10 LAB — CBC
HCT: 27.5 % — ABNORMAL LOW (ref 39.0–52.0)
HEMATOCRIT: 30.6 % — AB (ref 39.0–52.0)
HEMOGLOBIN: 10.1 g/dL — AB (ref 13.0–17.0)
Hemoglobin: 9.2 g/dL — ABNORMAL LOW (ref 13.0–17.0)
MCH: 30.8 pg (ref 26.0–34.0)
MCH: 31.2 pg (ref 26.0–34.0)
MCHC: 33 g/dL (ref 30.0–36.0)
MCHC: 33.5 g/dL (ref 30.0–36.0)
MCV: 93.3 fL (ref 78.0–100.0)
MCV: 93.2 fL (ref 78.0–100.0)
PLATELETS: 144 10*3/uL — AB (ref 150–400)
Platelets: 169 10*3/uL (ref 150–400)
RBC: 3.28 MIL/uL — ABNORMAL LOW (ref 4.22–5.81)
RBC: 2.95 MIL/uL — AB (ref 4.22–5.81)
RDW: 15.4 % (ref 11.5–15.5)
RDW: 15.5 % (ref 11.5–15.5)
WBC: 6.4 10*3/uL (ref 4.0–10.5)
WBC: 5.2 10*3/uL (ref 4.0–10.5)

## 2015-03-10 LAB — PROTIME-INR
INR: 1.11 (ref 0.00–1.49)
Prothrombin Time: 14.4 seconds (ref 11.6–15.2)

## 2015-03-10 LAB — POCT ACTIVATED CLOTTING TIME: ACTIVATED CLOTTING TIME: 429 s

## 2015-03-10 LAB — IRON AND TIBC
Iron: 121 ug/dL (ref 45–182)
Saturation Ratios: 45 % — ABNORMAL HIGH (ref 17.9–39.5)
TIBC: 267 ug/dL (ref 250–450)
UIBC: 146 ug/dL

## 2015-03-10 LAB — CREATININE, URINE, RANDOM
Creatinine, Urine: 55.65 mg/dL
Creatinine, Urine: 34.37 mg/dL

## 2015-03-10 LAB — GLUCOSE, CAPILLARY
GLUCOSE-CAPILLARY: 103 mg/dL — AB (ref 65–99)
GLUCOSE-CAPILLARY: 86 mg/dL (ref 65–99)
Glucose-Capillary: 115 mg/dL — ABNORMAL HIGH (ref 65–99)

## 2015-03-10 LAB — SODIUM, URINE, RANDOM
SODIUM UR: 113 mmol/L
Sodium, Ur: 91 mmol/L

## 2015-03-10 LAB — URIC ACID: URIC ACID, SERUM: 5 mg/dL (ref 4.4–7.6)

## 2015-03-10 LAB — HEPARIN LEVEL (UNFRACTIONATED)
Heparin Unfractionated: 0.12 IU/mL — ABNORMAL LOW (ref 0.30–0.70)
Heparin Unfractionated: 0.2 IU/mL — ABNORMAL LOW (ref 0.30–0.70)

## 2015-03-10 LAB — CBG MONITORING, ED: GLUCOSE-CAPILLARY: 106 mg/dL — AB (ref 65–99)

## 2015-03-10 LAB — APTT: APTT: 88 s — AB (ref 24–37)

## 2015-03-10 LAB — TROPONIN I
TROPONIN I: 0.32 ng/mL — AB (ref <0.031)
Troponin I: 0.43 ng/mL — ABNORMAL HIGH (ref <0.031)
Troponin I: 0.39 ng/mL — ABNORMAL HIGH (ref <0.031)

## 2015-03-10 LAB — BRAIN NATRIURETIC PEPTIDE: B NATRIURETIC PEPTIDE 5: 164.2 pg/mL — AB (ref 0.0–100.0)

## 2015-03-10 LAB — HIV ANTIBODY (ROUTINE TESTING W REFLEX): HIV SCREEN 4TH GENERATION: NONREACTIVE

## 2015-03-10 LAB — HEPATITIS B SURFACE ANTIGEN: Hepatitis B Surface Ag: NEGATIVE

## 2015-03-10 SURGERY — LEFT HEART CATH AND CORONARY ANGIOGRAPHY

## 2015-03-10 MED ORDER — ZOLPIDEM TARTRATE 5 MG PO TABS: 5.0000 mg | ORAL_TABLET | Freq: Every evening | ORAL | Status: DC | PRN

## 2015-03-10 MED ORDER — MIDAZOLAM HCL 2 MG/2ML IJ SOLN
INTRAMUSCULAR | Status: AC
  Filled 2015-03-10: qty 2

## 2015-03-10 MED ORDER — ACETAMINOPHEN 325 MG PO TABS: 650.0000 mg | ORAL_TABLET | ORAL | Status: DC | PRN

## 2015-03-10 MED ORDER — HEPARIN SODIUM (PORCINE) 1000 UNIT/ML IJ SOLN
INTRAMUSCULAR | Status: DC | PRN
  Administered 2015-03-10: 4000 [IU] via INTRAVENOUS

## 2015-03-10 MED ORDER — FENTANYL CITRATE (PF) 100 MCG/2ML IJ SOLN
INTRAMUSCULAR | Status: AC
  Filled 2015-03-10: qty 2

## 2015-03-10 MED ORDER — INSULIN ASPART 100 UNIT/ML ~~LOC~~ SOLN: 0.0000 [IU] | Freq: Three times a day (TID) | SUBCUTANEOUS | Status: DC

## 2015-03-10 MED ORDER — INSULIN GLARGINE 100 UNIT/ML ~~LOC~~ SOLN
15.0000 [IU] | Freq: Every evening | SUBCUTANEOUS | Status: DC
  Filled 2015-03-10: qty 0.15

## 2015-03-10 MED ORDER — SODIUM CHLORIDE 0.9 % IV SOLN
INTRAVENOUS | Status: DC
  Administered 2015-03-10: 04:00:00 via INTRAVENOUS

## 2015-03-10 MED ORDER — MIDAZOLAM HCL 2 MG/2ML IJ SOLN
INTRAMUSCULAR | Status: DC | PRN
  Administered 2015-03-10 (×2): 2 mg via INTRAVENOUS
  Administered 2015-03-10: 1 mg via INTRAVENOUS

## 2015-03-10 MED ORDER — HEPARIN BOLUS VIA INFUSION
1000.0000 [IU] | Freq: Once | INTRAVENOUS | Status: DC
Start: 2015-03-10 — End: 2015-03-10
  Filled 2015-03-10: qty 1000

## 2015-03-10 MED ORDER — TICAGRELOR 90 MG PO TABS
90.0000 mg | ORAL_TABLET | Freq: Two times a day (BID) | ORAL | Status: DC
  Administered 2015-03-11: 06:00:00 90 mg via ORAL
  Filled 2015-03-10: qty 1

## 2015-03-10 MED ORDER — VERAPAMIL HCL 2.5 MG/ML IV SOLN
INTRAVENOUS | Status: DC | PRN
  Administered 2015-03-10: 18:00:00 via INTRA_ARTERIAL

## 2015-03-10 MED ORDER — SODIUM CHLORIDE 0.9 % IJ SOLN
3.0000 mL | Freq: Two times a day (BID) | INTRAMUSCULAR | Status: DC
  Administered 2015-03-10: 3 mL via INTRAVENOUS

## 2015-03-10 MED ORDER — HEPARIN BOLUS VIA INFUSION
2500.0000 [IU] | Freq: Once | INTRAVENOUS | Status: AC
  Administered 2015-03-10: 2500 [IU] via INTRAVENOUS
  Filled 2015-03-10: qty 2500

## 2015-03-10 MED ORDER — IOHEXOL 350 MG/ML SOLN
INTRAVENOUS | Status: DC | PRN
  Administered 2015-03-10: 60 mL via INTRACARDIAC

## 2015-03-10 MED ORDER — ASPIRIN 325 MG PO TABS
325.0000 mg | ORAL_TABLET | Freq: Every day | ORAL | Status: DC
  Administered 2015-03-10: 325 mg via ORAL
  Filled 2015-03-10: qty 1

## 2015-03-10 MED ORDER — TICAGRELOR 90 MG PO TABS
ORAL_TABLET | ORAL | Status: DC | PRN
  Administered 2015-03-10: 180 mg via ORAL

## 2015-03-10 MED ORDER — SODIUM CHLORIDE 0.9 % IV SOLN
INTRAVENOUS | Status: DC | PRN
  Administered 2015-03-10: 200 mL/h via INTRAVENOUS

## 2015-03-10 MED ORDER — ONDANSETRON HCL 4 MG/2ML IJ SOLN: 4.0000 mg | Freq: Four times a day (QID) | INTRAMUSCULAR | Status: DC | PRN

## 2015-03-10 MED ORDER — BIVALIRUDIN BOLUS VIA INFUSION - CUPID
INTRAVENOUS | Status: DC | PRN
  Administered 2015-03-10: 57.825 mg via INTRAVENOUS

## 2015-03-10 MED ORDER — NITROGLYCERIN 1 MG/10 ML FOR IR/CATH LAB
INTRA_ARTERIAL | Status: AC
  Filled 2015-03-10: qty 10

## 2015-03-10 MED ORDER — ALUM & MAG HYDROXIDE-SIMETH 200-200-20 MG/5ML PO SUSP: 30.0000 mL | Freq: Four times a day (QID) | ORAL | Status: DC | PRN

## 2015-03-10 MED ORDER — BIVALIRUDIN 250 MG IV SOLR
250.0000 mg | INTRAVENOUS | Status: DC | PRN
  Administered 2015-03-10: 1.75 mg/kg/h via INTRAVENOUS

## 2015-03-10 MED ORDER — HYDROCODONE-ACETAMINOPHEN 10-325 MG PO TABS: 1.0000 | ORAL_TABLET | Freq: Four times a day (QID) | ORAL | Status: DC | PRN

## 2015-03-10 MED ORDER — FENTANYL CITRATE (PF) 100 MCG/2ML IJ SOLN
INTRAMUSCULAR | Status: DC | PRN
  Administered 2015-03-10 (×3): 25 ug via INTRAVENOUS

## 2015-03-10 MED ORDER — LIDOCAINE HCL (PF) 1 % IJ SOLN
INTRAMUSCULAR | Status: AC
Start: 2015-03-10 — End: 2015-03-10
  Filled 2015-03-10: qty 30

## 2015-03-10 MED ORDER — BIVALIRUDIN 250 MG IV SOLR
INTRAVENOUS | Status: AC
  Filled 2015-03-10: qty 250

## 2015-03-10 MED ORDER — VERAPAMIL HCL 2.5 MG/ML IV SOLN
INTRAVENOUS | Status: AC
  Filled 2015-03-10: qty 2

## 2015-03-10 MED ORDER — SODIUM CHLORIDE 0.9 % IV SOLN: 250.0000 mL | INTRAVENOUS | Status: DC | PRN

## 2015-03-10 MED ORDER — QUETIAPINE FUMARATE 200 MG PO TABS
200.0000 mg | ORAL_TABLET | Freq: Every day | ORAL | Status: DC
  Administered 2015-03-10 (×2): 600 mg via ORAL
  Filled 2015-03-10 (×4): qty 3

## 2015-03-10 MED ORDER — BUDESONIDE-FORMOTEROL FUMARATE 160-4.5 MCG/ACT IN AERO
2.0000 | INHALATION_SPRAY | Freq: Two times a day (BID) | RESPIRATORY_TRACT | Status: DC
  Administered 2015-03-10 – 2015-03-11 (×2): 2 via RESPIRATORY_TRACT
  Filled 2015-03-10 (×2): qty 6

## 2015-03-10 MED ORDER — MONTELUKAST SODIUM 10 MG PO TABS: 10.0000 mg | ORAL_TABLET | Freq: Once | ORAL | Status: DC

## 2015-03-10 MED ORDER — ASPIRIN 81 MG PO CHEW: 81.0000 mg | CHEWABLE_TABLET | Freq: Every day | ORAL | Status: DC

## 2015-03-10 MED ORDER — NICOTINE 14 MG/24HR TD PT24
14.0000 mg | MEDICATED_PATCH | Freq: Every day | TRANSDERMAL | Status: DC
  Administered 2015-03-10: 14 mg via TRANSDERMAL
  Filled 2015-03-10 (×2): qty 1

## 2015-03-10 MED ORDER — SODIUM CHLORIDE 0.9 % IV SOLN
INTRAVENOUS | Status: DC
  Administered 2015-03-10: 10:00:00 via INTRAVENOUS

## 2015-03-10 MED ORDER — HEPARIN BOLUS VIA INFUSION
4000.0000 [IU] | Freq: Once | INTRAVENOUS | Status: AC
  Administered 2015-03-10: 4000 [IU] via INTRAVENOUS
  Filled 2015-03-10: qty 4000

## 2015-03-10 MED ORDER — ASPIRIN 81 MG PO CHEW
81.0000 mg | CHEWABLE_TABLET | ORAL | Status: DC
Start: 2015-03-10 — End: 2015-03-10

## 2015-03-10 MED ORDER — SODIUM CHLORIDE 0.9 % IJ SOLN: 3.0000 mL | INTRAMUSCULAR | Status: DC | PRN

## 2015-03-10 MED ORDER — ATORVASTATIN CALCIUM 80 MG PO TABS
80.0000 mg | ORAL_TABLET | Freq: Every day | ORAL | Status: DC
  Administered 2015-03-10: 22:00:00 80 mg via ORAL
  Filled 2015-03-10 (×2): qty 1

## 2015-03-10 MED ORDER — HEPARIN (PORCINE) IN NACL 2-0.9 UNIT/ML-% IJ SOLN
INTRAMUSCULAR | Status: AC
  Filled 2015-03-10: qty 1000

## 2015-03-10 MED ORDER — SODIUM CHLORIDE 0.9 % IV SOLN
INTRAVENOUS | Status: DC
  Administered 2015-03-10: 100 mL/h via INTRAVENOUS

## 2015-03-10 MED ORDER — NITROGLYCERIN 0.2 MG/ML ON CALL CATH LAB
INTRAVENOUS | Status: DC | PRN
  Administered 2015-03-10 (×2): 100 ug via INTRACORONARY

## 2015-03-10 MED ORDER — HEPARIN SODIUM (PORCINE) 1000 UNIT/ML IJ SOLN
INTRAMUSCULAR | Status: AC
  Filled 2015-03-10: qty 1

## 2015-03-10 MED ORDER — HEPARIN (PORCINE) IN NACL 100-0.45 UNIT/ML-% IJ SOLN
1300.0000 [IU]/h | INTRAMUSCULAR | Status: DC
  Administered 2015-03-10: 900 [IU]/h via INTRAVENOUS
  Filled 2015-03-10 (×2): qty 250

## 2015-03-10 MED ORDER — METOPROLOL TARTRATE 12.5 MG HALF TABLET
12.5000 mg | ORAL_TABLET | Freq: Two times a day (BID) | ORAL | Status: DC
  Administered 2015-03-10 (×2): 12.5 mg via ORAL
  Filled 2015-03-10 (×2): qty 1

## 2015-03-10 MED ORDER — IPRATROPIUM-ALBUTEROL 0.5-2.5 (3) MG/3ML IN SOLN: 3.0000 mL | Freq: Four times a day (QID) | RESPIRATORY_TRACT | Status: DC | PRN

## 2015-03-10 MED ORDER — ALBUTEROL SULFATE HFA 108 (90 BASE) MCG/ACT IN AERS
1.0000 | INHALATION_SPRAY | RESPIRATORY_TRACT | Status: DC | PRN
  Administered 2015-03-10: 2 via RESPIRATORY_TRACT
  Filled 2015-03-10: qty 6.7

## 2015-03-10 MED ORDER — VARENICLINE TARTRATE 1 MG PO TABS
1.0000 mg | ORAL_TABLET | Freq: Two times a day (BID) | ORAL | Status: DC
  Filled 2015-03-10 (×2): qty 1

## 2015-03-10 MED ORDER — SODIUM CHLORIDE 0.9 % IJ SOLN: 3.0000 mL | Freq: Two times a day (BID) | INTRAMUSCULAR | Status: DC

## 2015-03-10 MED ORDER — HEPARIN SODIUM (PORCINE) 5000 UNIT/ML IJ SOLN
5000.0000 [IU] | Freq: Three times a day (TID) | INTRAMUSCULAR | Status: DC
  Administered 2015-03-11: 5000 [IU] via SUBCUTANEOUS
  Filled 2015-03-10: qty 1

## 2015-03-10 MED ORDER — ALBUTEROL SULFATE (2.5 MG/3ML) 0.083% IN NEBU
2.5000 mg | INHALATION_SOLUTION | RESPIRATORY_TRACT | Status: DC | PRN
Start: 2015-03-10 — End: 2015-03-11

## 2015-03-10 MED ORDER — MORPHINE SULFATE 2 MG/ML IJ SOLN: 2.0000 mg | INTRAMUSCULAR | Status: DC | PRN

## 2015-03-10 MED ORDER — SODIUM CHLORIDE 0.9 % IV SOLN: INTRAVENOUS | Status: AC

## 2015-03-10 MED ORDER — MONTELUKAST SODIUM 10 MG PO TABS
10.0000 mg | ORAL_TABLET | Freq: Every day | ORAL | Status: DC
  Filled 2015-03-10 (×3): qty 1

## 2015-03-10 SURGICAL SUPPLY — 24 items
BALLN EMERGE MR 2.5X15 (BALLOONS) ×2
BALLN ~~LOC~~ EUPHORA RX 3.75X15 (BALLOONS) ×2
BALLOON EMERGE MR 2.5X15 (BALLOONS) ×1 IMPLANT
BALLOON ~~LOC~~ EUPHORA RX 3.75X15 (BALLOONS) ×1 IMPLANT
CATH INFINITI 5 FR JL3.5 (CATHETERS) ×2 IMPLANT
CATH INFINITI 5FR AL1 (CATHETERS) ×2 IMPLANT
CATH INFINITI 5FR ANG PIGTAIL (CATHETERS) ×2 IMPLANT
CATH INFINITI 5FR MULTPACK ANG (CATHETERS) IMPLANT
CATH INFINITI JR4 5F (CATHETERS) ×2 IMPLANT
CATH VISTA GUIDE 6FR XBLAD3.5 (CATHETERS) ×2 IMPLANT
DEVICE RAD COMP TR BAND LRG (VASCULAR PRODUCTS) ×2 IMPLANT
GLIDESHEATH SLEND SS 6F .021 (SHEATH) ×2 IMPLANT
KIT ENCORE 26 ADVANTAGE (KITS) ×2 IMPLANT
KIT HEART LEFT (KITS) ×2 IMPLANT
PACK CARDIAC CATHETERIZATION (CUSTOM PROCEDURE TRAY) ×2 IMPLANT
SHEATH PINNACLE 5F 10CM (SHEATH) IMPLANT
STENT VISION RX 3.5X18 (Permanent Stent) ×2 IMPLANT
SYR MEDRAD MARK V 150ML (SYRINGE) ×2 IMPLANT
TRANSDUCER W/STOPCOCK (MISCELLANEOUS) ×2 IMPLANT
TUBING CIL FLEX 10 FLL-RA (TUBING) ×2 IMPLANT
WIRE COUGAR XT STRL 190CM (WIRE) ×2 IMPLANT
WIRE EMERALD 3MM-J .035X150CM (WIRE) IMPLANT
WIRE HI TORQ VERSACORE-J 145CM (WIRE) ×2 IMPLANT
WIRE SAFE-T 1.5MM-J .035X260CM (WIRE) ×2 IMPLANT

## 2015-03-10 NOTE — ED Notes (Signed)
11:30AM APPOINTMENT TIME.

## 2015-03-10 NOTE — Consult Note (Signed)
Referring Physician: Dr. Roxanne Mins Primary Physician: Primary Cardiologist: Reason for Consultation: CP with elevated troponin   HPI: Mr Chase Gibson is a 59 yo man with DM, HLD, tobacco use and CKD stage III-IV since around 09/2014 who presents with chest pain.  He reports having the first episode Monday morning.  Described as a tight feeling localized to left upper chest.  He thought it may be indigestion related and went to [facility] where he purchased some antacids which provided relief.  Episode lasted about 1 hour.  He had another similar episode Monday night but only lasted about 30 minutes.  He describes associated left arm discomfort but has difficulty describing it any further.  Tuesday evening he had another episode that was more intense and did not resolve prompting call to EMS.  Pain did  Resolve and recurred with EMS and resolved immediately with NTG x 1.  He is currently symptom free and quite anxious about not staying because of planned knee surgery on this upcoming Friday.   Evaluation in ED notable for ECG with anterior TWI, mildly elevated troponin and Cr 2.84 (GFR 23).   Review of Systems:     Cardiac Review of Systems: {Y] = yes [ ]  = no  Chest Pain [x    ]  Resting SOB [   ] Exertional SOB  [  ]  Orthopnea [  ]   Pedal Edema [   ]    Palpitations [  ] Syncope  [  ]   Presyncope [   ]  General Review of Systems: [Y] = yes [  ]=no Constitional: recent weight change [  ]; anorexia [  ]; fatigue [  ]; nausea [ x ]; night sweats [  ]; fever [  ]; or chills [  ];                                                                      Eyes : blurred vision [  ]; diplopia [   ]; vision changes [  ];  Amaurosis fugax[  ]; Resp: cough [  ];  wheezing[  ];  hemoptysis[  ];  PND [  ];  GI:  gallstones[  ], vomiting[  ];  dysphagia[  ]; melena[  ];  hematochezia [  ]; heartburn[  ];   GU: kidney stones [  ]; hematuria[  ];   dysuria [  ];  nocturia[  ]; incontinence [  ];             Skin:  rash, swelling[  ];, hair loss[  ];  peripheral edema[  ];  or itching[  ]; Musculosketetal: myalgias[  ];  joint swelling[x  ];  joint erythema[  ];  joint pain[ x ];  back pain[  ];  Heme/Lymph: bruising[  ];  bleeding[  ];  anemia[  ];  Neuro: TIA[  ];  headaches[  ];  stroke[  ];  vertigo[  ];  seizures[  ];   paresthesias[  ];  difficulty walking[  ];  Psych:depression[  ]; anxiety[  ];  Endocrine: diabetes[  ];  thyroid dysfunction[  ];  Other:  Past Medical History  Diagnosis Date  . ALLERGIC RHINITIS 03/18/2009  .  ASTHMA 06/24/2007  . BREAST PAIN, LEFT 07/21/2008  . DEPRESSION 06/24/2007  . DIABETES MELLITUS, TYPE II 07/21/2008  . HYPERLIPIDEMIA 07/21/2008  . INSOMNIA-SLEEP DISORDER-UNSPEC 03/28/2010  . LOW BACK PAIN 09/03/2007  . NAUSEA 06/14/2010  . OSTEOARTHRITIS, KNEES, BILATERAL 08/25/2008  . PERIPHERAL EDEMA 07/21/2008  . SYMPTOM, ABNORMAL INVOLUNTARY MOVEMENT NEC 06/21/2007  . Wheezing 07/13/2009  . COPD (chronic obstructive pulmonary disease) 03/28/2012  . Colon polyps 03/28/2012  . GERD 08/25/2008    pt denies at PAT visit on 02/25/15  . ANXIETY 06/24/2007    pt denies at PAT visit on 02/25/15  . Vertigo 11/2014    pt has a bout every 3-4 months, no pattern     (Not in a hospital admission)   . atorvastatin  80 mg Oral q1800  . metoprolol tartrate  12.5 mg Oral BID    Infusions: . sodium chloride    . heparin 900 Units/hr (03/10/15 0025)  . nitroGLYCERIN 10 mcg/min (03/10/15 0005)    Allergies  Allergen Reactions  . Tramadol Itching  . Nsaids Nausea And Vomiting and Rash    History   Social History  . Marital Status: Divorced    Spouse Name: N/A  . Number of Children: 2  . Years of Education: N/A   Occupational History  . Disabled    Social History Main Topics  . Smoking status: Current Every Day Smoker -- 1.00 packs/day for 35 years    Types: Cigarettes    Last Attempt to Quit: 12/02/2012  . Smokeless tobacco: Never Used  . Alcohol Use: No  . Drug Use: No   . Sexual Activity: Not on file   Other Topics Concern  . Not on file   Social History Narrative    Family History  Problem Relation Age of Onset  . Coronary artery disease Other   . Emphysema Mother     never smoker but spouse smoked  . Allergies Son   . Asthma Maternal Grandmother   . Rheum arthritis Sister     PHYSICAL EXAM: Filed Vitals:   03/10/15 0115  BP: 109/61  Pulse: 80  Temp:   Resp: 19    No intake or output data in the 24 hours ending 03/10/15 0134  General:  Well appearing. No respiratory difficulty HEENT: [place], AT, PERRL, EOMI, anicteric Neck: supple. no JVD. Carotids 2+ bilat; no bruits. No lymphadenopathy or thryomegaly appreciated. Cor: PMI nondisplaced. Regular rate & rhythm. No rubs, gallops or murmurs.  Radial pulse 2+ Lungs: clear Abdomen: soft, nontender, nondistended. No hepatosplenomegaly. No bruits or masses. Good bowel sounds. Extremities: no cyanosis, clubbing, rash, edema, right leg in brace. Neuro: alert & oriented x 3, cranial nerves grossly intact. moves all 4 extremities w/o difficulty. Affect pleasant but anxious.  ECG: SR with anterior and lateral TWI concerning for ischemia  Results for orders placed or performed during the hospital encounter of 03/09/15 (from the past 24 hour(s))  CBC     Status: Abnormal   Collection Time: 03/09/15 11:19 PM  Result Value Ref Range   WBC 5.7 4.0 - 10.5 K/uL   RBC 3.20 (L) 4.22 - 5.81 MIL/uL   Hemoglobin 9.9 (L) 13.0 - 17.0 g/dL   HCT 29.6 (L) 39.0 - 52.0 %   MCV 92.5 78.0 - 100.0 fL   MCH 30.9 26.0 - 34.0 pg   MCHC 33.4 30.0 - 36.0 g/dL   RDW 15.3 11.5 - 15.5 %   Platelets 153 150 - 400 K/uL  Comprehensive metabolic  panel     Status: Abnormal   Collection Time: 03/09/15 11:19 PM  Result Value Ref Range   Sodium 138 135 - 145 mmol/L   Potassium 5.0 3.5 - 5.1 mmol/L   Chloride 105 101 - 111 mmol/L   CO2 23 22 - 32 mmol/L   Glucose, Bld 136 (H) 65 - 99 mg/dL   BUN 27 (H) 6 - 20 mg/dL    Creatinine, Ser 2.84 (H) 0.61 - 1.24 mg/dL   Calcium 10.3 8.9 - 10.3 mg/dL   Total Protein 6.7 6.5 - 8.1 g/dL   Albumin 3.5 3.5 - 5.0 g/dL   AST 16 15 - 41 U/L   ALT 10 (L) 17 - 63 U/L   Alkaline Phosphatase 130 (H) 38 - 126 U/L   Total Bilirubin 0.3 0.3 - 1.2 mg/dL   GFR calc non Af Amer 23 (L) >60 mL/min   GFR calc Af Amer 27 (L) >60 mL/min   Anion gap 10 5 - 15  I-stat troponin, ED (not at [facility], [facility])     Status: Abnormal   Collection Time: 03/09/15 11:27 PM  Result Value Ref Range   Troponin i, poc 0.26 (HH) 0.00 - 0.08 ng/mL   Comment NOTIFIED PHYSICIAN    Comment 3           Dg Chest 2 View  03/09/2015   CLINICAL DATA:  Acute onset of chest pain.  Initial encounter.  EXAM: CHEST  2 VIEW  COMPARISON:  Chest radiograph performed 07/07/2013  FINDINGS: The lungs are well-aerated. Pulmonary vascularity is at the upper limits of normal. Density near the lung bases is thought to reflect overlying soft tissues and possibly gynecomastia, better seen than on prior studies. No mass is seen on the lateral view. There is no evidence of focal opacification, pleural effusion or pneumothorax.  The heart is normal in size; the mediastinal contour is within normal limits. No acute osseous abnormalities are seen. The patient is status post right-sided rotator cuff repair.  IMPRESSION: No acute cardiopulmonary process seen.   Electronically Signed   By: Garald Balding M.D.   On: 03/09/2015 23:36     ASSESSMENT: 59 yo man with DM, HLD, tobacco use and CKD stage III-IV since around 09/2014 who presents with chest pain, new TWI anterior and lateral leads concerning for ischemia and mildly elevated troponin consistent with ACS/NSTEMI.  PLAN/DISCUSSION: Aspirin and heparin drip Start low dose beta blocker such as metoprolol Recommend switching to high dose, high potency statin (may need to consider CrCl for dosing) Risk stratify with A1c and lipids and risk factor modification including tobacco  cessation Echocardiogram in AM Consider gentle hydration given CrCl and possible contrast load tomorrow NPO after MN for possible cath, will need to use contrast sparing methods

## 2015-03-10 NOTE — Consult Note (Signed)
Reason for Consult:CKD 3-4 Referring Physician: [name] is an 59 y.o. male.  HPI: 59 yr male with about 12yrof DM.  Multiple surgies TKR, hernia, rotator cuffs, appy.  Hx smoking, asthma, GERD.  Starting 6/15 had trouble with R TKR ending up infx, removal of knee, AB spacer, prolonged IV and po AB.  In early 12.15, Cr was 1.1  But on 12/25 presented with Cr 2.45  And since has varied 2.25-2.86 today.  Usually around 2.5.  Denies any prob and told it was stable.  No urinary sx except freq nocturia.  No hx stones, ACEI, recent NSAIDS.  (claims allergy to those).  Was on Vanco when Cr rose and switched to Dapto.  Was on high dose Septra prior to that.  Recently on 2 AB which he does not know.  No FH of renal dz ,no hx of stones or UTIs. Constitutional: negative Eyes: glasses/contacts Ears, nose, mouth, throat, and face: negative Respiratory: asthma,occ wheezing Cardiovascular: see cards note,, new onset angina Gastrointestinal: negative Genitourinary:noct q 2h Integument/breast: negative Hematologic/lymphatic: anemic,does not know Musculoskeletal:R knee,hx shoulders Neurological: negative Endocrine: HBA1C under control. recently less meds Allergic/Immunologic: Tramadol, NSAIDs   Past Medical History  Diagnosis Date  . ALLERGIC RHINITIS 03/18/2009  . ASTHMA 06/24/2007  . BREAST PAIN, LEFT 07/21/2008  . DEPRESSION 06/24/2007  . DIABETES MELLITUS, TYPE II 07/21/2008  . HYPERLIPIDEMIA 07/21/2008  . INSOMNIA-SLEEP DISORDER-UNSPEC 03/28/2010  . LOW BACK PAIN 09/03/2007  . NAUSEA 06/14/2010  . OSTEOARTHRITIS, KNEES, BILATERAL 08/25/2008  . PERIPHERAL EDEMA 07/21/2008  . SYMPTOM, ABNORMAL INVOLUNTARY MOVEMENT NEC 06/21/2007  . Wheezing 07/13/2009  . COPD (chronic obstructive pulmonary disease) 03/28/2012  . Colon polyps 03/28/2012  . GERD 08/25/2008    pt denies at PAT visit on 02/25/15  . ANXIETY 06/24/2007    pt denies at PAT visit on 02/25/15  . Vertigo 11/2014    pt has a bout every  3-4 months, no pattern  . CKD (chronic kidney disease), stage III     Past Surgical History  Procedure Laterality Date  . Right knee surgury      x 9  . Rotator cuff repair Bilateral     bilateral  . Inguinal herniorrhapy Bilateral     1979 & 1997  . Appendectomy    . Cholecystectomy    . Tonsillectomy    . Total knee arthroplasty Right     right  . Knee arthroscopy Left     left    Family History  Problem Relation Age of Onset  . Coronary artery disease Other   . Emphysema Mother     never smoker but spouse smoked  . Allergies Son   . Asthma Maternal Grandmother   . Rheum arthritis Sister     Social History:  reports that he has been smoking Cigarettes.  He has a 35 pack-year smoking history. He has never used smokeless tobacco. He reports that he does not drink alcohol or use illicit drugs.  Allergies:  Allergies  Allergen Reactions  . Tramadol Itching  . Nsaids Nausea And Vomiting and Rash    Medications:  I have reviewed the patient's current medications. Prior to Admission:  Prescriptions prior to admission  Medication Sig Dispense Refill Last Dose  . albuterol (VENTOLIN HFA) 108 (90 BASE) MCG/ACT inhaler Inhale 1-2 puffs into the lungs every 4 (four) hours as needed for wheezing or shortness of breath.    03/09/2015 at Unknown time  . aspirin EC  325 MG tablet Take 325 mg by mouth daily.   Past Month at Unknown time  . HYDROcodone-acetaminophen (NORCO) 10-325 MG per tablet Take 1 tablet by mouth every 6 (six) hours as needed for moderate pain.   03/09/2015 at Unknown time  . ipratropium-albuterol (DUONEB) 0.5-2.5 (3) MG/3ML SOLN Take 3 mLs by nebulization every 6 (six) hours as needed (shortness of breath).   03/09/2015 at Unknown time  . montelukast (SINGULAIR) 10 MG tablet Take 10 mg by mouth once.    03/09/2015 at Unknown time  . QUEtiapine (SEROQUEL) 200 MG tablet Take 200-600 mg by mouth at bedtime.   03/08/2015  . simvastatin (ZOCOR) 10 MG tablet Take 20 mg by  mouth daily.    03/09/2015 at Unknown time  . sitaGLIPtin (JANUVIA) 50 MG tablet Take 50 mg by mouth daily.   03/08/2015  . varenicline (CHANTIX) 1 MG tablet Take 1 mg by mouth 2 (two) times daily.   03/08/2015  . budesonide-formoterol (SYMBICORT) 160-4.5 MCG/ACT inhaler Take 2 puffs first thing in am and then another 2 puffs about 12 hours later. (Patient not taking: Reported on 10/16/2014) 1 Inhaler 12 Not Taking at Unknown time  . oxyCODONE (OXY IR/ROXICODONE) 5 MG immediate release tablet Take 10 mg by mouth every 4 (four) hours as needed for severe pain.   10/16/2014 at 1800    Results for orders placed or performed during the hospital encounter of 03/09/15 (from the past 48 hour(s))  CBC     Status: Abnormal   Collection Time: 03/09/15 11:19 PM  Result Value Ref Range   WBC 5.7 4.0 - 10.5 K/uL   RBC 3.20 (L) 4.22 - 5.81 MIL/uL   Hemoglobin 9.9 (L) 13.0 - 17.0 g/dL   HCT 29.6 (L) 39.0 - 52.0 %   MCV 92.5 78.0 - 100.0 fL   MCH 30.9 26.0 - 34.0 pg   MCHC 33.4 30.0 - 36.0 g/dL   RDW 15.3 11.5 - 15.5 %   Platelets 153 150 - 400 K/uL  Comprehensive metabolic panel     Status: Abnormal   Collection Time: 03/09/15 11:19 PM  Result Value Ref Range   Sodium 138 135 - 145 mmol/L   Potassium 5.0 3.5 - 5.1 mmol/L   Chloride 105 101 - 111 mmol/L   CO2 23 22 - 32 mmol/L   Glucose, Bld 136 (H) 65 - 99 mg/dL   BUN 27 (H) 6 - 20 mg/dL   Creatinine, Ser 2.84 (H) 0.61 - 1.24 mg/dL   Calcium 10.3 8.9 - 10.3 mg/dL   Total Protein 6.7 6.5 - 8.1 g/dL   Albumin 3.5 3.5 - 5.0 g/dL   AST 16 15 - 41 U/L   ALT 10 (L) 17 - 63 U/L   Alkaline Phosphatase 130 (H) 38 - 126 U/L   Total Bilirubin 0.3 0.3 - 1.2 mg/dL   GFR calc non Af Amer 23 (L) >60 mL/min   GFR calc Af Amer 27 (L) >60 mL/min    Comment: (NOTE) The eGFR has been calculated using the CKD EPI equation. This calculation has not been validated in all clinical situations. eGFR's persistently <60 mL/min signify possible Chronic Kidney Disease.     Anion gap 10 5 - 15  I-stat troponin, ED (not at [facility], [facility])     Status: Abnormal   Collection Time: 03/09/15 11:27 PM  Result Value Ref Range   Troponin i, poc 0.26 (HH) 0.00 - 0.08 ng/mL   Comment NOTIFIED PHYSICIAN  Comment 3            Comment: Due to the release kinetics of cTnI, a negative result within the first hours of the onset of symptoms does not rule out myocardial infarction with certainty. If myocardial infarction is still suspected, repeat the test at appropriate intervals.   Troponin I (q 6hr x 3)     Status: Abnormal   Collection Time: 03/10/15  1:57 AM  Result Value Ref Range   Troponin I 0.43 (H) <0.031 ng/mL    Comment:        PERSISTENTLY INCREASED TROPONIN VALUES IN THE RANGE OF 0.04-0.49 ng/mL CAN BE SEEN IN:       -UNSTABLE ANGINA       -CONGESTIVE HEART FAILURE       -MYOCARDITIS       -CHEST TRAUMA       -ARRYHTHMIAS       -LATE PRESENTING MYOCARDIAL INFARCTION       -COPD   CLINICAL FOLLOW-UP RECOMMENDED.   Protime-INR     Status: None   Collection Time: 03/10/15  1:57 AM  Result Value Ref Range   Prothrombin Time 14.4 11.6 - 15.2 seconds   INR 1.11 0.00 - 1.49  APTT     Status: Abnormal   Collection Time: 03/10/15  1:57 AM  Result Value Ref Range   aPTT 88 (H) 24 - 37 seconds    Comment:        IF BASELINE aPTT IS ELEVATED, SUGGEST PATIENT RISK ASSESSMENT BE USED TO DETERMINE APPROPRIATE ANTICOAGULANT THERAPY.   Brain natriuretic peptide     Status: Abnormal   Collection Time: 03/10/15  3:22 AM  Result Value Ref Range   B Natriuretic Peptide 164.2 (H) 0.0 - 100.0 pg/mL  Comprehensive metabolic panel     Status: Abnormal   Collection Time: 03/10/15  3:23 AM  Result Value Ref Range   Sodium 139 135 - 145 mmol/L   Potassium 5.0 3.5 - 5.1 mmol/L   Chloride 106 101 - 111 mmol/L   CO2 25 22 - 32 mmol/L   Glucose, Bld 109 (H) 65 - 99 mg/dL   BUN 26 (H) 6 - 20 mg/dL   Creatinine, Ser 2.86 (H) 0.61 - 1.24 mg/dL   Calcium 11.1 (H) 8.9  - 10.3 mg/dL   Total Protein 7.1 6.5 - 8.1 g/dL   Albumin 3.5 3.5 - 5.0 g/dL   AST 16 15 - 41 U/L   ALT 11 (L) 17 - 63 U/L   Alkaline Phosphatase 127 (H) 38 - 126 U/L   Total Bilirubin 0.4 0.3 - 1.2 mg/dL   GFR calc non Af Amer 23 (L) >60 mL/min   GFR calc Af Amer 27 (L) >60 mL/min    Comment: (NOTE) The eGFR has been calculated using the CKD EPI equation. This calculation has not been validated in all clinical situations. eGFR's persistently <60 mL/min signify possible Chronic Kidney Disease.    Anion gap 8 5 - 15  CBC     Status: Abnormal   Collection Time: 03/10/15  3:23 AM  Result Value Ref Range   WBC 6.4 4.0 - 10.5 K/uL   RBC 3.28 (L) 4.22 - 5.81 MIL/uL   Hemoglobin 10.1 (L) 13.0 - 17.0 g/dL   HCT 30.6 (L) 39.0 - 52.0 %   MCV 93.3 78.0 - 100.0 fL   MCH 30.8 26.0 - 34.0 pg   MCHC 33.0 30.0 - 36.0 g/dL   RDW 15.4 11.5 -  15.5 %   Platelets 169 150 - 400 K/uL  Urine rapid drug screen (hosp performed)     Status: None   Collection Time: 03/10/15  5:09 AM  Result Value Ref Range   Opiates NONE DETECTED NONE DETECTED   Cocaine NONE DETECTED NONE DETECTED   Benzodiazepines NONE DETECTED NONE DETECTED   Amphetamines NONE DETECTED NONE DETECTED   Tetrahydrocannabinol NONE DETECTED NONE DETECTED   Barbiturates NONE DETECTED NONE DETECTED    Comment:        DRUG SCREEN FOR MEDICAL PURPOSES ONLY.  IF CONFIRMATION IS NEEDED FOR ANY PURPOSE, NOTIFY LAB WITHIN 5 DAYS.        LOWEST DETECTABLE LIMITS FOR URINE DRUG SCREEN Drug Class       Cutoff (ng/mL) Amphetamine      1000 Barbiturate      200 Benzodiazepine   078 Tricyclics       675 Opiates          300 Cocaine          300 THC              50   Creatinine, urine, random     Status: None   Collection Time: 03/10/15  5:09 AM  Result Value Ref Range   Creatinine, Urine 55.65 mg/dL  Sodium, urine, random     Status: None   Collection Time: 03/10/15  5:09 AM  Result Value Ref Range   Sodium, Ur 91 mmol/L  CBG  monitoring, ED     Status: Abnormal   Collection Time: 03/10/15  6:37 AM  Result Value Ref Range   Glucose-Capillary 106 (H) 65 - 99 mg/dL  Heparin level (unfractionated)     Status: Abnormal   Collection Time: 03/10/15  7:38 AM  Result Value Ref Range   Heparin Unfractionated 0.12 (L) 0.30 - 0.70 IU/mL    Comment:        IF HEPARIN RESULTS ARE BELOW EXPECTED VALUES, AND PATIENT DOSAGE HAS BEEN CONFIRMED, SUGGEST FOLLOW UP TESTING OF ANTITHROMBIN III LEVELS.   CBC     Status: Abnormal   Collection Time: 03/10/15  7:38 AM  Result Value Ref Range   WBC 5.2 4.0 - 10.5 K/uL   RBC 2.95 (L) 4.22 - 5.81 MIL/uL   Hemoglobin 9.2 (L) 13.0 - 17.0 g/dL   HCT 27.5 (L) 39.0 - 52.0 %   MCV 93.2 78.0 - 100.0 fL   MCH 31.2 26.0 - 34.0 pg   MCHC 33.5 30.0 - 36.0 g/dL   RDW 15.5 11.5 - 15.5 %   Platelets 144 (L) 150 - 400 K/uL  Troponin I (q 6hr x 3)     Status: Abnormal   Collection Time: 03/10/15  7:38 AM  Result Value Ref Range   Troponin I 0.39 (H) <0.031 ng/mL    Comment:        PERSISTENTLY INCREASED TROPONIN VALUES IN THE RANGE OF 0.04-0.49 ng/mL CAN BE SEEN IN:       -UNSTABLE ANGINA       -CONGESTIVE HEART FAILURE       -MYOCARDITIS       -CHEST TRAUMA       -ARRYHTHMIAS       -LATE PRESENTING MYOCARDIAL INFARCTION       -COPD   CLINICAL FOLLOW-UP RECOMMENDED.     Dg Chest 2 View  03/09/2015   CLINICAL DATA:  Acute onset of chest pain.  Initial encounter.  EXAM: CHEST  2 VIEW  COMPARISON:  Chest radiograph performed 07/07/2013  FINDINGS: The lungs are well-aerated. Pulmonary vascularity is at the upper limits of normal. Density near the lung bases is thought to reflect overlying soft tissues and possibly gynecomastia, better seen than on prior studies. No mass is seen on the lateral view. There is no evidence of focal opacification, pleural effusion or pneumothorax.  The heart is normal in size; the mediastinal contour is within normal limits. No acute osseous abnormalities are  seen. The patient is status post right-sided rotator cuff repair.  IMPRESSION: No acute cardiopulmonary process seen.   Electronically Signed   By: Garald Balding M.D.   On: 03/09/2015 23:36   US Renal  03/10/2015   CLINICAL DATA:  Acute kidney injury.  EXAM: RENAL / URINARY TRACT ULTRASOUND COMPLETE  COMPARISON:  None.  FINDINGS: Right Kidney:  Length: 10.7 cm. Echogenicity within normal limits. No mass or hydronephrosis visualized.  Left Kidney:  Length: 11.0 cm. Echogenicity within normal limits. No mass or hydronephrosis visualized.  Bladder:  Appears normal for degree of bladder distention.  IMPRESSION: Normal renal ultrasound.   Electronically Signed   By: Jeb Levering M.D.   On: 03/10/2015 06:29    ROS Blood pressure 105/67, pulse 77, temperature 97.8 F (36.6 C), temperature source Oral, resp. rate 18, height '5\' 10"'  (1.778 m), weight 77.11 kg (170 lb), SpO2 99 %. Physical Exam Physical Examination: General appearance - alert, well appearing, and in no distress and pale Mental status - alert, oriented to person, place, and time Eyes - pupils equal and reactive, extraocular eye movements intact, funduscopic exam normal, discs flat and sharp Mouth - edentulous Neck - adenopathy noted PCL Lymphatics - posterior cervical nodes Chest - wheezing noted bibasilar, decreased air entry noted bilat Heart - S1 and S2 normal, S4 present, systolic murmur Gr 2/6 at apex Abdomen - soft, nontender, nondistended, no masses or organomegaly Liver down 5 cm Extremities - Brace R Knee Skin - sallow,pale  Assessment/Plan: 1 CKD 3-4 mildly over baseline, little change GFR.  Hx most c/w toxic tubular injury from AB, most likely Vanco.  GFR persistently low.  Has anemia and need to check other complic and r/o reversibility.  Many implications this low GFR with meds, fluids,diet, etc 2 DM per primary 3 Hypertension: not an issue 4. Anemia will eval and may need to tx 5. Metabolic Bone Disease:needs  eval 6 CP for cath. Discussed risk , <15%, of injury and <1% dialysis P U/S, UA, SPEP, UPEP, PTH, , Uric acid.  ivf pre cath  Dot Splinter L 03/10/2015, 12:06 PM

## 2015-03-10 NOTE — Progress Notes (Signed)
Patient seen and examined this morning, admitted overnight by Dr. Blaine Hamper with chest pain, elevated troponin as well as EKG changes. Cardiology was consulted and patient will be having a cath per. On  My evaluation patient is chest pain free and wants to make sure that I know that he will be "going home tomorrow with or without MD approval" to "smoke a cigarette and eat".   - continue ACS tx - cath per cardiology - CKD, nephrology consulted - based on cath / stent needs may need to postpone his knee surgery    Tavi Gaughran M. Cruzita Lederer, MD [facility] [phone]

## 2015-03-10 NOTE — ED Notes (Signed)
Spoke with admitting MD concerning pt's BP, no need orders at this time as pt is not symptomatic. Continue to monitor patient.

## 2015-03-10 NOTE — ED Notes (Signed)
Admitting and cardiology at bedside

## 2015-03-10 NOTE — ED Notes (Signed)
Admitting MD paged 

## 2015-03-10 NOTE — ED Notes (Signed)
Spoke with admitting MD regarding pt's BP, informed nitroglycerin was stopped. Continue to monitor pt at this time, no new orders.

## 2015-03-10 NOTE — Progress Notes (Signed)
[place] for Heparin Indication: chest pain/ACS  Allergies  Allergen Reactions  . Tramadol Itching  . Nsaids Nausea And Vomiting and Rash    Patient Measurements: Height: 5\' 10"  (177.8 cm) Weight: 170 lb (77.11 kg) IBW/kg (Calculated) : 73  Vital Signs: Temp: 97.8 F (36.6 C) (05/18 1141) Temp Source: Oral (05/18 1141) BP: 146/59 mmHg (05/18 1405) Pulse Rate: 77 (05/18 1141)  Labs:  Recent Labs  03/09/15 2319 03/10/15 0157 03/10/15 0323 03/10/15 0738 03/10/15 1243 03/10/15 1415  HGB 9.9*  --  10.1* 9.2*  --   --   HCT 29.6*  --  30.6* 27.5*  --   --   PLT 153  --  169 144*  --   --   APTT  --  88*  --   --   --   --   LABPROT  --  14.4  --   --   --   --   INR  --  1.11  --   --   --   --   HEPARINUNFRC  --   --   --  0.12*  --  0.20*  CREATININE 2.84*  --  2.86*  --   --   --   TROPONINI  --  0.43*  --  0.39* 0.32*  --     Estimated Creatinine Clearance: 29.4 mL/min (by C-G formula based on Cr of 2.86).   Medical History: Past Medical History  Diagnosis Date  . ALLERGIC RHINITIS 03/18/2009  . ASTHMA 06/24/2007  . BREAST PAIN, LEFT 07/21/2008  . DEPRESSION 06/24/2007  . DIABETES MELLITUS, TYPE II 07/21/2008  . HYPERLIPIDEMIA 07/21/2008  . INSOMNIA-SLEEP DISORDER-UNSPEC 03/28/2010  . LOW BACK PAIN 09/03/2007  . NAUSEA 06/14/2010  . OSTEOARTHRITIS, KNEES, BILATERAL 08/25/2008  . PERIPHERAL EDEMA 07/21/2008  . SYMPTOM, ABNORMAL INVOLUNTARY MOVEMENT NEC 06/21/2007  . Wheezing 07/13/2009  . COPD (chronic obstructive pulmonary disease) 03/28/2012  . Colon polyps 03/28/2012  . GERD 08/25/2008    pt denies at PAT visit on 02/25/15  . ANXIETY 06/24/2007    pt denies at PAT visit on 02/25/15  . Vertigo 11/2014    pt has a bout every 3-4 months, no pattern  . CKD (chronic kidney disease), stage III     Medications:  Janumet  Albuterol  ASA  Norco  Lantus  Duoneb  Singulair  OxyIR  Seroquel  Zocor  Chantix  Assessment: 59 y.o. male with  chest pain for heparin  Heparin level remains subtherapeutic Goal of Therapy:  Heparin level 0.3-0.7 units/ml Monitor platelets by anticoagulation protocol: Yes   Plan:  Heparin 1000 units IV bolus, then increase heparin 1300 units/hr Check heparin level in 6 hours.   Excell Seltzer Poteet 03/10/2015,3:39 PM

## 2015-03-10 NOTE — Interval H&P Note (Signed)
History and Physical Interval Note:  03/10/2015 5:36 PM  Chase Gibson  has presented today for surgery, with the diagnosis of non stemi  The various methods of treatment have been discussed with the patient and family. After consideration of risks, benefits and other options for treatment, the patient has consented to  Procedure(s): Left Heart Cath and Coronary Angiography (N/A) as a surgical intervention .  The patient's history has been reviewed, patient examined, no change in status, stable for surgery.  I have reviewed the patient's chart and labs.  Questions were answered to the patient's satisfaction.    Cath Lab Visit (complete for each Cath Lab visit)  Clinical Evaluation Leading to the Procedure:   ACS: Yes.    Non-ACS:    Anginal Classification: CCS IV  Anti-ischemic medical therapy: Minimal Therapy (1 class of medications)  Non-Invasive Test Results: No non-invasive testing performed  Prior CABG: No previous CABG       Sherren Mocha

## 2015-03-10 NOTE — Progress Notes (Signed)
Patient: Chase Gibson / Admit Date: 03/09/2015 / Date of Encounter: 03/10/2015, 8:43 AM   Subjective: 59 y.o. Male with PMH of HLD, DM, COPD, 1 PPD tobacco abuse, and stage III kidney disease presenting with chest pain.   Overnight no acute issues. Chest pain free since 9PM last night. No dyspnea, palpitations, nausea, or vomiting. He is anxious and requesting food and his nicotine patch.    Objective: Telemetry: NSR Physical Exam: Blood pressure 108/73, pulse 69, temperature 97.7 F (36.5 C), temperature source Oral, resp. rate 15, height 5\' 10"  (1.778 m), weight 170 lb (77.111 kg), SpO2 98 %. General: Well developed, well nourished, in no acute distress. Head: Normocephalic, atraumatic, sclera non-icteric, no xanthomas, nares are without discharge. Neck: Negative for carotid bruits. JVP not elevated. Lungs: Clear bilaterally to auscultation without wheezes, rales, or rhonchi. Breathing is unlabored. Heart: RRR S1 S2 without murmurs, rubs, or gallops.  Abdomen: Soft, non-tender, non-distended with normoactive bowel sounds. No rebound/guarding. Extremities: No clubbing or cyanosis. No edema. Distal pedal pulses are 2+ and equal bilaterally. R knee immobilizer in-place.  Neuro: Alert and oriented X 3. Moves all extremities spontaneously. Psych:  Responds to questions appropriately with a normal affect.   Intake/Output Summary (Last 24 hours) at 03/10/15 0843 Last data filed at 03/10/15 0813  Gross per 24 hour  Intake      0 ml  Output   1180 ml  Net  -1180 ml    Inpatient Medications:  . aspirin  325 mg Oral Daily  . atorvastatin  80 mg Oral q1800  . budesonide-formoterol  2 puff Inhalation BID  . insulin aspart  0-9 Units Subcutaneous TID WC  . metoprolol tartrate  12.5 mg Oral BID  . montelukast  10 mg Oral QHS  . nicotine  14 mg Transdermal Daily  . QUEtiapine  200-600 mg Oral QHS  . sodium chloride  3 mL Intravenous Q12H  . varenicline  1 mg Oral BID   Infusions:    . sodium chloride 100 mL/hr (03/10/15 0218)  . sodium chloride 100 mL/hr at 03/10/15 0344  . heparin 900 Units/hr (03/10/15 0025)    Labs:  Recent Labs  03/09/15 2319 03/10/15 0323  NA 138 139  K 5.0 5.0  CL 105 106  CO2 23 25  GLUCOSE 136* 109*  BUN 27* 26*  CREATININE 2.84* 2.86*  CALCIUM 10.3 11.1*    Recent Labs  03/09/15 2319 03/10/15 0323  AST 16 16  ALT 10* 11*  ALKPHOS 130* 127*  BILITOT 0.3 0.4  PROT 6.7 7.1  ALBUMIN 3.5 3.5    Recent Labs  03/10/15 0323 03/10/15 0738  WBC 6.4 5.2  HGB 10.1* 9.2*  HCT 30.6* 27.5*  MCV 93.3 93.2  PLT 169 144*    Recent Labs  03/10/15 0157  TROPONINI 0.43*   Invalid input(s): POCBNP No results for input(s): HGBA1C in the last 72 hours.   Radiology/Studies:  Dg Chest 2 View  03/09/2015   CLINICAL DATA:  Acute onset of chest pain.  Initial encounter.  EXAM: CHEST  2 VIEW  COMPARISON:  Chest radiograph performed 07/07/2013  FINDINGS: The lungs are well-aerated. Pulmonary vascularity is at the upper limits of normal. Density near the lung bases is thought to reflect overlying soft tissues and possibly gynecomastia, better seen than on prior studies. No mass is seen on the lateral view. There is no evidence of focal opacification, pleural effusion or pneumothorax.  The heart is normal in size; the mediastinal  contour is within normal limits. No acute osseous abnormalities are seen. The patient is status post right-sided rotator cuff repair.  IMPRESSION: No acute cardiopulmonary process seen.   Electronically Signed   By: Garald Balding M.D.   On: 03/09/2015 23:36   US Renal  03/10/2015   CLINICAL DATA:  Acute kidney injury.  EXAM: RENAL / URINARY TRACT ULTRASOUND COMPLETE  COMPARISON:  None.  FINDINGS: Right Kidney:  Length: 10.7 cm. Echogenicity within normal limits. No mass or hydronephrosis visualized.  Left Kidney:  Length: 11.0 cm. Echogenicity within normal limits. No mass or hydronephrosis visualized.  Bladder:   Appears normal for degree of bladder distention.  IMPRESSION: Normal renal ultrasound.   Electronically Signed   By: Jeb Levering M.D.   On: 03/10/2015 06:29     Assessment and Plan  1. NSTEMI - Plan for diagnostic cardiac cath today. EKG with dynamic changes this admission, somewhat improved this AM. Due to stage III kidney disease, he is at high risk for contrast-induced nephropathy. Dr. Ellyn Hack discussed the risks and benefits of a diagnostic cath with the patient to include bleeding, infection, kidney damage, stroke, heart attack, death. The patient understands these risks and is willing to proceed.Continue heparin drip and normal saline infusion. Strict I&O. Continue ASA, statin. Hold parameters put on BB given softer BP to avoid renal hypoperfusion.  2. Tobacco abuse-  Begin nicotine patch 14mg . Tobacco cessation education.   3. Diabetes mellitus - Currently well-controlled per patient. His only outpatient diabetes medication is Januvia. Have asked pharmacy to assist with fixing home med rec as this still includes Janumet / insulin. He reports his last HgbA1c as 5.5, will recheck. Supplement sliding scale as needed.   4. Recent progression of CKD stage III-IV - baseline Cr previously normal up until admission for infected knee prosthesis in 09/2014, question antibiotic induced nephrotoxicity. He has never seen a nephrologist. Cr 1.1 _. 2.25 at Christmas -> 2.23 on 5/5 -> 2.84 overnight. As above, risk of CIN discussed with patient. Dr. Ellyn Hack also discussed case with Dr. Burt Knack. They agree to proceed with cath given EKG changes/NSTEMI, but will make every effort to conserve dye load. No LV gram. Avoid nephrotoxic agents. Will also ask nephrology to see (does not need to be prior to cath per Dr. Ellyn Hack).  5. Right knee periprosthetic MRSA infection - s/p abx earlier this year (finished in January), with antibiotic spacer placed 11/2014. Original plan was to replace prosthesis this upcoming  Friday, however, this will need to be postponed due to NSTEMI. If PCI is needed, may need BMS to reduce length of time necessary for DAPT. Will request to prep LEFT groin and bilateral radials.  Signed, Melina Copa PA-C Pager: [phone]   I seen and evaluated the patient emergency room along with Melina Copa, PA-C. I reviewed the H&P and all available data. The patient clearly has had progression of CAD with a creatinine down to 2.8 range. Unfortunately her parents with a non-STEMI with dynamic anterior T-wave inversions concerning for LAD lesion. He is currently pain-free. At this point he is receiving IV saline hydration, but I do think that the best option would be to go and proceed with cardiac catheterization using minimal contrast. Would not do LV gram. Consider possibly staging PCI.  Also in light of his need for upcoming knee surgery, would recommend bare-metal stenting in order to not postpone needed surgery any further than necessary.  I discussed the risks, benefits, alternatives and indications with the patient  as indicated above. Major concern for risks and this case would be worsening renal injury. We will have a nephrology consult see the patient post catheterization.  Initiate high-dose statin and beta blocker therapy. Hold off on ACE inhibitor pending nephrology consultation.  Looking cessation counseling. - Provide NicoDerm patch.   Leonie Man, M.D., M.S. Interventional Cardiologist   Pager # [phone]

## 2015-03-10 NOTE — H&P (View-Only) (Signed)
Patient: Chase Gibson / Admit Date: 03/09/2015 / Date of Encounter: 03/10/2015, 8:43 AM   Subjective: 59 y.o. Male with PMH of HLD, DM, COPD, 1 PPD tobacco abuse, and stage III kidney disease presenting with chest pain.   Overnight no acute issues. Chest pain free since 9PM last night. No dyspnea, palpitations, nausea, or vomiting. He is anxious and requesting food and his nicotine patch.    Objective: Telemetry: NSR Physical Exam: Blood pressure 108/73, pulse 69, temperature 97.7 F (36.5 C), temperature source Oral, resp. rate 15, height 5\' 10"  (1.778 m), weight 170 lb (77.111 kg), SpO2 98 %. General: Well developed, well nourished, in no acute distress. Head: Normocephalic, atraumatic, sclera non-icteric, no xanthomas, nares are without discharge. Neck: Negative for carotid bruits. JVP not elevated. Lungs: Clear bilaterally to auscultation without wheezes, rales, or rhonchi. Breathing is unlabored. Heart: RRR S1 S2 without murmurs, rubs, or gallops.  Abdomen: Soft, non-tender, non-distended with normoactive bowel sounds. No rebound/guarding. Extremities: No clubbing or cyanosis. No edema. Distal pedal pulses are 2+ and equal bilaterally. R knee immobilizer in-place.  Neuro: Alert and oriented X 3. Moves all extremities spontaneously. Psych:  Responds to questions appropriately with a normal affect.   Intake/Output Summary (Last 24 hours) at 03/10/15 0843 Last data filed at 03/10/15 0813  Gross per 24 hour  Intake      0 ml  Output   1180 ml  Net  -1180 ml    Inpatient Medications:  . aspirin  325 mg Oral Daily  . atorvastatin  80 mg Oral q1800  . budesonide-formoterol  2 puff Inhalation BID  . insulin aspart  0-9 Units Subcutaneous TID WC  . metoprolol tartrate  12.5 mg Oral BID  . montelukast  10 mg Oral QHS  . nicotine  14 mg Transdermal Daily  . QUEtiapine  200-600 mg Oral QHS  . sodium chloride  3 mL Intravenous Q12H  . varenicline  1 mg Oral BID   Infusions:    . sodium chloride 100 mL/hr (03/10/15 0218)  . sodium chloride 100 mL/hr at 03/10/15 0344  . heparin 900 Units/hr (03/10/15 0025)    Labs:  Recent Labs  03/09/15 2319 03/10/15 0323  NA 138 139  K 5.0 5.0  CL 105 106  CO2 23 25  GLUCOSE 136* 109*  BUN 27* 26*  CREATININE 2.84* 2.86*  CALCIUM 10.3 11.1*    Recent Labs  03/09/15 2319 03/10/15 0323  AST 16 16  ALT 10* 11*  ALKPHOS 130* 127*  BILITOT 0.3 0.4  PROT 6.7 7.1  ALBUMIN 3.5 3.5    Recent Labs  03/10/15 0323 03/10/15 0738  WBC 6.4 5.2  HGB 10.1* 9.2*  HCT 30.6* 27.5*  MCV 93.3 93.2  PLT 169 144*    Recent Labs  03/10/15 0157  TROPONINI 0.43*   Invalid input(s): POCBNP No results for input(s): HGBA1C in the last 72 hours.   Radiology/Studies:  Dg Chest 2 View  03/09/2015   CLINICAL DATA:  Acute onset of chest pain.  Initial encounter.  EXAM: CHEST  2 VIEW  COMPARISON:  Chest radiograph performed 07/07/2013  FINDINGS: The lungs are well-aerated. Pulmonary vascularity is at the upper limits of normal. Density near the lung bases is thought to reflect overlying soft tissues and possibly gynecomastia, better seen than on prior studies. No mass is seen on the lateral view. There is no evidence of focal opacification, pleural effusion or pneumothorax.  The heart is normal in size; the mediastinal  contour is within normal limits. No acute osseous abnormalities are seen. The patient is status post right-sided rotator cuff repair.  IMPRESSION: No acute cardiopulmonary process seen.   Electronically Signed   By: Garald Balding M.D.   On: 03/09/2015 23:36   US Renal  03/10/2015   CLINICAL DATA:  Acute kidney injury.  EXAM: RENAL / URINARY TRACT ULTRASOUND COMPLETE  COMPARISON:  None.  FINDINGS: Right Kidney:  Length: 10.7 cm. Echogenicity within normal limits. No mass or hydronephrosis visualized.  Left Kidney:  Length: 11.0 cm. Echogenicity within normal limits. No mass or hydronephrosis visualized.  Bladder:   Appears normal for degree of bladder distention.  IMPRESSION: Normal renal ultrasound.   Electronically Signed   By: Jeb Levering M.D.   On: 03/10/2015 06:29     Assessment and Plan  1. NSTEMI - Plan for diagnostic cardiac cath today. EKG with dynamic changes this admission, somewhat improved this AM. Due to stage III kidney disease, he is at high risk for contrast-induced nephropathy. Dr. Ellyn Hack discussed the risks and benefits of a diagnostic cath with the patient to include bleeding, infection, kidney damage, stroke, heart attack, death. The patient understands these risks and is willing to proceed.Continue heparin drip and normal saline infusion. Strict I&O. Continue ASA, statin. Hold parameters put on BB given softer BP to avoid renal hypoperfusion.  2. Tobacco abuse-  Begin nicotine patch 14mg . Tobacco cessation education.   3. Diabetes mellitus - Currently well-controlled per patient. His only outpatient diabetes medication is Januvia. Have asked pharmacy to assist with fixing home med rec as this still includes Janumet / insulin. He reports his last HgbA1c as 5.5, will recheck. Supplement sliding scale as needed.   4. Recent progression of CKD stage III-IV - baseline Cr previously normal up until admission for infected knee prosthesis in 09/2014, question antibiotic induced nephrotoxicity. He has never seen a nephrologist. Cr 1.1 _. 2.25 at Christmas -> 2.23 on 5/5 -> 2.84 overnight. As above, risk of CIN discussed with patient. Dr. Ellyn Hack also discussed case with Dr. Burt Knack. They agree to proceed with cath given EKG changes/NSTEMI, but will make every effort to conserve dye load. No LV gram. Avoid nephrotoxic agents. Will also ask nephrology to see (does not need to be prior to cath per Dr. Ellyn Hack).  5. Right knee periprosthetic MRSA infection - s/p abx earlier this year (finished in January), with antibiotic spacer placed 11/2014. Original plan was to replace prosthesis this upcoming  Friday, however, this will need to be postponed due to NSTEMI. If PCI is needed, may need BMS to reduce length of time necessary for DAPT. Will request to prep LEFT groin and bilateral radials.  Signed, Melina Copa PA-C Pager: [phone]   I seen and evaluated the patient emergency room along with Melina Copa, PA-C. I reviewed the H&P and all available data. The patient clearly has had progression of CAD with a creatinine down to 2.8 range. Unfortunately her parents with a non-STEMI with dynamic anterior T-wave inversions concerning for LAD lesion. He is currently pain-free. At this point he is receiving IV saline hydration, but I do think that the best option would be to go and proceed with cardiac catheterization using minimal contrast. Would not do LV gram. Consider possibly staging PCI.  Also in light of his need for upcoming knee surgery, would recommend bare-metal stenting in order to not postpone needed surgery any further than necessary.  I discussed the risks, benefits, alternatives and indications with the patient  as indicated above. Major concern for risks and this case would be worsening renal injury. We will have a nephrology consult see the patient post catheterization.  Initiate high-dose statin and beta blocker therapy. Hold off on ACE inhibitor pending nephrology consultation.  Looking cessation counseling. - Provide NicoDerm patch.   Leonie Man, M.D., M.S. Interventional Cardiologist   Pager # [phone]

## 2015-03-10 NOTE — Progress Notes (Signed)
ANTICOAGULATION CONSULT NOTE - Initial Consult  Pharmacy Consult for Heparin Indication: chest pain/ACS  Allergies  Allergen Reactions  . Tramadol Itching  . Nsaids Nausea And Vomiting and Rash    Patient Measurements: Height: 5\' 10"  (177.8 cm) Weight: 170 lb (77.111 kg) IBW/kg (Calculated) : 73  Vital Signs: Temp: 97.9 F (36.6 C) (05/17 2253) Temp Source: Oral (05/17 2253) BP: 114/62 mmHg (05/17 2330) Pulse Rate: 76 (05/17 2330)  Labs:  Recent Labs  03/09/15 2319  HGB 9.9*  HCT 29.6*  PLT 153  CREATININE 2.84*    Estimated Creatinine Clearance: 29.6 mL/min (by C-G formula based on Cr of 2.84).   Medical History: Past Medical History  Diagnosis Date  . ALLERGIC RHINITIS 03/18/2009  . ASTHMA 06/24/2007  . BREAST PAIN, LEFT 07/21/2008  . DEPRESSION 06/24/2007  . DIABETES MELLITUS, TYPE II 07/21/2008  . HYPERLIPIDEMIA 07/21/2008  . INSOMNIA-SLEEP DISORDER-UNSPEC 03/28/2010  . LOW BACK PAIN 09/03/2007  . NAUSEA 06/14/2010  . OSTEOARTHRITIS, KNEES, BILATERAL 08/25/2008  . PERIPHERAL EDEMA 07/21/2008  . SYMPTOM, ABNORMAL INVOLUNTARY MOVEMENT NEC 06/21/2007  . Wheezing 07/13/2009  . COPD (chronic obstructive pulmonary disease) 03/28/2012  . Colon polyps 03/28/2012  . GERD 08/25/2008    pt denies at PAT visit on 02/25/15  . ANXIETY 06/24/2007    pt denies at PAT visit on 02/25/15  . Vertigo 11/2014    pt has a bout every 3-4 months, no pattern    Medications:  Janumet  Albuterol  ASA  Norco  Lantus  Duoneb  Singulair  OxyIR  Seroquel  Zocor  Chantix  Assessment: 59 y.o. male with chest pain for heparin  Goal of Therapy:  Heparin level 0.3-0.7 units/ml Monitor platelets by anticoagulation protocol: Yes   Plan:  Heparin 4000 units IV bolus, then start heparin 900 units/hr Check heparin level in 8 hours.   Caryl Pina 03/10/2015,12:00 AM

## 2015-03-10 NOTE — Progress Notes (Addendum)
PHARMACY CONSULT NOTE  Prior to admission medications   Prior to admission medications have been updated. Per policy, some medications that patients no longer take are left in their PTA medication list and are highlighted in yellow to alert the provider the patient is no longer taking them. For example, Symbicort in this patient.  Please see updated list.   Harvel Quale  03/10/2015 8:51 AM   Addendum  [facility] for heparin Indication: chest pain/ACS  Allergies  Allergen Reactions  . Tramadol Itching  . Nsaids Nausea And Vomiting and Rash    Patient Measurements: Height: 5\' 10"  (177.8 cm) Weight: 170 lb (77.111 kg) IBW/kg (Calculated) : 73 Heparin Dosing Weight: 77.1 kg  Vital Signs: Temp: 97.7 F (36.5 C) (05/18 0705) Temp Source: Oral (05/18 0705) BP: 112/70 mmHg (05/18 0845) Pulse Rate: 78 (05/18 0845)  Labs:  Recent Labs  03/09/15 2319 03/10/15 0157 03/10/15 0323 03/10/15 0738  HGB 9.9*  --  10.1* 9.2*  HCT 29.6*  --  30.6* 27.5*  PLT 153  --  169 144*  APTT  --  88*  --   --   LABPROT  --  14.4  --   --   INR  --  1.11  --   --   HEPARINUNFRC  --   --   --  0.12*  CREATININE 2.84*  --  2.86*  --   TROPONINI  --  0.43*  --   --     Estimated Creatinine Clearance: 29.4 mL/min (by C-G formula based on Cr of 2.86).   Medical History: Past Medical History  Diagnosis Date  . ALLERGIC RHINITIS 03/18/2009  . ASTHMA 06/24/2007  . BREAST PAIN, LEFT 07/21/2008  . DEPRESSION 06/24/2007  . DIABETES MELLITUS, TYPE II 07/21/2008  . HYPERLIPIDEMIA 07/21/2008  . INSOMNIA-SLEEP DISORDER-UNSPEC 03/28/2010  . LOW BACK PAIN 09/03/2007  . NAUSEA 06/14/2010  . OSTEOARTHRITIS, KNEES, BILATERAL 08/25/2008  . PERIPHERAL EDEMA 07/21/2008  . SYMPTOM, ABNORMAL INVOLUNTARY MOVEMENT NEC 06/21/2007  . Wheezing 07/13/2009  . COPD (chronic obstructive pulmonary disease) 03/28/2012  . Colon polyps 03/28/2012  . GERD 08/25/2008    pt denies at PAT  visit on 02/25/15  . ANXIETY 06/24/2007    pt denies at PAT visit on 02/25/15  . Vertigo 11/2014    pt has a bout every 3-4 months, no pattern  . CKD (chronic kidney disease), stage III     Medications:  See EMR  Assessment: 59 yo male admitted with chest pain. Was placed on heparin gtt and nitroglycerin gtt (now off). Symptoms started on Monday. Pt also has TKA scheduled for this Friday. Initial troponin is positive, pt has AoCKD, current SCr 2.8, eCrCl 25-30 ml/min. Initial heparin level is SUBtherapeutic.  Goal of Therapy:  Heparin level 0.3-0.7 units/ml Monitor platelets by anticoagulation protocol: Yes   Plan:  Heparin bolus 3000 units x1, increase rate to 1100 units/hr Daily HL, CBC Recheck level this evening F/u cardiology plans   Harvel Quale  03/10/2015 9:00 AM

## 2015-03-10 NOTE — Progress Notes (Signed)
  Echocardiogram 2D Echocardiogram has been performed.  Lysle Rubens 03/10/2015, 4:31 PM

## 2015-03-10 NOTE — Progress Notes (Signed)
Utilization review completed. Compton Brigance, RN, BSN. 

## 2015-03-10 NOTE — H&P (Signed)
[facility] History and Physical  Chase Gibson PYP:[id number] DOB: 16-Jul-1956 DOA: 03/09/2015  Referring physician: ED physician PCP: Gara Kroner, MD  Specialists:   Chief Complaint: chest pain  HPI: Chase Gibson is a 59 y.o. male with PMH of hyperlipidemia, diabetes mellitus, GERD, depression, anxiety, COPD, back pain, bilateral knee pain, vertigo, chronic kidney disease-stage III, who presents with chest pain.  Patient reports that his chest pain started from yesterday morning, he had 3 episode of chest pain, each time lasted for about 30-40 minutes. The chest pain is located in the substernal area, radiating to the left arm, moderate pain. It is associated with nausea, but not vomiting. No shortness of breath or palpitation. She has mild cough due to smoking, which has not changed. His chest pain resolved with NTG x 1 by EMS. Currently patient is chest pain-free. He has chronic bilateral knee pain. He was scheduled for right knee surgery on this upcoming Friday.         .  Currently patient denies fever, chills, running nose, ear pain, headaches, abdominal pain, diarrhea, constipation, dysuria, urgency, frequency, hematuria, skin rashes. No unilateral weakness, numbness or tingling sensations. No vision change or hearing loss.  In ED, patient was found to have TWI in I-aVL and V2-V6 by EKG, mildly elevated troponin 0.26, AoCKD-III with Cr 2.84 (GFR 23), chest x-ray is negative for acute abnormalities. Patient is admitted to inpatient for further evaluation and treatment. Cardiology was consulted.  Where does patient live?   At home   Can patient participate in ADLs?  Yes    Review of Systems:   General: no fevers, chills, no changes in body weight, has fatigue HEENT: no blurry vision, hearing changes or sore throat Pulm: no dyspnea, has coughing, No wheezing CV: has chest pain, no palpitations Abd: no nausea, vomiting, abdominal pain, diarrhea, constipation GU: no  dysuria, burning on urination, increased urinary frequency, hematuria  Ext: No leg edema. Has bilateral knee pain.  Neuro: no unilateral weakness, numbness, or tingling, no vision change or hearing loss Skin: no rash MSK: No muscle spasm, no deformity, no limitation of range of movement in spin Heme: No easy bruising.  Travel history: No recent long distant travel.  Allergy:  Allergies  Allergen Reactions  . Tramadol Itching  . Nsaids Nausea And Vomiting and Rash    Past Medical History  Diagnosis Date  . ALLERGIC RHINITIS 03/18/2009  . ASTHMA 06/24/2007  . BREAST PAIN, LEFT 07/21/2008  . DEPRESSION 06/24/2007  . DIABETES MELLITUS, TYPE II 07/21/2008  . HYPERLIPIDEMIA 07/21/2008  . INSOMNIA-SLEEP DISORDER-UNSPEC 03/28/2010  . LOW BACK PAIN 09/03/2007  . NAUSEA 06/14/2010  . OSTEOARTHRITIS, KNEES, BILATERAL 08/25/2008  . PERIPHERAL EDEMA 07/21/2008  . SYMPTOM, ABNORMAL INVOLUNTARY MOVEMENT NEC 06/21/2007  . Wheezing 07/13/2009  . COPD (chronic obstructive pulmonary disease) 03/28/2012  . Colon polyps 03/28/2012  . GERD 08/25/2008    pt denies at PAT visit on 02/25/15  . ANXIETY 06/24/2007    pt denies at PAT visit on 02/25/15  . Vertigo 11/2014    pt has a bout every 3-4 months, no pattern  . CKD (chronic kidney disease), stage III     Past Surgical History  Procedure Laterality Date  . Right knee surgury      x 9  . Rotator cuff repair Bilateral     bilateral  . Inguinal herniorrhapy Bilateral     1979 & 1997  . Appendectomy    . Cholecystectomy    .  Tonsillectomy    . Total knee arthroplasty Right     right  . Knee arthroscopy Left     left    Social History:  reports that he has been smoking Cigarettes.  He has a 35 pack-year smoking history. He has never used smokeless tobacco. He reports that he does not drink alcohol or use illicit drugs.  Family History:  Family History  Problem Relation Age of Onset  . Coronary artery disease Other   . Emphysema Mother     never smoker  but spouse smoked  . Allergies Son   . Asthma Maternal Grandmother   . Rheum arthritis Sister      Prior to Admission medications   Medication Sig Start Date End Date Taking? Authorizing Provider  albuterol (VENTOLIN HFA) 108 (90 BASE) MCG/ACT inhaler Inhale 1-2 puffs into the lungs every 4 (four) hours as needed for wheezing or shortness of breath.     Historical Provider, MD  aspirin EC 325 MG tablet Take 325 mg by mouth daily.    Historical Provider, MD  budesonide-formoterol (SYMBICORT) 160-4.5 MCG/ACT inhaler Take 2 puffs first thing in am and then another 2 puffs about 12 hours later. Patient not taking: Reported on 10/16/2014 03/25/13   Tanda Rockers, MD  HYDROcodone-acetaminophen [facility]) 10-325 MG per tablet Take 1 tablet by mouth every 6 (six) hours as needed for moderate pain. 03/28/12   Biagio Borg, MD  insulin glargine (LANTUS) 100 UNIT/ML injection Inject 20 Units into the skin every evening.     Historical Provider, MD  ipratropium-albuterol (DUONEB) 0.5-2.5 (3) MG/3ML SOLN Take 3 mLs by nebulization every 6 (six) hours as needed (shortness of breath).    Historical Provider, MD  montelukast (SINGULAIR) 10 MG tablet Take 10 mg by mouth once.  09/28/11   Biagio Borg, MD  oxyCODONE (OXY IR/ROXICODONE) 5 MG immediate release tablet Take 10 mg by mouth every 4 (four) hours as needed for severe pain.    Historical Provider, MD  oxyCODONE-acetaminophen (PERCOCET/ROXICET) 5-325 MG per tablet Take 1 tablet by mouth every 6 (six) hours as needed for severe pain. Patient not taking: Reported on 10/16/2014 06/13/14   Domenic Moras, PA-C  QUEtiapine (SEROQUEL) 200 MG tablet Take 200-600 mg by mouth at bedtime.    Historical Provider, MD  simvastatin (ZOCOR) 10 MG tablet Take 20 mg by mouth daily.     Historical Provider, MD  sitaGLIPtan-metformin (JANUMET) 50-1000 MG per tablet Take 1 tablet by mouth 2 (two) times daily. 09/28/11   Biagio Borg, MD  sitaGLIPtin (JANUVIA) 50 MG tablet Take 50 mg by  mouth daily.    Historical Provider, MD  varenicline (CHANTIX) 1 MG tablet Take 1 mg by mouth 2 (two) times daily.    Historical Provider, MD    Physical Exam: Filed Vitals:   03/10/15 0630 03/10/15 0705 03/10/15 0715 03/10/15 0742  BP: 95/64  101/46 103/65  Pulse: 72  80 83  Temp:  97.7 F (36.5 C)    TempSrc:  Oral    Resp: 15  15 14   Height:      Weight:      SpO2: 97%  97% 99%   General: Not in acute distress HEENT:       Eyes: PERRL, EOMI, no scleral icterus.       ENT: No discharge from the ears and nose, no pharynx injection, no tonsillar enlargement.        Neck: No JVD, no bruit, no  mass felt. Heme: No neck lymph node enlargement. Cardiac: S1/S2, RRR, No murmurs, No gallops or rubs. Pulm: Good air movement bilaterally. No rales, wheezing, rhonchi or rubs. Abd: Soft, nondistended, nontender, no rebound pain, no organomegaly, BS present. Ext: No edema bilaterally. 2+DP/PT pulse bilaterally. Has tenderness over right knee and has knee brace on Musculoskeletal: No joint deformities, No joint redness or warmth, no limitation of ROM in spin. Skin: No rashes.  Neuro: Alert, oriented X3, cranial nerves II-XII grossly intact, muscle strength 5/5 in all extremities, sensation to light touch intact.  Psych: Patient is not psychotic, no suicidal or hemocidal ideation.  Labs on Admission:  Basic Metabolic Panel:  Recent Labs Lab 03/09/15 2319 03/10/15 0323  NA 138 139  K 5.0 5.0  CL 105 106  CO2 23 25  GLUCOSE 136* 109*  BUN 27* 26*  CREATININE 2.84* 2.86*  CALCIUM 10.3 11.1*   Liver Function Tests:  Recent Labs Lab 03/09/15 2319 03/10/15 0323  AST 16 16  ALT 10* 11*  ALKPHOS 130* 127*  BILITOT 0.3 0.4  PROT 6.7 7.1  ALBUMIN 3.5 3.5   No results for input(s): LIPASE, AMYLASE in the last 168 hours. No results for input(s): AMMONIA in the last 168 hours. CBC:  Recent Labs Lab 03/09/15 2319 03/10/15 0323  WBC 5.7 6.4  HGB 9.9* 10.1*  HCT 29.6* 30.6*   MCV 92.5 93.3  PLT 153 169   Cardiac Enzymes:  Recent Labs Lab 03/10/15 0157  TROPONINI 0.43*    BNP (last 3 results)  Recent Labs  03/10/15 0322  BNP 164.2*    ProBNP (last 3 results) No results for input(s): PROBNP in the last 8760 hours.  CBG:  Recent Labs Lab 03/10/15 0637  GLUCAP 106*    Radiological Exams on Admission: Dg Chest 2 View  03/09/2015   CLINICAL DATA:  Acute onset of chest pain.  Initial encounter.  EXAM: CHEST  2 VIEW  COMPARISON:  Chest radiograph performed 07/07/2013  FINDINGS: The lungs are well-aerated. Pulmonary vascularity is at the upper limits of normal. Density near the lung bases is thought to reflect overlying soft tissues and possibly gynecomastia, better seen than on prior studies. No mass is seen on the lateral view. There is no evidence of focal opacification, pleural effusion or pneumothorax.  The heart is normal in size; the mediastinal contour is within normal limits. No acute osseous abnormalities are seen. The patient is status post right-sided rotator cuff repair.  IMPRESSION: No acute cardiopulmonary process seen.   Electronically Signed   By: Garald Balding M.D.   On: 03/09/2015 23:36   US Renal  03/10/2015   CLINICAL DATA:  Acute kidney injury.  EXAM: RENAL / URINARY TRACT ULTRASOUND COMPLETE  COMPARISON:  None.  FINDINGS: Right Kidney:  Length: 10.7 cm. Echogenicity within normal limits. No mass or hydronephrosis visualized.  Left Kidney:  Length: 11.0 cm. Echogenicity within normal limits. No mass or hydronephrosis visualized.  Bladder:  Appears normal for degree of bladder distention.  IMPRESSION: Normal renal ultrasound.   Electronically Signed   By: Jeb Levering M.D.   On: 03/10/2015 06:29    EKG: Independently reviewed.  Abnormal findings:  Low voltage, T-wave inversion in V2 to V6, and lead 1 and aVL. LAE, QTC 462., all changes are NEW COMPARED WITH PREVIOUS EKG ON 03/18/09.   Assessment/Plan Principal Problem:   Chest  pain Active Problems:   Diabetes mellitus without complication   HLD (hyperlipidemia)   Anxiety state  Depression   GERD   Acute coronary syndrome   Knee pain, bilateral   Acute renal failure superimposed on stage 3 chronic kidney disease  Chest pain: Given new TWI, ongoing chest pain, and risk factors including hyperlipidemia, diabetes mellitus, it is likely that patient has acute coronary artery syndrome. Cardiology was consulted. -will admit to SDU -IV heparin and nitroglycerin drip -Aspirin, metoprolol -Switched to simvastatin to Lipitor 80 mg daily - cycle CE q6 x3 and repeat her EKG in the am  - Morphine prn -will not check FLP and A1C, since patient had A1c 5.9 on 02/25/15 and LDL 38 on 03/26/15. -check UDS and HIV ab - 2d echo -Follow-up card recommendations.  Diabetes mellitus: A1c was 5.9 on 02/25/15, well controlled. Patient is on Januvia and Lantus. -Decrease Lantus dose from 20 to 15 units  -sliding scale insulin  HLD (hyperlipidemia): LDL was 38 on 03/26/15 -Lipitor as above  Anxiety state and Depression: Stable, no suicidal or homicidal ideations. -Continue Seroquel  Knee pain, bilateral: He was scheduled for right knee surgery on this upcoming Friday, will have to postpone -follow up with orth -pain control: Norco  Acute renal failure superimposed on stage 3 chronic kidney disease: Recent creatinine 2.3-2.4. Her creatinine is 2.84. -will check Fena and US-renal -IVF: NS 100 cc/h -follow up renal Fx by BMP  Tobacco abuse: -Did counseling about importance of quitting smoking -Chantix   DVT ppx: on IV Heparin     Code Status: Full code Family Communication: None at bed side.   Disposition Plan: Admit to inpatient   Date of Service 03/10/2015    Ivor Costa [facility] Pager [phone]  If 7PM-7AM, please contact night-coverage [email or web address] Password [email or web address] 03/10/2015, 8:03 AM

## 2015-03-10 NOTE — ED Notes (Signed)
Breakfast tray order @0638 .

## 2015-03-11 ENCOUNTER — Other Ambulatory Visit: Payer: Self-pay | Admitting: Cardiology

## 2015-03-11 ENCOUNTER — Telehealth: Payer: Self-pay | Admitting: Cardiology

## 2015-03-11 ENCOUNTER — Encounter (HOSPITAL_COMMUNITY): Payer: Self-pay | Admitting: Cardiology

## 2015-03-11 DIAGNOSIS — E118 Type 2 diabetes mellitus with unspecified complications: Secondary | ICD-10-CM

## 2015-03-11 DIAGNOSIS — N189 Chronic kidney disease, unspecified: Secondary | ICD-10-CM

## 2015-03-11 DIAGNOSIS — D72829 Elevated white blood cell count, unspecified: Secondary | ICD-10-CM

## 2015-03-11 DIAGNOSIS — Z9861 Coronary angioplasty status: Secondary | ICD-10-CM

## 2015-03-11 DIAGNOSIS — I2510 Atherosclerotic heart disease of native coronary artery without angina pectoris: Secondary | ICD-10-CM

## 2015-03-11 DIAGNOSIS — I252 Old myocardial infarction: Secondary | ICD-10-CM

## 2015-03-11 DIAGNOSIS — N289 Disorder of kidney and ureter, unspecified: Secondary | ICD-10-CM

## 2015-03-11 DIAGNOSIS — I214 Non-ST elevation (NSTEMI) myocardial infarction: Principal | ICD-10-CM

## 2015-03-11 HISTORY — DX: Old myocardial infarction: I25.2

## 2015-03-11 HISTORY — DX: Atherosclerotic heart disease of native coronary artery without angina pectoris: I25.10

## 2015-03-11 LAB — DIFFERENTIAL
BASOS PCT: 1 % (ref 0–1)
Basophils Absolute: 0 10*3/uL (ref 0.0–0.1)
Eosinophils Absolute: 0.1 10*3/uL (ref 0.0–0.7)
Eosinophils Relative: 2 % (ref 0–5)
LYMPHS PCT: 36 % (ref 12–46)
Lymphs Abs: 1.5 10*3/uL (ref 0.7–4.0)
MONO ABS: 0.4 10*3/uL (ref 0.1–1.0)
Monocytes Relative: 10 % (ref 3–12)
NEUTROS ABS: 2.2 10*3/uL (ref 1.7–7.7)
NEUTROS PCT: 51 % (ref 43–77)

## 2015-03-11 LAB — COMPREHENSIVE METABOLIC PANEL
ALT: 9 U/L — ABNORMAL LOW (ref 17–63)
AST: 14 U/L — ABNORMAL LOW (ref 15–41)
Albumin: 3 g/dL — ABNORMAL LOW (ref 3.5–5.0)
Alkaline Phosphatase: 109 U/L (ref 38–126)
Anion gap: 5 (ref 5–15)
BUN: 23 mg/dL — ABNORMAL HIGH (ref 6–20)
CALCIUM: 8.8 mg/dL — AB (ref 8.9–10.3)
CO2: 20 mmol/L — AB (ref 22–32)
CREATININE: 2.53 mg/dL — AB (ref 0.61–1.24)
Chloride: 114 mmol/L — ABNORMAL HIGH (ref 101–111)
GFR calc Af Amer: 31 mL/min — ABNORMAL LOW (ref >60)
GFR, EST NON AFRICAN AMERICAN: 27 mL/min — AB (ref >60)
GLUCOSE: 75 mg/dL (ref 65–99)
Potassium: 5.1 mmol/L (ref 3.5–5.1)
Sodium: 139 mmol/L (ref 135–145)
Total Bilirubin: 0.4 mg/dL (ref 0.3–1.2)
Total Protein: 6.1 g/dL — ABNORMAL LOW (ref 6.5–8.1)

## 2015-03-11 LAB — PROTEIN ELECTROPHORESIS, SERUM
A/G RATIO SPE: 1.1 (ref 0.7–2.0)
Albumin ELP: 3.3 g/dL (ref 3.2–5.6)
Alpha-1-Globulin: 0.3 g/dL (ref 0.1–0.4)
Alpha-2-Globulin: 0.7 g/dL (ref 0.4–1.2)
BETA GLOBULIN: 0.9 g/dL (ref 0.6–1.3)
Gamma Globulin: 1.2 g/dL (ref 0.5–1.6)
Globulin, Total: 3.1 g/dL (ref 2.0–4.5)
TOTAL PROTEIN ELP: 6.4 g/dL (ref 6.0–8.5)

## 2015-03-11 LAB — LIPID PANEL
CHOL/HDL RATIO: 5.2 ratio
CHOLESTEROL: 145 mg/dL (ref 0–200)
HDL: 28 mg/dL — ABNORMAL LOW (ref >40)
LDL Cholesterol: 90 mg/dL (ref 0–99)
Triglycerides: 136 mg/dL (ref <150)
VLDL: 27 mg/dL (ref 0–40)

## 2015-03-11 LAB — HEPATITIS C ANTIBODY (REFLEX): HCV Ab: NEGATIVE

## 2015-03-11 LAB — GLUCOSE, CAPILLARY: Glucose-Capillary: 92 mg/dL (ref 65–99)

## 2015-03-11 LAB — PHOSPHORUS: PHOSPHORUS: 3.9 mg/dL (ref 2.5–4.6)

## 2015-03-11 LAB — PARATHYROID HORMONE, INTACT (NO CA): PTH: 15 pg/mL (ref 15–65)

## 2015-03-11 MED ORDER — ATORVASTATIN CALCIUM 80 MG PO TABS: 80.0000 mg | ORAL_TABLET | Freq: Every day | ORAL | Status: DC

## 2015-03-11 MED ORDER — ASPIRIN 81 MG PO CHEW: 81.0000 mg | CHEWABLE_TABLET | Freq: Every day | ORAL | Status: DC

## 2015-03-11 MED ORDER — TICAGRELOR 90 MG PO TABS: 90.0000 mg | ORAL_TABLET | Freq: Two times a day (BID) | ORAL | Status: DC

## 2015-03-11 MED ORDER — METOPROLOL TARTRATE 25 MG PO TABS: 12.5000 mg | ORAL_TABLET | Freq: Two times a day (BID) | ORAL | Status: DC

## 2015-03-11 MED ORDER — NICOTINE 14 MG/24HR TD PT24: 14.0000 mg | MEDICATED_PATCH | Freq: Every day | TRANSDERMAL | Status: DC

## 2015-03-11 MED FILL — Heparin Sodium (Porcine) 2 Unit/ML in Sodium Chloride 0.9%: INTRAMUSCULAR | Qty: 1000 | Status: AC

## 2015-03-11 MED FILL — Lidocaine HCl Local Preservative Free (PF) Inj 1%: INTRAMUSCULAR | Qty: 30 | Status: AC

## 2015-03-11 NOTE — Telephone Encounter (Signed)
°  1. Which medications need to be refilled? Nitroglycerin- new prescription,was suppose to have received this when discharged from the hospital today-no chest pains 2. Which pharmacy is medication to be sent to?[facility]-[phone]  3. Do they need a 30 day or 90 day supply?   4. Would they like a call back once the medication has been sent to the pharmacy? yes

## 2015-03-11 NOTE — Telephone Encounter (Signed)
Yes - we can call in NTG Rx

## 2015-03-11 NOTE — Progress Notes (Signed)
[id number] Did not walk pt as he is to limit walking due to knee issues. Did not give ex ed and pt is not appropriate for CRP 2 at this time. Reviewed MI restrictions, calling 911 and NTG use, importance of brililnta with stent. Pt voiced understanding. Pt is going to use e cig as he has used in past.  He  quit for 9 months. Left smoking cessation handout. Discussed that e cig is not FDA approved but pt feels comfortable with its use. Pt stated he knows what to eat to keep diabetes under control for him and A1C at 5.9. And he has lost 50 lbs recently. Graylon Good RN BSN 03/11/2015 10:08 AM  .

## 2015-03-11 NOTE — Discharge Summary (Signed)
Patient ID: Chase Gibson,  MRN: [id number], DOB/AGE: [birth year] 15 y.o.  Admit date: 03/09/2015 Discharge date: 03/11/2015  Primary Care Provider: Gara Kroner, MD Primary Cardiologist: Dr Ellyn Hack  Discharge Diagnoses Principal Problem:   NSTEMI, initial episode of care Active Problems:   CAD S/P LAD BMS 03/10/15   Type II diabetes mellitus with complication   Hyperlipidemia with target LDL less than 70   Acute on chronic renal insufficiency   Anxiety state   Depression   GERD   DJD (degenerative joint disease) of knee   Tobacco abuse    Procedures:  Cath/ LAD BMS 03/10/15   Hospital Course:  59 yo man with DM, HLD, tobacco use and CKD stage III-IV since around 09/2014 who presented 03/09/15 with chest pain. He ruled in for a NSTEMI (Troponin peak 0.43). Other issues include Rt knee peri prosthetic MRSA infection s/p abx earlier this year (finished in January), with antibiotic spacer placed 11/2014. Original plan was to replace prosthesis this upcoming Friday, however, this will need to be postponed due to NSTEMI. The pt also was noted to have an elevated SCr not previously evaluated by Nephrology. Dr Deterding saw him in consult 03/10/15. On 03/10/15 he went for cath (SCr 2.86). He underwent LAD BMS placement without complications. His SCr on 03/11/15 is 2.53. Dr Ellyn Hack feels the pt can be discharged. He'll need a BMP and CBC on Monday and an OV with an APP in 7 days (TOC) and Dr Ellyn Hack in 6-8 weeks. He could potentially have his knee replacement in 6-8 weeks after Dr Ellyn Hack clears him.    Discharge Vitals:  Blood pressure 97/51, pulse 69, temperature 97.5 F (36.4 C), temperature source Oral, resp. rate 22, height 5\' 10"  (1.778 m), weight 169 lb 15.6 oz (77.1 kg), SpO2 96 %.    Labs: Results for orders placed or performed during the hospital encounter of 03/09/15 (from the past 24 hour(s))  Urinalysis, Routine w reflex microscopic     Status: None   Collection Time:  03/10/15 12:17 PM  Result Value Ref Range   Color, Urine YELLOW YELLOW   APPearance CLEAR CLEAR   Specific Gravity, Urine 1.008 1.005 - 1.030   pH 5.0 5.0 - 8.0   Glucose, UA NEGATIVE NEGATIVE mg/dL   Hgb urine dipstick NEGATIVE NEGATIVE   Bilirubin Urine NEGATIVE NEGATIVE   Ketones, ur NEGATIVE NEGATIVE mg/dL   Protein, ur NEGATIVE NEGATIVE mg/dL   Urobilinogen, UA 0.2 0.0 - 1.0 mg/dL   Nitrite NEGATIVE NEGATIVE   Leukocytes, UA NEGATIVE NEGATIVE  Sodium, urine, random     Status: None   Collection Time: 03/10/15 12:17 PM  Result Value Ref Range   Sodium, Ur 113 mmol/L  Creatinine, urine, random     Status: None   Collection Time: 03/10/15 12:17 PM  Result Value Ref Range   Creatinine, Urine 34.37 mg/dL  Glucose, capillary     Status: Abnormal   Collection Time: 03/10/15 12:22 PM  Result Value Ref Range   Glucose-Capillary 115 (H) 65 - 99 mg/dL  Troponin I (q 6hr x 3)     Status: Abnormal   Collection Time: 03/10/15 12:43 PM  Result Value Ref Range   Troponin I 0.32 (H) <0.031 ng/mL  Hepatitis B surface antigen     Status: None   Collection Time: 03/10/15 12:43 PM  Result Value Ref Range   Hepatitis B Surface Ag NEGATIVE NEGATIVE  Iron and TIBC     Status: Abnormal  Collection Time: 03/10/15 12:43 PM  Result Value Ref Range   Iron 121 45 - 182 ug/dL   TIBC 267 250 - 450 ug/dL   Saturation Ratios 45 (H) 17.9 - 39.5 %   UIBC 146 ug/dL  Uric acid     Status: None   Collection Time: 03/10/15 12:43 PM  Result Value Ref Range   Uric Acid, Serum 5.0 4.4 - 7.6 mg/dL  Heparin level (unfractionated)     Status: Abnormal   Collection Time: 03/10/15  2:15 PM  Result Value Ref Range   Heparin Unfractionated 0.20 (L) 0.30 - 0.70 IU/mL  Glucose, capillary     Status: Abnormal   Collection Time: 03/10/15  4:23 PM  Result Value Ref Range   Glucose-Capillary 103 (H) 65 - 99 mg/dL  POCT Activated clotting time     Status: None   Collection Time: 03/10/15  6:13 PM  Result Value  Ref Range   Activated Clotting Time 429 seconds  Glucose, capillary     Status: None   Collection Time: 03/10/15  7:21 PM  Result Value Ref Range   Glucose-Capillary 86 65 - 99 mg/dL  Lipid panel     Status: Abnormal   Collection Time: 03/11/15  4:40 AM  Result Value Ref Range   Cholesterol 145 0 - 200 mg/dL   Triglycerides 136 <150 mg/dL   HDL 28 (L) >40 mg/dL   Total CHOL/HDL Ratio 5.2 RATIO   VLDL 27 0 - 40 mg/dL   LDL Cholesterol 90 0 - 99 mg/dL  Differential     Status: None   Collection Time: 03/11/15  4:40 AM  Result Value Ref Range   Neutrophils Relative % 51 43 - 77 %   Neutro Abs 2.2 1.7 - 7.7 K/uL   Lymphocytes Relative 36 12 - 46 %   Lymphs Abs 1.5 0.7 - 4.0 K/uL   Monocytes Relative 10 3 - 12 %   Monocytes Absolute 0.4 0.1 - 1.0 K/uL   Eosinophils Relative 2 0 - 5 %   Eosinophils Absolute 0.1 0.0 - 0.7 K/uL   Basophils Relative 1 0 - 1 %   Basophils Absolute 0.0 0.0 - 0.1 K/uL  Phosphorus     Status: None   Collection Time: 03/11/15  4:40 AM  Result Value Ref Range   Phosphorus 3.9 2.5 - 4.6 mg/dL  Comprehensive metabolic panel     Status: Abnormal   Collection Time: 03/11/15  4:40 AM  Result Value Ref Range   Sodium 139 135 - 145 mmol/L   Potassium 5.1 3.5 - 5.1 mmol/L   Chloride 114 (H) 101 - 111 mmol/L   CO2 20 (L) 22 - 32 mmol/L   Glucose, Bld 75 65 - 99 mg/dL   BUN 23 (H) 6 - 20 mg/dL   Creatinine, Ser 2.53 (H) 0.61 - 1.24 mg/dL   Calcium 8.8 (L) 8.9 - 10.3 mg/dL   Total Protein 6.1 (L) 6.5 - 8.1 g/dL   Albumin 3.0 (L) 3.5 - 5.0 g/dL   AST 14 (L) 15 - 41 U/L   ALT 9 (L) 17 - 63 U/L   Alkaline Phosphatase 109 38 - 126 U/L   Total Bilirubin 0.4 0.3 - 1.2 mg/dL   GFR calc non Af Amer 27 (L) >60 mL/min   GFR calc Af Amer 31 (L) >60 mL/min   Anion gap 5 5 - 15  Glucose, capillary     Status: None   Collection Time: 03/11/15  6:49  AM  Result Value Ref Range   Glucose-Capillary 92 65 - 99 mg/dL   Comment 1 Notify RN    Comment 2 Document in Chart       Disposition:      Follow-up Information    Follow up with Leonie Man, MD.   Specialty:  Cardiology   Why:  office will contact you   Contact information:   [address] [place] [address] [phone]       Follow up with DETERDING,JAMES L, MD.   Specialty:  Nephrology   Why:  call for follow up   Contact information:   [address] [place] [address] [phone]       Follow up with Dereck Leep, MD.   Specialty:  Orthopedic Surgery   Why:  call office for follow up   Contact information:   [address] [place] [address] [phone]       Discharge Medications:    Medication List    STOP taking these medications        aspirin EC 325 MG tablet  Replaced by:  aspirin 81 MG chewable tablet     budesonide-formoterol 160-4.5 MCG/ACT inhaler  Commonly known as:  SYMBICORT     oxyCODONE 5 MG immediate release tablet  Commonly known as:  Oxy IR/ROXICODONE     simvastatin 10 MG tablet  Commonly known as:  ZOCOR      TAKE these medications        aspirin 81 MG chewable tablet  Chew 1 tablet (81 mg total) by mouth daily.     atorvastatin 80 MG tablet  Commonly known as:  LIPITOR  Take 1 tablet (80 mg total) by mouth daily at 6 PM.     HYDROcodone-acetaminophen 10-325 MG per tablet  Commonly known as:  NORCO  Take 1 tablet by mouth every 6 (six) hours as needed for moderate pain.     ipratropium-albuterol 0.5-2.5 (3) MG/3ML Soln  Commonly known as:  DUONEB  Take 3 mLs by nebulization every 6 (six) hours as needed (shortness of breath).     metoprolol tartrate 25 MG tablet  Commonly known as:  LOPRESSOR  Take 0.5 tablets (12.5 mg total) by mouth 2 (two) times daily.     montelukast 10 MG tablet  Commonly known as:  SINGULAIR  Take 10 mg by mouth once.     nicotine 14 mg/24hr patch  Commonly known as:  NICODERM CQ - dosed in mg/24 hours  Place 1 patch (14 mg total) onto the skin daily.     QUEtiapine  200 MG tablet  Commonly known as:  SEROQUEL  Take 200-600 mg by mouth at bedtime.     sitaGLIPtin 50 MG tablet  Commonly known as:  JANUVIA  Take 50 mg by mouth daily.     ticagrelor 90 MG Tabs tablet  Commonly known as:  BRILINTA  Take 1 tablet (90 mg total) by mouth 2 (two) times daily.     varenicline 1 MG tablet  Commonly known as:  CHANTIX  Take 1 mg by mouth 2 (two) times daily.     VENTOLIN HFA 108 (90 BASE) MCG/ACT inhaler  Generic drug:  albuterol  Inhale 1-2 puffs into the lungs every 4 (four) hours as needed for wheezing or shortness of breath.         Duration of Discharge Encounter: Greater than 30 minutes including physician time.  Angelena Form PA-C 03/11/2015 8:26 AM

## 2015-03-11 NOTE — Progress Notes (Signed)
TR BAND REMOVAL  LOCATION:  right radial  DEFLATED PER PROTOCOL:  Yes.    TIME BAND OFF / DRESSING APPLIED:   2315   SITE UPON ARRIVAL:   Level 0  SITE AFTER BAND REMOVAL:  Level 0  REVERSE ALLEN'S TEST:    positive  CIRCULATION SENSATION AND MOVEMENT:  Within Normal Limits  Yes.    COMMENTS:

## 2015-03-11 NOTE — Progress Notes (Signed)
CM spoke with pt  regarding  Brilinta.  Brilinta  pamphlet with 30 day free card @ pt's bedside,CM reinforced  Information. Pt uses [facility], [phone]. CM called pharmacy and confirmed medication is in stock. No other needs identified @ the present.

## 2015-03-11 NOTE — Discharge Instructions (Signed)
Coronary Angiogram With Stent, Care After Refer to this sheet in the next few weeks. These instructions provide you with information on caring for yourself after your procedure. Your health care provider may also give you more specific instructions. Your treatment has been planned according to current medical practices, but problems sometimes occur. Call your health care provider if you have any problems or questions after your procedure.  WHAT TO EXPECT AFTER THE PROCEDURE  The insertion site may be tender for a few days after your procedure. HOME CARE INSTRUCTIONS   Take medicines only as directed by your health care provider. Blood thinners may be prescribed after your procedure to improve blood flow through the stent.  Change any bandages (dressings) as directed by your health care provider.   Check your insertion site every day for redness, swelling, or fluid leaking from the insertion.   Do not take baths, swim, or use a hot tub until your health care provider approves. You may shower. Pat the insertion area dry. Do not rub the insertion area with a washcloth or towel.   Eat a heart-healthy diet. This should include plenty of fresh fruits and vegetables. Meat should be lean cuts. Avoid the following types of food:   Food that is high in salt.   Canned or highly processed food.   Food that is high in saturated fat or sugar.   Fried food.   Make any other lifestyle changes recommended by your health care provider. This may include:   Not using any tobacco products including cigarettes, chewing tobacco, or electronic cigarettes.  Managing your weight.   Getting regular exercise.   Managing your blood pressure.   Limiting your alcohol intake.   Managing other health problems, such as diabetes.   If you need an MRI after your heart stent was placed, be sure to tell the health care provider who orders the MRI that you have a heart stent.   Keep all follow-up  visits as directed by your health care provider.  SEEK IMMEDIATE MEDICAL CARE IF:   You develop chest pain, shortness of breath, feel faint, or pass out.  You have bleeding, swelling larger than a walnut, or drainage from the catheter insertion site.  You develop pain, discoloration, coldness, or severe bruising in the leg or arm that held the catheter.  You develop bleeding from any other place such as from the bowels. There may be bright red blood in the urine or stools, or it may appear as black, tarry stools.  You have a fever or chills. MAKE SURE YOU:  Understand these instructions.  Will watch your condition.  Will get help right away if you are not doing well or get worse. Document Released: 04/28/2005 Document Revised: 02/23/2014 Document Reviewed: 03/12/2013 [facility] Patient Information 2015 [place], [place]. This information is not intended to replace advice given to you by your health care provider. Make sure you discuss any questions you have with your health care provider.  [place]

## 2015-03-11 NOTE — Progress Notes (Signed)
Subjective: Interval History: has no complaint, feels much relieved.  Objective: Vital signs in last 24 hours: Temp:  [97.5 F (36.4 C)-98.3 F (36.8 C)] 97.5 F (36.4 C) (05/19 0541) Pulse Rate:  [63-85] 69 (05/19 0541) Resp:  [9-27] 22 (05/19 0541) BP: (86-146)/(35-81) 97/51 mmHg (05/19 0550) SpO2:  [93 %-99 %] 96 % (05/19 0541) Weight:  [77.1 kg (169 lb 15.6 oz)-77.11 kg (170 lb)] 77.1 kg (169 lb 15.6 oz) (05/19 0032) Weight change: -0.001 kg (-0.1 oz)  Intake/Output from previous day: 05/18 0701 - 05/19 0700 In: 254.4 [P.O.:240; I.V.:14.4] Out: 2780 [Urine:2780] Intake/Output this shift:    General appearance: alert, cooperative and pale Chest wall: no tenderness Cardio: systolic murmur: holosystolic 2/6, blowing at apex GI: pos bs,soft Extremities: no edema, brace R knee  Lungs. Decreased bs, rales in bases  Lab Results:  Recent Labs  03/10/15 0323 03/10/15 0738  WBC 6.4 5.2  HGB 10.1* 9.2*  HCT 30.6* 27.5*  PLT 169 144*   BMET:  Recent Labs  03/10/15 0323 03/11/15 0440  NA 139 139  K 5.0 5.1  CL 106 114*  CO2 25 20*  GLUCOSE 109* 75  BUN 26* 23*  CREATININE 2.86* 2.53*  CALCIUM 11.1* 8.8*   No results for input(s): PTH in the last 72 hours. Iron Studies:  Recent Labs  03/10/15 1243  IRON 121  TIBC 267    Studies/Results: Dg Chest 2 View  03/09/2015   CLINICAL DATA:  Acute onset of chest pain.  Initial encounter.  EXAM: CHEST  2 VIEW  COMPARISON:  Chest radiograph performed 07/07/2013  FINDINGS: The lungs are well-aerated. Pulmonary vascularity is at the upper limits of normal. Density near the lung bases is thought to reflect overlying soft tissues and possibly gynecomastia, better seen than on prior studies. No mass is seen on the lateral view. There is no evidence of focal opacification, pleural effusion or pneumothorax.  The heart is normal in size; the mediastinal contour is within normal limits. No acute osseous abnormalities are seen. The  patient is status post right-sided rotator cuff repair.  IMPRESSION: No acute cardiopulmonary process seen.   Electronically Signed   By: Garald Balding M.D.   On: 03/09/2015 23:36   US Renal  03/10/2015   CLINICAL DATA:  Acute kidney injury.  EXAM: RENAL / URINARY TRACT ULTRASOUND COMPLETE  COMPARISON:  None.  FINDINGS: Right Kidney:  Length: 10.7 cm. Echogenicity within normal limits. No mass or hydronephrosis visualized.  Left Kidney:  Length: 11.0 cm. Echogenicity within normal limits. No mass or hydronephrosis visualized.  Bladder:  Appears normal for degree of bladder distention.  IMPRESSION: Normal renal ultrasound.   Electronically Signed   By: Jeb Levering M.D.   On: 03/10/2015 06:29    I have reviewed the patient's current medications.  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 Has anemia, mild acidemia, and suspect HPTH will f/u in office.   2 CAD s/p stent 3 DM emphasized control 4 smoking counseled P office f/u    LOS: 1 day   Cruzito Standre L 03/11/2015,9:11 AM

## 2015-03-12 ENCOUNTER — Inpatient Hospital Stay: Admission: RE | Admit: 2015-03-12 | Payer: Medicare Other | Source: Ambulatory Visit | Admitting: Orthopedic Surgery

## 2015-03-12 ENCOUNTER — Telehealth: Payer: Self-pay | Admitting: Cardiology

## 2015-03-12 ENCOUNTER — Other Ambulatory Visit: Payer: Self-pay | Admitting: Cardiology

## 2015-03-12 LAB — UIFE/LIGHT CHAINS/TP QN, 24-HR UR
% BETA, Urine: 41 %
ALPHA 1 URINE: 2.2 %
ALPHA 2 UR: 16.8 %
Albumin, U: 12.4 %
FREE LAMBDA LT CHAINS, UR: 22.9 mg/L — AB (ref 0.24–6.66)
Free Kappa/Lambda Ratio: 5.98 (ref 2.04–10.37)
Free Lt Chn Excr Rate: 137 mg/L — ABNORMAL HIGH (ref 1.35–24.19)
GAMMA GLOBULIN URINE: 27.7 %
Total Protein, Urine: <4 mg/dL

## 2015-03-12 SURGERY — ARTHROPLASTY, KNEE, TOTAL
Anesthesia: Choice | Laterality: Right

## 2015-03-12 MED ORDER — NITROGLYCERIN 0.4 MG SL SUBL: 0.4000 mg | SUBLINGUAL_TABLET | SUBLINGUAL | Status: DC | PRN

## 2015-03-12 NOTE — Telephone Encounter (Signed)
Called patient, left message on voice mail instructing that Rx available to pickup at pharmacy of record, if any questions to call. Gave number for our office w/ my desk extension.

## 2015-03-12 NOTE — Telephone Encounter (Signed)
Called patient - got voice mail. Left message instructing pt to callback.

## 2015-03-12 NOTE — Telephone Encounter (Signed)
Pt needs a TCM call  Appt 03/24/15 @ 8:30am

## 2015-03-12 NOTE — Telephone Encounter (Signed)
I sent an Rx in this am to [facility] on [address]- (his pharmacy of record)  Kerin Ransom PA-C 03/12/2015 7:56 AM

## 2015-03-24 ENCOUNTER — Encounter: Payer: Medicare Other | Admitting: Physician Assistant

## 2015-04-22 ENCOUNTER — Telehealth: Payer: Self-pay | Admitting: Cardiology

## 2015-04-22 ENCOUNTER — Encounter: Payer: Self-pay | Admitting: Cardiology

## 2015-04-23 NOTE — Telephone Encounter (Signed)
Close encounter 

## 2015-05-04 ENCOUNTER — Encounter
Admission: RE | Admit: 2015-05-04 | Discharge: 2015-05-04 | Disposition: A | Discharge status: Home / Self Care | Payer: Medicare Other | Source: Ambulatory Visit | Attending: Orthopedic Surgery | Admitting: Orthopedic Surgery

## 2015-05-04 DIAGNOSIS — I2510 Atherosclerotic heart disease of native coronary artery without angina pectoris: Secondary | ICD-10-CM | POA: Diagnosis not present

## 2015-05-04 DIAGNOSIS — M179 Osteoarthritis of knee, unspecified: Secondary | ICD-10-CM | POA: Insufficient documentation

## 2015-05-04 DIAGNOSIS — J309 Allergic rhinitis, unspecified: Secondary | ICD-10-CM | POA: Diagnosis not present

## 2015-05-04 DIAGNOSIS — F329 Major depressive disorder, single episode, unspecified: Secondary | ICD-10-CM | POA: Insufficient documentation

## 2015-05-04 DIAGNOSIS — K219 Gastro-esophageal reflux disease without esophagitis: Secondary | ICD-10-CM | POA: Diagnosis not present

## 2015-05-04 DIAGNOSIS — E118 Type 2 diabetes mellitus with unspecified complications: Secondary | ICD-10-CM | POA: Insufficient documentation

## 2015-05-04 DIAGNOSIS — E785 Hyperlipidemia, unspecified: Secondary | ICD-10-CM | POA: Insufficient documentation

## 2015-05-04 DIAGNOSIS — Z01812 Encounter for preprocedural laboratory examination: Secondary | ICD-10-CM | POA: Diagnosis present

## 2015-05-04 DIAGNOSIS — J45909 Unspecified asthma, uncomplicated: Secondary | ICD-10-CM | POA: Diagnosis not present

## 2015-05-04 DIAGNOSIS — F411 Generalized anxiety disorder: Secondary | ICD-10-CM | POA: Insufficient documentation

## 2015-05-04 DIAGNOSIS — J441 Chronic obstructive pulmonary disease with (acute) exacerbation: Secondary | ICD-10-CM | POA: Insufficient documentation

## 2015-05-04 LAB — CBC
HEMATOCRIT: 32.2 % — AB (ref 40.0–52.0)
HEMOGLOBIN: 10.9 g/dL — AB (ref 13.0–18.0)
MCH: 31.3 pg (ref 26.0–34.0)
MCHC: 33.8 g/dL (ref 32.0–36.0)
MCV: 92.7 fL (ref 80.0–100.0)
PLATELETS: 148 10*3/uL — AB (ref 150–440)
RBC: 3.48 MIL/uL — ABNORMAL LOW (ref 4.40–5.90)
RDW: 14.1 % (ref 11.5–14.5)
WBC: 5.7 10*3/uL (ref 3.8–10.6)

## 2015-05-04 LAB — BASIC METABOLIC PANEL
ANION GAP: 7 (ref 5–15)
BUN: 26 mg/dL — AB (ref 6–20)
CALCIUM: 9.5 mg/dL (ref 8.9–10.3)
CO2: 23 mmol/L (ref 22–32)
CREATININE: 2.07 mg/dL — AB (ref 0.61–1.24)
Chloride: 108 mmol/L (ref 101–111)
GFR calc Af Amer: 39 mL/min — ABNORMAL LOW (ref >60)
GFR, EST NON AFRICAN AMERICAN: 34 mL/min — AB (ref >60)
GLUCOSE: 96 mg/dL (ref 65–99)
Potassium: 4.2 mmol/L (ref 3.5–5.1)
Sodium: 138 mmol/L (ref 135–145)

## 2015-05-04 LAB — URINALYSIS COMPLETE WITH MICROSCOPIC (ARMC ONLY)
BILIRUBIN URINE: NEGATIVE
Glucose, UA: NEGATIVE mg/dL
Hgb urine dipstick: NEGATIVE
Ketones, ur: NEGATIVE mg/dL
Leukocytes, UA: NEGATIVE
Nitrite: NEGATIVE
Protein, ur: NEGATIVE mg/dL
SQUAMOUS EPITHELIAL / LPF: NONE SEEN
Specific Gravity, Urine: 1.012 (ref 1.005–1.030)
pH: 5 (ref 5.0–8.0)

## 2015-05-04 LAB — SURGICAL PCR SCREEN
MRSA, PCR: NEGATIVE
Staphylococcus aureus: NEGATIVE

## 2015-05-04 LAB — TYPE AND SCREEN
ABO/RH(D): A POS
ANTIBODY SCREEN: NEGATIVE

## 2015-05-04 LAB — PROTIME-INR
INR: 1.02
PROTHROMBIN TIME: 13.6 s (ref 11.4–15.0)

## 2015-05-04 LAB — HEMOGLOBIN A1C: Hgb A1c MFr Bld: 5.5 % (ref 4.0–6.0)

## 2015-05-04 LAB — APTT: APTT: 30 s (ref 24–36)

## 2015-05-04 LAB — SEDIMENTATION RATE: Sed Rate: 56 mm/hr — ABNORMAL HIGH (ref 0–20)

## 2015-05-04 NOTE — Pre-Procedure Instructions (Signed)
Faxed cardiac clearance to Dr. Glenetta Hew, pt has an appt on 05/11/15 with Dr. Ellyn Hack.  Margaretha Sheffield at Dr. Clydell Hakim office notified of request for cardiac clearance.

## 2015-05-04 NOTE — Patient Instructions (Signed)
  Your procedure is scheduled on: Monday May 17, 2015. Report to Same Day Surgery. To find out your arrival time please call [phone] between 1PM - 3PM on Friday May 14, 2015.  Remember: Instructions that are not followed completely may result in serious medical risk, up to and including death, or upon the discretion of your surgeon and anesthesiologist your surgery may need to be rescheduled.    _x___ 1. Do not eat food or drink liquids after midnight. No gum chewing or hard candies.     ____   2. No Alcohol for 24 hours before or after surgery.   ____   3. Bring all medications with you on the day of surgery if instructed.    _x___ 4. Notify your doctor if there is any change in your medical condition     (cold, fever, infections).     Do not wear jewelry, make-up, hairpins, clips or nail polish.  Do not wear lotions, powders, or perfumes. You may wear deodorant.  Do not shave 48 hours prior to surgery. Men may shave face and neck.  Do not bring valuables to the hospital.    [facility] is not responsible for any belongings or valuables.               Contacts, dentures or bridgework may not be worn into surgery.  Leave your suitcase in the car. After surgery it may be brought to your room.  For patients admitted to the hospital, discharge time is determined by your treatment team.   Patients discharged the day of surgery will not be allowed to drive home.    Please read over the following fact sheets that you were given:   [facility] Preparing for Surgery  _x___ Take these medicines the morning of surgery with A SIP OF WATER:    1. metoprolol tartrate (LOPRESSOR)  2. montelukast (SINGULAIR)    ____ Fleet Enema (as directed)   __x__ Use CHG Soap as directed  _x__ Use inhalers on the day of surgery  ____ Stop metformin 2 days prior to surgery    ____ Take 1/2 of usual insulin dose the night before surgery and none on the morning of surgery.   __x__ Ask  Cardiology about stopping Brilinta and aspirin prior to surgery.  ____ Stop Anti-inflammatories on pt can't take, allergic.   ____ Stop supplements until after surgery.    ____ Bring C-Pap to the hospital.

## 2015-05-05 LAB — C-REACTIVE PROTEIN: CRP: 0.6 mg/dL (ref <1.0)

## 2015-05-06 LAB — URINE CULTURE: CULTURE: NO GROWTH

## 2015-05-10 ENCOUNTER — Ambulatory Visit: Payer: Medicare Other | Admitting: Cardiology

## 2015-05-11 ENCOUNTER — Ambulatory Visit: Payer: Medicare Other | Admitting: Cardiology

## 2015-05-11 ENCOUNTER — Telehealth: Payer: Self-pay | Admitting: Cardiology

## 2015-05-11 NOTE — Telephone Encounter (Signed)
Pt had appt today with Dr. Ellyn Hack that has been r/s until Thursday.  He has knee surgery scheduled for Monday and wants to know if its ok to stop his blood thinner before he sees Dr. Ellyn Hack?States he needs to stop 5 days prior to surgery.

## 2015-05-11 NOTE — Telephone Encounter (Signed)
Spoke to patient  Due to Dr Benita Stabile emergency)patient will see Dr Gwenlyn Found tomorrow for clearance for knee surgery on 05/17/15. Patient verbalized  Dr Ellyn Hack is aware that he needs surgery. The plan was to wait 6-8 weeks out for clearance/and to have  Knee surgery. Patient is voiced understanding appointment tomorrow at 11 am.

## 2015-05-12 ENCOUNTER — Ambulatory Visit (INDEPENDENT_AMBULATORY_CARE_PROVIDER_SITE_OTHER): Payer: Medicare Other | Admitting: Cardiovascular Disease

## 2015-05-12 ENCOUNTER — Telehealth: Payer: Self-pay | Admitting: *Deleted

## 2015-05-12 ENCOUNTER — Encounter: Payer: Self-pay | Admitting: Cardiovascular Disease

## 2015-05-12 VITALS — BP 112/64 | HR 68 | Ht 70.0 in | Wt 184.0 lb

## 2015-05-12 DIAGNOSIS — I2510 Atherosclerotic heart disease of native coronary artery without angina pectoris: Secondary | ICD-10-CM

## 2015-05-12 DIAGNOSIS — I214 Non-ST elevation (NSTEMI) myocardial infarction: Secondary | ICD-10-CM | POA: Diagnosis not present

## 2015-05-12 DIAGNOSIS — Z9861 Coronary angioplasty status: Secondary | ICD-10-CM

## 2015-05-12 NOTE — Assessment & Plan Note (Signed)
Chase Gibson is being seen today for preoperative clearance before a right total knee replacement. He had a non-STEMI 2 months ago and underwent bare metal stenting of his proximal LAD by Dr. Burt Knack. Had mild to moderate CAD in his other vessels. He has been on dual anti-failure therapy including aspirin and Brilenta. He denies chest pain or shortness of breath. I think it's acceptable for him to stop his anti-platelet therapy since his been 2 months since his intervention and to proceed with his orthopedic procedure at low cardiovascular risk. He can restart his intraoperative therapy at a time after his surgery when the surgeon feels it's clinically safe to do so. He will follow-up with Dr. Ellyn Hack in 3 months.

## 2015-05-12 NOTE — Pre-Procedure Instructions (Signed)
Spoke with pt, he was cleared for surgery by cardiologist, pt to stop Brilinta, but continue with baby aspirin.

## 2015-05-12 NOTE — Progress Notes (Signed)
05/12/2015 Chase Gibson   12-30-1955  [id number]  Primary Physician Gara Kroner, MD Primary Cardiologist: Lorretta Harp MD Renae Gloss   HPI:  [name] is a 59 year old mildly overweight divorced Caucasian male father of one child who is currently not working because of pain disability able. He does work as a Biochemist, clinical. He has a history of continued tobacco abuse as well as treated hypertension and hyperlipidemia. He is scheduled to have a right total knee replacement next week. He had a non-STEMI 2 months ago and underwent radial cardiac cath position by Dr. Burt Knack revealing a high-grade proximal LAD lesion which was stented with a bare metal stent. He had noncritical disease in his other vessels. He said note chest pain. He has been on dual anti-platelet  therapy including aspirin Brilenta.   Current Outpatient Prescriptions  Medication Sig Dispense Refill  . albuterol (VENTOLIN HFA) 108 (90 BASE) MCG/ACT inhaler Inhale 1-2 puffs into the lungs every 4 (four) hours as needed for wheezing or shortness of breath.     Marland Kitchen aspirin 81 MG chewable tablet Chew 1 tablet (81 mg total) by mouth daily.    Marland Kitchen atorvastatin (LIPITOR) 80 MG tablet Take 1 tablet (80 mg total) by mouth daily at 6 PM. 90 tablet 3  . HYDROcodone-acetaminophen (NORCO) 10-325 MG per tablet Take 1 tablet by mouth every 6 (six) hours as needed for moderate pain.    Marland Kitchen ipratropium-albuterol (DUONEB) 0.5-2.5 (3) MG/3ML SOLN Take 3 mLs by nebulization every 6 (six) hours as needed (shortness of breath).    . metoprolol tartrate (LOPRESSOR) 25 MG tablet Take 0.5 tablets (12.5 mg total) by mouth 2 (two) times daily. (Patient taking differently: Take 25 mg by mouth every morning. ) 90 tablet 3  . montelukast (SINGULAIR) 10 MG tablet Take 10 mg by mouth once.     . nicotine (NICODERM CQ - DOSED IN MG/24 HOURS) 14 mg/24hr patch Place 1 patch (14 mg total) onto the skin daily. (Patient taking differently:  Place 14 mg onto the skin daily as needed. Pt uses when in hosptial) 28 patch 0  . nitroGLYCERIN (NITROSTAT) 0.4 MG SL tablet Place 1 tablet (0.4 mg total) under the tongue every 5 (five) minutes as needed for chest pain. 25 tablet 2  . QUEtiapine (SEROQUEL) 200 MG tablet Take 800 mg by mouth at bedtime.     . sitaGLIPtin (JANUVIA) 50 MG tablet Take 50 mg by mouth daily.    . ticagrelor (BRILINTA) 90 MG TABS tablet Take 1 tablet (90 mg total) by mouth 2 (two) times daily. 60 tablet 11  . varenicline (CHANTIX) 1 MG tablet Take 1 mg by mouth 2 (two) times daily.     No current facility-administered medications for this visit.    Allergies  Allergen Reactions  . Tramadol Itching  . Nsaids Nausea And Vomiting and Rash    History   Social History  . Marital Status: Divorced    Spouse Name: N/A  . Number of Children: 2  . Years of Education: N/A   Occupational History  . Disabled    Social History Main Topics  . Smoking status: Current Every Day Smoker -- 1.00 packs/day for 35 years    Types: Cigarettes    Last Attempt to Quit: 12/02/2012  . Smokeless tobacco: Never Used  . Alcohol Use: No  . Drug Use: No  . Sexual Activity: Not on file   Other Topics Concern  . Not on file  Social History Narrative     Review of Systems: General: negative for chills, fever, night sweats or weight changes.  Cardiovascular: negative for chest pain, dyspnea on exertion, edema, orthopnea, palpitations, paroxysmal nocturnal dyspnea or shortness of breath Dermatological: negative for rash Respiratory: negative for cough or wheezing Urologic: negative for hematuria Abdominal: negative for nausea, vomiting, diarrhea, bright red blood per rectum, melena, or hematemesis Neurologic: negative for visual changes, syncope, or dizziness All other systems reviewed and are otherwise negative except as noted above.    Blood pressure 112/64, pulse 68, height 5\' 10"  (1.778 m), weight 184 lb (83.462 kg).   General appearance: alert and no distress Neck: no adenopathy, no carotid bruit, no JVD, supple, symmetrical, trachea midline and thyroid not enlarged, symmetric, no tenderness/mass/nodules Lungs: clear to auscultation bilaterally Heart: regular rate and rhythm, S1, S2 normal, no murmur, click, rub or gallop Extremities: extremities normal, atraumatic, no cyanosis or edema  EKG normal/68 without ST or T-wave changes. I personally reviewed this EKG  ASSESSMENT AND PLAN:   NSTEMI, initial episode of care Chase Gibson is being seen today for preoperative clearance before a right total knee replacement. He had a non-STEMI 2 months ago and underwent bare metal stenting of his proximal LAD by Dr. Burt Knack. Had mild to moderate CAD in his other vessels. He has been on dual anti-failure therapy including aspirin and Brilenta. He denies chest pain or shortness of breath. I think it's acceptable for him to stop his anti-platelet therapy since his been 2 months since his intervention and to proceed with his orthopedic procedure at low cardiovascular risk. He can restart his intraoperative therapy at a time after his surgery when the surgeon feels it's clinically safe to do so. He will follow-up with Dr. Ellyn Hack in 3 months.      Lorretta Harp MD FACP,FACC,FAHA, [facility] 05/12/2015 12:12 PM

## 2015-05-12 NOTE — Patient Instructions (Addendum)
You have been cleared for your upcoming knee surgery.   Okay to stop Brilinta  as Psychologist, sport and exercise requested. **Dr Gwenlyn Found recommeds continuing patient's baby aspirin, without interuption.   Dr Gwenlyn Found recommends that you schedule a follow-up appointment with DR [facility] in 3 months.

## 2015-05-12 NOTE — Telephone Encounter (Signed)
INFORMED Chase Gibson - PATIENT WAS SEEN TODAY FOR CLEARANCE SHE WILL BE ABLE TO SEE INFORMATION IN PATIENT'S CHL CHART- CLARIFICATION WITH PATIENT,HE SHOULD CONTINUE TAKING ASPIRIN PRIOR TO SURGERY.  [place] SEES INFORMATION.

## 2015-05-13 ENCOUNTER — Ambulatory Visit: Payer: Medicare Other | Admitting: Cardiology

## 2015-05-16 MED ORDER — CEFAZOLIN SODIUM-DEXTROSE 2-3 GM-% IV SOLR
2.0000 g | INTRAVENOUS | Status: AC
  Administered 2015-05-17: 2 g via INTRAVENOUS

## 2015-05-17 ENCOUNTER — Encounter
Admission: RE | Disposition: A | Discharge status: Home / Self Care | Payer: Self-pay | Source: Ambulatory Visit | Attending: Orthopedic Surgery

## 2015-05-17 ENCOUNTER — Inpatient Hospital Stay: Payer: Medicare Other | Admitting: Anesthesiology

## 2015-05-17 ENCOUNTER — Inpatient Hospital Stay: Payer: Medicare Other

## 2015-05-17 ENCOUNTER — Encounter: Payer: Self-pay | Admitting: Orthopedic Surgery

## 2015-05-17 ENCOUNTER — Inpatient Hospital Stay
Admission: RE | Admit: 2015-05-17 | Discharge: 2015-05-19 | DRG: 470 | Disposition: A | Discharge status: Home / Self Care | Payer: Medicare Other | Source: Ambulatory Visit | Attending: Orthopedic Surgery | Admitting: Orthopedic Surgery

## 2015-05-17 DIAGNOSIS — Z9049 Acquired absence of other specified parts of digestive tract: Secondary | ICD-10-CM | POA: Diagnosis present

## 2015-05-17 DIAGNOSIS — M1990 Unspecified osteoarthritis, unspecified site: Secondary | ICD-10-CM | POA: Diagnosis present

## 2015-05-17 DIAGNOSIS — I252 Old myocardial infarction: Secondary | ICD-10-CM | POA: Diagnosis not present

## 2015-05-17 DIAGNOSIS — J449 Chronic obstructive pulmonary disease, unspecified: Secondary | ICD-10-CM | POA: Diagnosis present

## 2015-05-17 DIAGNOSIS — J45909 Unspecified asthma, uncomplicated: Secondary | ICD-10-CM | POA: Diagnosis present

## 2015-05-17 DIAGNOSIS — Z96659 Presence of unspecified artificial knee joint: Secondary | ICD-10-CM

## 2015-05-17 DIAGNOSIS — Z79891 Long term (current) use of opiate analgesic: Secondary | ICD-10-CM | POA: Diagnosis not present

## 2015-05-17 DIAGNOSIS — Z7982 Long term (current) use of aspirin: Secondary | ICD-10-CM | POA: Diagnosis not present

## 2015-05-17 DIAGNOSIS — Z888 Allergy status to other drugs, medicaments and biological substances status: Secondary | ICD-10-CM

## 2015-05-17 DIAGNOSIS — Z955 Presence of coronary angioplasty implant and graft: Secondary | ICD-10-CM

## 2015-05-17 DIAGNOSIS — F411 Generalized anxiety disorder: Secondary | ICD-10-CM | POA: Diagnosis present

## 2015-05-17 DIAGNOSIS — D62 Acute posthemorrhagic anemia: Secondary | ICD-10-CM | POA: Diagnosis not present

## 2015-05-17 DIAGNOSIS — B9562 Methicillin resistant Staphylococcus aureus infection as the cause of diseases classified elsewhere: Secondary | ICD-10-CM | POA: Diagnosis present

## 2015-05-17 DIAGNOSIS — K219 Gastro-esophageal reflux disease without esophagitis: Secondary | ICD-10-CM | POA: Diagnosis present

## 2015-05-17 DIAGNOSIS — E785 Hyperlipidemia, unspecified: Secondary | ICD-10-CM | POA: Diagnosis present

## 2015-05-17 DIAGNOSIS — Z8261 Family history of arthritis: Secondary | ICD-10-CM | POA: Diagnosis not present

## 2015-05-17 DIAGNOSIS — F418 Other specified anxiety disorders: Secondary | ICD-10-CM | POA: Diagnosis present

## 2015-05-17 DIAGNOSIS — Z8614 Personal history of Methicillin resistant Staphylococcus aureus infection: Secondary | ICD-10-CM

## 2015-05-17 DIAGNOSIS — Z7951 Long term (current) use of inhaled steroids: Secondary | ICD-10-CM

## 2015-05-17 DIAGNOSIS — Z886 Allergy status to analgesic agent status: Secondary | ICD-10-CM

## 2015-05-17 DIAGNOSIS — Z86010 Personal history of colonic polyps: Secondary | ICD-10-CM | POA: Diagnosis not present

## 2015-05-17 DIAGNOSIS — I2510 Atherosclerotic heart disease of native coronary artery without angina pectoris: Secondary | ICD-10-CM | POA: Diagnosis present

## 2015-05-17 DIAGNOSIS — N183 Chronic kidney disease, stage 3 (moderate): Secondary | ICD-10-CM | POA: Diagnosis present

## 2015-05-17 DIAGNOSIS — T8453XA Infection and inflammatory reaction due to internal right knee prosthesis, initial encounter: Principal | ICD-10-CM | POA: Diagnosis present

## 2015-05-17 DIAGNOSIS — K5090 Crohn's disease, unspecified, without complications: Secondary | ICD-10-CM | POA: Diagnosis present

## 2015-05-17 DIAGNOSIS — Z79899 Other long term (current) drug therapy: Secondary | ICD-10-CM | POA: Diagnosis not present

## 2015-05-17 DIAGNOSIS — E1121 Type 2 diabetes mellitus with diabetic nephropathy: Secondary | ICD-10-CM | POA: Diagnosis present

## 2015-05-17 HISTORY — PX: TOTAL KNEE ARTHROPLASTY: SHX125

## 2015-05-17 LAB — GLUCOSE, CAPILLARY: Glucose-Capillary: 135 mg/dL — ABNORMAL HIGH (ref 65–99)

## 2015-05-17 LAB — SYNOVIAL CELL COUNT + DIFF, W/ CRYSTALS
CRYSTALS FLUID: NONE SEEN
Eosinophils-Synovial: 0 %
Lymphocytes-Synovial Fld: 12 %
Monocyte-Macrophage-Synovial Fluid: 21 %
Neutrophil, Synovial: 67 %
OTHER CELLS-SYN: 0
WBC, Synovial: 2544 /mm3 — ABNORMAL HIGH (ref 0–200)

## 2015-05-17 LAB — POTASSIUM: POTASSIUM: 4.3 mmol/L (ref 3.5–5.1)

## 2015-05-17 SURGERY — ARTHROPLASTY, KNEE, TOTAL
Anesthesia: Spinal | Site: Knee | Laterality: Right | Wound class: Clean

## 2015-05-17 MED ORDER — DIPHENHYDRAMINE HCL 12.5 MG/5ML PO ELIX: 12.5000 mg | ORAL_SOLUTION | ORAL | Status: DC | PRN

## 2015-05-17 MED ORDER — METOPROLOL TARTRATE 25 MG PO TABS
25.0000 mg | ORAL_TABLET | ORAL | Status: DC
  Filled 2015-05-17: qty 1

## 2015-05-17 MED ORDER — ONDANSETRON HCL 4 MG/2ML IJ SOLN
4.0000 mg | Freq: Four times a day (QID) | INTRAMUSCULAR | Status: DC | PRN
Start: 2015-05-17 — End: 2015-05-19
  Administered 2015-05-18: 4 mg via INTRAVENOUS
  Filled 2015-05-17: qty 2

## 2015-05-17 MED ORDER — QUETIAPINE FUMARATE 300 MG PO TABS
800.0000 mg | ORAL_TABLET | Freq: Every day | ORAL | Status: DC
  Administered 2015-05-17 – 2015-05-18 (×2): 800 mg via ORAL
  Filled 2015-05-17 (×2): qty 2

## 2015-05-17 MED ORDER — METOCLOPRAMIDE HCL 10 MG PO TABS
10.0000 mg | ORAL_TABLET | Freq: Three times a day (TID) | ORAL | Status: AC
  Administered 2015-05-17 – 2015-05-19 (×8): 10 mg via ORAL
  Filled 2015-05-17 (×8): qty 1

## 2015-05-17 MED ORDER — TOBRAMYCIN SULFATE 1.2 G IJ SOLR
INTRAMUSCULAR | Status: AC
  Filled 2015-05-17: qty 3.6

## 2015-05-17 MED ORDER — SODIUM CHLORIDE 0.9 % IV SOLN
INTRAVENOUS | Status: DC
  Administered 2015-05-17 – 2015-05-18 (×2): via INTRAVENOUS

## 2015-05-17 MED ORDER — ACETAMINOPHEN 10 MG/ML IV SOLN
INTRAVENOUS | Status: AC
  Filled 2015-05-17: qty 100

## 2015-05-17 MED ORDER — FERROUS SULFATE 325 (65 FE) MG PO TABS
325.0000 mg | ORAL_TABLET | Freq: Two times a day (BID) | ORAL | Status: DC
  Administered 2015-05-17 – 2015-05-19 (×4): 325 mg via ORAL
  Filled 2015-05-17 (×4): qty 1

## 2015-05-17 MED ORDER — MONTELUKAST SODIUM 10 MG PO TABS
10.0000 mg | ORAL_TABLET | Freq: Once | ORAL | Status: AC
  Administered 2015-05-17: 10 mg via ORAL
  Filled 2015-05-17: qty 1

## 2015-05-17 MED ORDER — IPRATROPIUM-ALBUTEROL 0.5-2.5 (3) MG/3ML IN SOLN: 3.0000 mL | Freq: Four times a day (QID) | RESPIRATORY_TRACT | Status: DC | PRN

## 2015-05-17 MED ORDER — FENTANYL CITRATE (PF) 100 MCG/2ML IJ SOLN
INTRAMUSCULAR | Status: DC | PRN
  Administered 2015-05-17: 50 ug via INTRAVENOUS

## 2015-05-17 MED ORDER — METOPROLOL TARTRATE 25 MG PO TABS
25.0000 mg | ORAL_TABLET | Freq: Once | ORAL | Status: AC
  Administered 2015-05-17: 25 mg via ORAL

## 2015-05-17 MED ORDER — ALUM & MAG HYDROXIDE-SIMETH 200-200-20 MG/5ML PO SUSP: 30.0000 mL | ORAL | Status: DC | PRN

## 2015-05-17 MED ORDER — BISACODYL 10 MG RE SUPP: 10.0000 mg | Freq: Every day | RECTAL | Status: DC | PRN

## 2015-05-17 MED ORDER — ATORVASTATIN CALCIUM 20 MG PO TABS
80.0000 mg | ORAL_TABLET | Freq: Every day | ORAL | Status: DC
  Administered 2015-05-17 – 2015-05-18 (×2): 80 mg via ORAL
  Filled 2015-05-17 (×2): qty 4

## 2015-05-17 MED ORDER — MENTHOL 3 MG MT LOZG: 1.0000 | LOZENGE | OROMUCOSAL | Status: DC | PRN

## 2015-05-17 MED ORDER — ENOXAPARIN SODIUM 30 MG/0.3ML ~~LOC~~ SOLN
30.0000 mg | Freq: Two times a day (BID) | SUBCUTANEOUS | Status: DC
  Administered 2015-05-18 – 2015-05-19 (×3): 30 mg via SUBCUTANEOUS
  Filled 2015-05-17 (×3): qty 0.3

## 2015-05-17 MED ORDER — PROPOFOL INFUSION 10 MG/ML OPTIME
INTRAVENOUS | Status: DC | PRN
  Administered 2015-05-17: 10:00:00 via INTRAVENOUS
  Administered 2015-05-17: 75 ug/kg/min via INTRAVENOUS

## 2015-05-17 MED ORDER — ACETAMINOPHEN 650 MG RE SUPP: 650.0000 mg | Freq: Four times a day (QID) | RECTAL | Status: DC | PRN

## 2015-05-17 MED ORDER — ACETAMINOPHEN 10 MG/ML IV SOLN
INTRAVENOUS | Status: DC | PRN
  Administered 2015-05-17: 1000 mg via INTRAVENOUS

## 2015-05-17 MED ORDER — CHLORHEXIDINE GLUCONATE 4 % EX LIQD: 60.0000 mL | Freq: Once | CUTANEOUS | Status: DC

## 2015-05-17 MED ORDER — MORPHINE SULFATE 2 MG/ML IJ SOLN
2.0000 mg | INTRAMUSCULAR | Status: DC | PRN
  Administered 2015-05-17: 4 mg via INTRAVENOUS
  Administered 2015-05-17 (×2): 2 mg via INTRAVENOUS
  Filled 2015-05-17: qty 1
  Filled 2015-05-17: qty 2
  Filled 2015-05-17: qty 1

## 2015-05-17 MED ORDER — MINERAL OIL LIGHT 100 % EX OIL
TOPICAL_OIL | CUTANEOUS | Status: AC
Start: 2015-05-17 — End: 2015-05-17
  Filled 2015-05-17: qty 25

## 2015-05-17 MED ORDER — EPHEDRINE SULFATE 50 MG/ML IJ SOLN
INTRAMUSCULAR | Status: DC | PRN
  Administered 2015-05-17 (×2): 10 mg via INTRAVENOUS

## 2015-05-17 MED ORDER — ALBUTEROL SULFATE (2.5 MG/3ML) 0.083% IN NEBU: 2.5000 mg | INHALATION_SOLUTION | RESPIRATORY_TRACT | Status: DC | PRN

## 2015-05-17 MED ORDER — BUPIVACAINE HCL (PF) 0.5 % IJ SOLN
INTRAMUSCULAR | Status: DC | PRN
  Administered 2015-05-17: 2 mL via INTRATHECAL

## 2015-05-17 MED ORDER — TETRACAINE HCL 1 % IJ SOLN
INTRAMUSCULAR | Status: AC
  Filled 2015-05-17: qty 2

## 2015-05-17 MED ORDER — VARENICLINE TARTRATE 1 MG PO TABS: 1.0000 mg | ORAL_TABLET | Freq: Two times a day (BID) | ORAL | Status: DC

## 2015-05-17 MED ORDER — METOPROLOL TARTRATE 25 MG PO TABS
ORAL_TABLET | ORAL | Status: AC
  Administered 2015-05-17: 25 mg via ORAL
  Filled 2015-05-17: qty 1

## 2015-05-17 MED ORDER — BUPIVACAINE-EPINEPHRINE (PF) 0.25% -1:200000 IJ SOLN
INTRAMUSCULAR | Status: AC
  Filled 2015-05-17: qty 30

## 2015-05-17 MED ORDER — NICOTINE 14 MG/24HR TD PT24
14.0000 mg | MEDICATED_PATCH | Freq: Every day | TRANSDERMAL | Status: DC
  Administered 2015-05-17 – 2015-05-19 (×3): 14 mg via TRANSDERMAL
  Filled 2015-05-17 (×3): qty 1

## 2015-05-17 MED ORDER — CEFAZOLIN SODIUM-DEXTROSE 2-3 GM-% IV SOLR
2.0000 g | Freq: Four times a day (QID) | INTRAVENOUS | Status: AC
  Administered 2015-05-17 – 2015-05-18 (×4): 2 g via INTRAVENOUS
  Filled 2015-05-17 (×4): qty 50

## 2015-05-17 MED ORDER — ASPIRIN 81 MG PO CHEW
81.0000 mg | CHEWABLE_TABLET | Freq: Every day | ORAL | Status: DC
Start: 2015-05-17 — End: 2015-05-19
  Administered 2015-05-17 – 2015-05-19 (×3): 81 mg via ORAL
  Filled 2015-05-17 (×3): qty 1

## 2015-05-17 MED ORDER — PANTOPRAZOLE SODIUM 40 MG PO TBEC
40.0000 mg | DELAYED_RELEASE_TABLET | Freq: Two times a day (BID) | ORAL | Status: DC
  Administered 2015-05-17 – 2015-05-19 (×5): 40 mg via ORAL
  Filled 2015-05-17 (×6): qty 1

## 2015-05-17 MED ORDER — VANCOMYCIN HCL 1000 MG IV SOLR
INTRAVENOUS | Status: AC
  Filled 2015-05-17: qty 3000

## 2015-05-17 MED ORDER — ACETAMINOPHEN 10 MG/ML IV SOLN
1000.0000 mg | Freq: Four times a day (QID) | INTRAVENOUS | Status: AC
  Administered 2015-05-17 – 2015-05-18 (×3): 1000 mg via INTRAVENOUS
  Filled 2015-05-17 (×4): qty 100

## 2015-05-17 MED ORDER — BUPIVACAINE-EPINEPHRINE 0.25% -1:200000 IJ SOLN
INTRAMUSCULAR | Status: DC | PRN
  Administered 2015-05-17: 30 mL

## 2015-05-17 MED ORDER — TETRACAINE HCL 1 % IJ SOLN
INTRAMUSCULAR | Status: DC | PRN
  Administered 2015-05-17: 8 mg via INTRASPINAL

## 2015-05-17 MED ORDER — BUPIVACAINE LIPOSOME 1.3 % IJ SUSP
INTRAMUSCULAR | Status: AC
  Filled 2015-05-17: qty 20

## 2015-05-17 MED ORDER — NEOMYCIN-POLYMYXIN B GU 40-200000 IR SOLN
Status: AC
  Filled 2015-05-17: qty 20

## 2015-05-17 MED ORDER — SODIUM CHLORIDE 0.9 % IJ SOLN
INTRAMUSCULAR | Status: AC
  Filled 2015-05-17: qty 50

## 2015-05-17 MED ORDER — ALBUTEROL SULFATE HFA 108 (90 BASE) MCG/ACT IN AERS: 1.0000 | INHALATION_SPRAY | RESPIRATORY_TRACT | Status: DC | PRN

## 2015-05-17 MED ORDER — NEOMYCIN-POLYMYXIN B GU 40-200000 IR SOLN
Status: DC | PRN
  Administered 2015-05-17: 14 mL

## 2015-05-17 MED ORDER — ACETAMINOPHEN 325 MG PO TABS: 650.0000 mg | ORAL_TABLET | Freq: Four times a day (QID) | ORAL | Status: DC | PRN

## 2015-05-17 MED ORDER — ONDANSETRON HCL 4 MG PO TABS: 4.0000 mg | ORAL_TABLET | Freq: Four times a day (QID) | ORAL | Status: DC | PRN

## 2015-05-17 MED ORDER — SODIUM CHLORIDE 0.9 % IV BOLUS (SEPSIS)
1000.0000 mL | Freq: Once | INTRAVENOUS | Status: AC
  Administered 2015-05-17: 1000 mL via INTRAVENOUS

## 2015-05-17 MED ORDER — KETAMINE HCL 50 MG/ML IJ SOLN
INTRAMUSCULAR | Status: DC | PRN
  Administered 2015-05-17: 50 mg via INTRAVENOUS

## 2015-05-17 MED ORDER — FLEET ENEMA 7-19 GM/118ML RE ENEM: 1.0000 | ENEMA | Freq: Once | RECTAL | Status: AC | PRN

## 2015-05-17 MED ORDER — ONDANSETRON HCL 4 MG/2ML IJ SOLN: 4.0000 mg | Freq: Once | INTRAMUSCULAR | Status: DC | PRN

## 2015-05-17 MED ORDER — MIDAZOLAM HCL 5 MG/5ML IJ SOLN
INTRAMUSCULAR | Status: DC | PRN
  Administered 2015-05-17: 2 mg via INTRAVENOUS

## 2015-05-17 MED ORDER — OXYCODONE HCL 5 MG PO TABS
5.0000 mg | ORAL_TABLET | ORAL | Status: DC | PRN
  Administered 2015-05-17: 5 mg via ORAL
  Administered 2015-05-17: 10 mg via ORAL
  Administered 2015-05-17: 5 mg via ORAL
  Administered 2015-05-18 – 2015-05-19 (×6): 10 mg via ORAL
  Filled 2015-05-17 (×2): qty 2
  Filled 2015-05-17 (×2): qty 1
  Filled 2015-05-17 (×2): qty 2
  Filled 2015-05-17: qty 1
  Filled 2015-05-17 (×3): qty 2

## 2015-05-17 MED ORDER — SENNOSIDES-DOCUSATE SODIUM 8.6-50 MG PO TABS
1.0000 | ORAL_TABLET | Freq: Two times a day (BID) | ORAL | Status: DC
  Administered 2015-05-17 – 2015-05-19 (×4): 1 via ORAL
  Filled 2015-05-17 (×5): qty 1

## 2015-05-17 MED ORDER — FAMOTIDINE 20 MG PO TABS
20.0000 mg | ORAL_TABLET | Freq: Once | ORAL | Status: AC
  Administered 2015-05-17: 20 mg via ORAL

## 2015-05-17 MED ORDER — MAGNESIUM HYDROXIDE 400 MG/5ML PO SUSP: 30.0000 mL | Freq: Every day | ORAL | Status: DC | PRN

## 2015-05-17 MED ORDER — LIDOCAINE HCL (PF) 2 % IJ SOLN
INTRAMUSCULAR | Status: DC | PRN
  Administered 2015-05-17: 50 mg

## 2015-05-17 MED ORDER — LIDOCAINE HCL 2 % EX GEL
CUTANEOUS | Status: DC | PRN
  Administered 2015-05-17: 1 via TOPICAL

## 2015-05-17 MED ORDER — CEFAZOLIN SODIUM-DEXTROSE 2-3 GM-% IV SOLR
INTRAVENOUS | Status: AC
  Filled 2015-05-17: qty 50

## 2015-05-17 MED ORDER — PHENYLEPHRINE HCL 10 MG/ML IJ SOLN
INTRAMUSCULAR | Status: DC | PRN
Start: 2015-05-17 — End: 2015-05-17
  Administered 2015-05-17 (×2): 100 ug via INTRAVENOUS

## 2015-05-17 MED ORDER — LINAGLIPTIN 5 MG PO TABS
5.0000 mg | ORAL_TABLET | Freq: Every day | ORAL | Status: DC
  Administered 2015-05-17 – 2015-05-19 (×3): 5 mg via ORAL
  Filled 2015-05-17 (×3): qty 1

## 2015-05-17 MED ORDER — NITROGLYCERIN 0.4 MG SL SUBL: 0.4000 mg | SUBLINGUAL_TABLET | SUBLINGUAL | Status: DC | PRN

## 2015-05-17 MED ORDER — SODIUM CHLORIDE 0.9 % IV SOLN
INTRAVENOUS | Status: DC
  Administered 2015-05-17 (×3): via INTRAVENOUS

## 2015-05-17 MED ORDER — FENTANYL CITRATE (PF) 100 MCG/2ML IJ SOLN: 25.0000 ug | INTRAMUSCULAR | Status: DC | PRN

## 2015-05-17 MED ORDER — SODIUM CHLORIDE 0.9 % IV SOLN
INTRAVENOUS | Status: DC | PRN
  Administered 2015-05-17: 60 mL

## 2015-05-17 MED ORDER — GLYCOPYRROLATE 0.2 MG/ML IJ SOLN
INTRAMUSCULAR | Status: DC | PRN
  Administered 2015-05-17: 0.2 mg via INTRAVENOUS

## 2015-05-17 MED ORDER — FAMOTIDINE 20 MG PO TABS
ORAL_TABLET | ORAL | Status: AC
  Administered 2015-05-17: 20 mg via ORAL
  Filled 2015-05-17: qty 1

## 2015-05-17 MED ORDER — PHENOL 1.4 % MT LIQD: 1.0000 | OROMUCOSAL | Status: DC | PRN

## 2015-05-17 SURGICAL SUPPLY — 75 items
ADAPTER BOLT FEMORAL +2/-2 (Knees) ×2 IMPLANT
ADPR FEM +2/-2 OFST BOLT (Knees) ×1 IMPLANT
ADPR FEM 5D STRL KN PFC SGM (Orthopedic Implant) ×1 IMPLANT
AUG FEM SZ3 4 STRL LF KN RT TI (Knees) ×2 IMPLANT
AUTOTRANSFUS HAS 1/8 (MISCELLANEOUS) ×2
BATTERY INSTRU NAVIGATION (MISCELLANEOUS) ×8 IMPLANT
BLADE SAW 1 (BLADE) ×4 IMPLANT
BLADE SAW 1/2 (BLADE) ×2 IMPLANT
BONE CEMENT GENTAMICIN (Cement) ×4 IMPLANT
BTRY SRG DRVR LF (MISCELLANEOUS) ×4
CANISTER SUCT 1200ML W/VALVE (MISCELLANEOUS) ×2 IMPLANT
CANISTER SUCT 3000ML (MISCELLANEOUS) ×4 IMPLANT
CATH TRAY 16F METER LATEX (MISCELLANEOUS) ×2 IMPLANT
CEMENT BONE GENTAMICIN 40 (Cement) ×2 IMPLANT
COMP FEM CEM RT SZ3 (Orthopedic Implant) ×2 IMPLANT
COMPONENT FEM CEM RT SZ3 (Orthopedic Implant) ×1 IMPLANT
COOLER POLAR GLACIER W/PUMP (MISCELLANEOUS) ×2 IMPLANT
DISTAL WEDGE PFC 4MM RIGHT (Knees) ×4 IMPLANT
DRAPE IMP U-DRAPE 54X76 (DRAPES) ×2 IMPLANT
DRAPE SHEET LG 3/4 BI-LAMINATE (DRAPES) ×2 IMPLANT
DRSG DERMACEA 8X12 NADH (GAUZE/BANDAGES/DRESSINGS) ×2 IMPLANT
DRSG OPSITE POSTOP 4X14 (GAUZE/BANDAGES/DRESSINGS) ×2 IMPLANT
DURAPREP 26ML APPLICATOR (WOUND CARE) ×2 IMPLANT
ELECT CAUTERY BLADE 6.4 (BLADE) ×2 IMPLANT
EX-PIN ORTHOLOCK NAV 4X150 (PIN) ×4 IMPLANT
FEMORAL ADAPTER (Orthopedic Implant) ×2 IMPLANT
GLOVE BIOGEL M STRL SZ7.5 (GLOVE) ×4 IMPLANT
GLOVE INDICATOR 8.0 STRL GRN (GLOVE) ×2 IMPLANT
GLOVE SURG 9.0 ORTHO LTXF (GLOVE) ×2 IMPLANT
GLOVE SURG ORTHO 9.0 STRL STRW (GLOVE) ×2 IMPLANT
GOWN STRL REUS W/ TWL LRG LVL3 (GOWN DISPOSABLE) ×1 IMPLANT
GOWN STRL REUS W/TWL 2XL LVL3 (GOWN DISPOSABLE) ×2 IMPLANT
GOWN STRL REUS W/TWL LRG LVL3 (GOWN DISPOSABLE) ×2
GOWN STRL REUS W/TWL XL LVL4 (GOWN DISPOSABLE) ×2 IMPLANT
HANDPIECE SUCTION TUBG SURGILV (MISCELLANEOUS) ×2 IMPLANT
HANDPIECE VERSAJET DEBRIDEMENT (MISCELLANEOUS) ×2 IMPLANT
HOLDER FOLEY CATH W/STRAP (MISCELLANEOUS) ×2 IMPLANT
HOOD PEEL AWAY FACE SHEILD DIS (HOOD) ×4 IMPLANT
INSERT TIBIAL TC3 15.0 (Knees) ×2 IMPLANT
KNIFE SCULPS 14X20 (INSTRUMENTS) ×2 IMPLANT
NDL SAFETY 18GX1.5 (NEEDLE) ×2 IMPLANT
NEEDLE SPNL 20GX3.5 QUINCKE YW (NEEDLE) ×4 IMPLANT
NS IRRIG 500ML POUR BTL (IV SOLUTION) ×2 IMPLANT
PACK TOTAL KNEE (MISCELLANEOUS) ×2 IMPLANT
PAD GROUND ADULT SPLIT (MISCELLANEOUS) ×2 IMPLANT
PAD WRAPON POLAR KNEE (MISCELLANEOUS) ×1 IMPLANT
PIN FIXATION 1/8DIA X 3INL (PIN) ×2 IMPLANT
POST AVE PFC 4MM (Knees) ×2 IMPLANT
SLEEVE MBT PROUS ML 29MM (Knees) ×2 IMPLANT
SLEEVE UNIV FEM DIST PRO SZ 31 (Sleeve) ×2 IMPLANT
SOL .9 NS 3000ML IRR  AL (IV SOLUTION) ×1
SOL .9 NS 3000ML IRR AL (IV SOLUTION) ×1
SOL .9 NS 3000ML IRR UROMATIC (IV SOLUTION) ×1 IMPLANT
SOL PREP PVP 2OZ (MISCELLANEOUS) ×2
SOLUTION PREP PVP 2OZ (MISCELLANEOUS) ×1 IMPLANT
SPONGE DRAIN TRACH 4X4 STRL 2S (GAUZE/BANDAGES/DRESSINGS) ×2 IMPLANT
STAPLER SKIN PROX 35W (STAPLE) ×2 IMPLANT
STEM UNIVERSAL REVISION 75X12 (Stem) ×2 IMPLANT
STEM UNIVERSAL REVISION 75X14 (Stem) ×2 IMPLANT
STRAP SAFETY BODY (MISCELLANEOUS) ×2 IMPLANT
SUCTION FRAZIER TIP 10 FR DISP (SUCTIONS) ×2 IMPLANT
SUT VIC AB 0 CT1 36 (SUTURE) IMPLANT
SUT VIC AB 1 CT1 36 (SUTURE) IMPLANT
SUT VIC AB 2-0 CT2 27 (SUTURE) IMPLANT
SWAB DUAL CULTURE TRANS RED ST (MISCELLANEOUS) ×10 IMPLANT
SYR 20CC LL (SYRINGE) ×2 IMPLANT
SYR 30ML LL (SYRINGE) ×2 IMPLANT
SYR 50ML LL SCALE MARK (SYRINGE) ×2 IMPLANT
SYSTEM AUTOTRANSFUS DUAL TROCR (MISCELLANEOUS) ×1 IMPLANT
TOWEL OR 17X26 4PK STRL BLUE (TOWEL DISPOSABLE) ×2 IMPLANT
TOWER CARTRIDGE SMART MIX (DISPOSABLE) ×2 IMPLANT
TRAY REVISION SZ 3 (Knees) ×2 IMPLANT
WATER STERILE IRR 1000ML POUR (IV SOLUTION) IMPLANT
WEDGE DISTAL PFC RIGHT 4MM (Knees) ×2 IMPLANT
WRAPON POLAR PAD KNEE (MISCELLANEOUS) ×2

## 2015-05-17 NOTE — Care Management Note (Addendum)
Case Management Note  Patient Details  Name: Chase Gibson MRN: [id number] Date of Birth: 12/22/1955  Subjective/Objective:      59yo Mr Chase Gibson was admitted 05/17/15 and received a right total knee arthroscopy same day. Has a rolling walker and stated that he does not want a bedside commode. Mr Chase Gibson was vague about number of people in his household and would only say "between one and two. Yes I have someone who can help look after me." PCP=Dr Rockwell Germany in [place]. Pharmacy= [facility] on [address] in [place].  Reports that he has someone to take him to appointments.  [facility] chose [name] to be his home health provider. He has used them in the past. Case management will follow for discharge planning.          Action/Plan:   Expected Discharge Date:                  Expected Discharge Plan:     In-House Referral:     Discharge planning Services     Post Acute Care Choice:    Choice offered to:     DME Arranged:    DME Agency:     HH Arranged:    [place] Agency:     Status of Service:     Medicare Important Message Given:    Date Medicare IM Given:    Medicare IM give by:    Date Additional Medicare IM Given:    Additional Medicare Important Message give by:     If discussed at [place] of Stay Meetings, dates discussed:    Additional Comments:  Breda Bond A, RN 05/17/2015, 3:25 PM

## 2015-05-17 NOTE — Plan of Care (Signed)
Problem: Consults Goal: Diagnosis- Total Joint Replacement Outcome: Progressing Primary Total Knee     

## 2015-05-17 NOTE — H&P (Signed)
The patient has been re-examined, and the chart reviewed, and there have been no interval changes to the documented history and physical.    The risks, benefits, and alternatives have been discussed at length. The patient expressed understanding of the risks benefits and agreed with plans for surgical intervention.  We again discussed in detail the plan for removal of the cement spacer, obtaining tissue and swabs for Gram stain/cultures, and proceeding with total knee arthroplasty. The possibility of residual infection was discussed and the possible need for debridement and reinsertion of antibiotic cement spacer was explained.  I have discussed the patient's status with Dr. Gwenlyn Found (his Cardiologist) who cleared the patient for surgery. At Dr. Kennon Holter request, the patient will continue with ASA 81mg  daily and the Brilinta will be restarted when he completes the 14 day course of Lovenox.  The patient has again been encouraged to stop smoking due to the detrimental effect on wound healing as well as the cardiac issues.  Waneta Fitting P. Holley Bouche M.D.

## 2015-05-17 NOTE — Transfer of Care (Signed)
Immediate Anesthesia Transfer of Care Note  Patient: Chase Gibson  Procedure(s) Performed: Procedure(s): TOTAL KNEE ARTHROPLASTY, REMOVAL OF SPACER (Right)  Patient Location: PACU  Anesthesia Type:MAC and Spinal  Level of Consciousness: sedated  Airway & Oxygen Therapy: Patient Spontanous Breathing and Patient connected to face mask oxygen  Post-op Assessment: Report given to RN  Post vital signs: Reviewed  Last Vitals:  Filed Vitals:   05/17/15 1248  BP: 98/62  Pulse: 64  Temp: 37.8 C  Resp:     Complications: No apparent anesthesia complications

## 2015-05-17 NOTE — Op Note (Signed)
OPERATIVE NOTE  DATE OF SURGERY:  05/17/2015  PATIENT NAME:  Chase Gibson   DOB: [birth year]  MRN: [id number]  PRE-OPERATIVE DIAGNOSIS: Status post removal of right total knee implants, debridement, and placement of an antibiotic impregnated cement spacer for a periprosthetic infection  POST-OPERATIVE DIAGNOSIS:  Same  PROCEDURE:  Removal of the antibiotic impregnated spacer from the right knee, debridement, and right total knee arthroplasty  SURGEON:  Marciano Sequin. M.D.  ASSISTANT:  Vance Peper, PA (present and scrubbed throughout the case, critical for assistance with exposure, retraction, instrumentation, and closure)  ANESTHESIA: spinal  ESTIMATED BLOOD LOSS: 300 mL  FLUIDS REPLACED: 2100 mL of crystalloid  TOURNIQUET TIME: 1 - 120 minutes   2 - 42 minutes  DRAINS: 2 medium Hemovac drains   IMPLANTS UTILIZED: DePuy PFC Sigma TC3 size 3 femoral component (cemented), +2 PFC Sigma femoral adapter bolt, 4 mm medial and lateral distal augments, 4 mm 4 mm lateral posterior augment, 5 femoral adapter, 31 mm universal femoral sleeve, 75 mm x 14 mm universal fluted stem, size 3 MBT revision tibial component (cemented), 29 mm MBT revision metaphyseal sleeve, 75 mm x 12 mm universal fluted stem, and a 15 mm TC3 rotating platform polyethylene insert.  INDICATIONS FOR SURGERY: Chase Gibson is a 59 y.o. year old male who underwent a right total knee arthroplasty several years ago in [place]. 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 In February 2016 he returned to the operating room and underwent a second debridement and reinsertion of antibiotic impregnated cement spacer due to a positive intraoperative Gram stain. He has subsequently completed a course of IV antibiotics with normalization of his C-reactive protein. After discussion of the  risks and benefits of surgical intervention, the patient expressed understanding of the risks benefits and agree with plans for removal of the cement spacer, debridement, and right total knee arthroplasty.   The risks, benefits, and alternatives were discussed at length including but not limited to the risks of infection, bleeding, nerve injury, stiffness, blood clots, the need for revision surgery, cardiopulmonary complications, among others, and they were willing to proceed.  PROCEDURE IN DETAIL: The patient was brought into the operating room and, after adequate spinal anesthesia was achieved, a tourniquet was placed on the patient's upper thigh. The patient's knee and leg were cleaned and prepped with alcohol and DuraPrep and draped in the usual sterile fashion. A "timeout" was performed as per usual protocol. The lower extremity was exsanguinated by elevation, and the tourniquet was inflated to 300 mmHg. curvilinear incision was made in line with the previous surgical scar. Prior to performing a medial parapatellar arthrotomy, approximately 3 cc of fluid was aspirated from the knee and submitted for complete cell count, stat Gram stain, and cultures. The deep fibers of the medial collateral ligament were elevated in a subperiosteal fashion off of the medial flare of the tibia so as to maintain a continuous soft tissue sleeve. The suprapatellar pouch, medial and lateral gutters were then reestablished by sharp debridement of abundant fibrous tissue. Swabs were then obtained from these areas and submitted for stat Gram stain and cultures The patella was subluxed laterally. The interfaces around the cement spacer were debrided using a combination of curettes and rongeurs. Cement splitting instruments were used to free the femoral cement spacer. There was no significant loss of bone at this point. The  femoral canal was debrided using backbiting gouges and pituitary rongeurs. Swabs were submitted from the  femoral canal for stat Gram stain and culture. The polyethylene insert had been removed and attention was then directed at removal of the antibiotic impregnated spacer on the tibial aspect. The interface between the cement and bone was developed using curettes and rongeurs. The cement spacer was then tapped free of the bone with little loss of bone appreciated. Swabs were then obtained of the intramedullary canal of the tibia and cemented for stat Gram stain and culture. The knee was then debrided using a Versajet. At this point Gram stain results were returned which showed no organisms and 0-5 WBCs per high-powered field in all the specimens. The femoral canal was reamed up to a 14 mm diameter. A 31 mm broach was inserted and the previous bony cuts were assessed and found to be acceptable. Trial femoral component was created using 4 mm distal augments to both medial and lateral aspect and a 4 mm posterior augment laterally. Good fit of the trial components was appreciated. Attention was then directed to the tibia. The proximal 2 wires from the previous tibial tubercle osteotomy were removed. The tibial canal was then reamed up to a 12 mm diameter. The proximal tibial cut was performed with good surface appreciated. Conical reamers were then used to prepare the metaphyseal region for a tibial sleeve. The 29 mm trial sleeve was attached to a size 3 MBT revision trial with a 12 mm stem. Excellent fit was achieved. A trial reduction was then performed with first a 10 and subsequently a 15 mm TC 3 polyethylene trial. This allowed for good stability and flexion greater than 90. The patella was then inspected and measured using calipers. The patella measured approximately 10 mm in thickness and it was elected not to attempt resurfacing of the patella. The circumferential ridge of the patella was debrided so as to allow for better patellar tracking. Tourniquet was deflated initially after total tourniquet time of 120  minutes. Hemostasis was achieved using electrocautery. The knee was then irrigated with copious amounts of saline with antibiotic solution using pulsatile lavage. A solution of 17.5 mL of Betadine and 500 mL of normal saline was then used to soak the knee for approximately 5 minutes. The Betadine solution was then suctioned dry. Tourniquet was subsequently reinflated to 300 mmHg in anticipation of cementing the components. The bony margins were irrigated and suctioned dry. Polymethylmethacrylate cement with gentamicin was prepared in the usual fashion in a vacuum mixer. Cement was applied to the proximal tibia. A size 3 MBT revision tray with a 29 mm metaphyseal sleeve and a 75 mm x 12 mm universal fluted stem was positioned and impacted into place. Excess cement was removed using [employer] fast streamer. Cement was then applied to the cut surface of the femur. The femoral construct consisting of a size 3 TC 3 femoral component with a 5 femoral adapter, a +2 femoral adapter bolt, 4 mm medial and lateral distal augments, 4 mm lateral posterior augment, a 31 mm femoral sleeve, and a 75 mm x 14 mm universal fluted stem was then positioned and impacted into place. Excess cement was removed using [employer] fast streamer. A 15 mm polyethylene trial was inserted and the was brought in full extension with steady axial compression. After adequate curing of the cement, the tourniquet was deflated after second tourniquet time of 42 minutes. Good hemostasis was appreciated.  20 mL of 1.3% Exparel in 40 mL of normal  saline was injected along the posterior capsule, medial and lateral gutters, and along the arthrotomy site. A 15 mm TC3 rotating platform polyethylene insert was inserted and the knee was placed through a range of motion with excellent mediolateral soft tissue balancing appreciated and good patellar tracking noted. 2 medium drains were placed in the wound bed and brought out through separate stab incisions to be attached to a  Hemovac reservoir. The medial parapatellar incision was reapproximated using interrupted sutures of #1 Vicryl. Subcutaneous tissue was then injected with a total of 30 cc of 0.25% Marcaine with epinephrine. Subcutaneous tissue was approximated in layers using first #0 Vicryl followed #2-0 Vicryl. The skin was approximated with skin staples. A sterile dressing was applied.  The patient tolerated the procedure well and was transported to the recovery room in stable condition.    James P. Holley Bouche., M.D.

## 2015-05-17 NOTE — Anesthesia Procedure Notes (Signed)
Spinal Patient location during procedure: OR Start time: 05/17/2015 7:27 AM End time: 05/17/2015 7:31 AM Staffing Anesthesiologist: Marline Backbone F Resident/CRNA: Rolla Plate Performed by: resident/CRNA  Preanesthetic Checklist Completed: patient identified, site marked, surgical consent, pre-op evaluation, timeout performed, IV checked, risks and benefits discussed and monitors and equipment checked Spinal Block Patient position: sitting Prep: Betadine and site prepped and draped Patient monitoring: heart rate, continuous pulse ox, blood pressure and cardiac monitor Approach: midline Location: L4-5 Injection technique: single-shot Needle Needle type: Whitacre and Introducer  Needle gauge: 25 G Needle length: 9 cm Additional Notes Negative paresthesia. Negative blood return. Positive free-flowing CSF. Expiration date of kit checked and confirmed. Patient tolerated procedure well, without complications.

## 2015-05-17 NOTE — Brief Op Note (Signed)
05/17/2015  12:50 PM  PATIENT:  Chase Gibson  59 y.o. male  PRE-OPERATIVE DIAGNOSIS:  S/p removal of right total knee arthroplasty implants & placement of antibiotic spacer for periprosthetic infection  POST-OPERATIVE DIAGNOSIS:  same  PROCEDURE:  Procedure(s): TOTAL KNEE ARTHROPLASTY, REMOVAL OF SPACER (Right)  SURGEON:  Surgeon(s) and Role:    * Dereck Leep, MD - Primary  ASSISTANTS: Vance Peper, PA   ANESTHESIA:   spinal  EBL:  Total I/O In: 2100 [I.V.:2100] Out: 850 [Urine:550; Blood:300]  BLOOD ADMINISTERED:none  DRAINS: 2 medium hemovac   LOCAL MEDICATIONS USED:  MARCAINE    and OTHER Exparel  SPECIMEN:  Source of Specimen:  swabs from right knee  DISPOSITION OF SPECIMEN:  PATHOLOGY  COUNTS:  YES  TOURNIQUET:  1 - 120 min   2 - 42 min  DICTATION: .Dragon Dictation  PLAN OF CARE: Admit to inpatient   PATIENT DISPOSITION:  PACU - hemodynamically stable.   Delay start of Pharmacological VTE agent (>24hrs) due to surgical blood loss or risk of bleeding: yes

## 2015-05-17 NOTE — Anesthesia Postprocedure Evaluation (Signed)
  Anesthesia Post-op Note  Patient: Chase Gibson  Procedure(s) Performed: Procedure(s): TOTAL KNEE ARTHROPLASTY, REMOVAL OF SPACER (Right)  Anesthesia type:Spinal  Patient location: PACU  Post pain: Pain level controlled  Post assessment: Post-op Vital signs reviewed, Patient's Cardiovascular Status Stable, Respiratory Function Stable, Patent Airway and No signs of Nausea or vomiting  Post vital signs: Reviewed and stable  Last Vitals:  Filed Vitals:   05/17/15 1432  BP: 100/52  Pulse:   Temp:   Resp:     Level of consciousness: awake, alert  and patient cooperative  Complications: No apparent anesthesia complications

## 2015-05-17 NOTE — Anesthesia Preprocedure Evaluation (Addendum)
Anesthesia Evaluation  Patient identified by MRN, date of birth, ID band Patient awake    Reviewed: Allergy & Precautions, NPO status , Patient's Chart, lab work & pertinent test results  Airway Mallampati: III       Dental  (+) Edentulous Upper, Edentulous Lower   Pulmonary shortness of breath, COPDCurrent Smoker,  + rhonchi   + decreased breath sounds      Cardiovascular + CAD and + Past MI Rate:Normal     Neuro/Psych Anxiety Depression    GI/Hepatic Neg liver ROS, GERD-  ,  Endo/Other  diabetes, Type 2, Oral Hypoglycemic Agents  Renal/GU   negative genitourinary   Musculoskeletal   Abdominal   Peds negative pediatric ROS (+)  Hematology  (+) anemia ,   Anesthesia Other Findings   Reproductive/Obstetrics                          Anesthesia Physical Anesthesia Plan  ASA: III  Anesthesia Plan: Spinal   Post-op Pain Management:    Induction:   Airway Management Planned: Nasal Cannula  Additional Equipment:   Intra-op Plan:   Post-operative Plan:   Informed Consent: I have reviewed the patients History and Physical, chart, labs and discussed the procedure including the risks, benefits and alternatives for the proposed anesthesia with the patient or authorized representative who has indicated his/her understanding and acceptance.     Plan Discussed with: CRNA  Anesthesia Plan Comments:         Anesthesia Quick Evaluation

## 2015-05-18 LAB — BASIC METABOLIC PANEL
Anion gap: 5 (ref 5–15)
BUN: 19 mg/dL (ref 6–20)
CALCIUM: 7.9 mg/dL — AB (ref 8.9–10.3)
CO2: 22 mmol/L (ref 22–32)
Chloride: 111 mmol/L (ref 101–111)
Creatinine, Ser: 2.26 mg/dL — ABNORMAL HIGH (ref 0.61–1.24)
GFR calc Af Amer: 35 mL/min — ABNORMAL LOW (ref >60)
GFR, EST NON AFRICAN AMERICAN: 30 mL/min — AB (ref >60)
GLUCOSE: 105 mg/dL — AB (ref 65–99)
POTASSIUM: 4.6 mmol/L (ref 3.5–5.1)
Sodium: 138 mmol/L (ref 135–145)

## 2015-05-18 LAB — CBC
HEMATOCRIT: 23.8 % — AB (ref 40.0–52.0)
Hemoglobin: 8 g/dL — ABNORMAL LOW (ref 13.0–18.0)
MCH: 31.3 pg (ref 26.0–34.0)
MCHC: 33.6 g/dL (ref 32.0–36.0)
MCV: 93.2 fL (ref 80.0–100.0)
Platelets: 109 10*3/uL — ABNORMAL LOW (ref 150–440)
RBC: 2.56 MIL/uL — ABNORMAL LOW (ref 4.40–5.90)
RDW: 14.3 % (ref 11.5–14.5)
WBC: 6.1 10*3/uL (ref 3.8–10.6)

## 2015-05-18 NOTE — Progress Notes (Signed)
Clinical Social Worker (CSW) received SNF consult. PT is recommending home health. RN Case Manager aware of above. Please reconsult if future social work needs arise. CSW signing off.   Blake Vetrano Morgan, LCSWA [phone] 

## 2015-05-18 NOTE — Evaluation (Signed)
Physical Therapy Evaluation Patient Details Name: Chase Gibson MRN: [id number] DOB: 1956-07-20 Today's Date: 05/18/2015   History of Present Illness  Pt is a 59yo white male c extensive history of surgeries on R knee. Pt has been NWB in a fixed extension brace on R knee since December 2015, when hardware was removed following an infection.   Clinical Impression  Pt is received semirecumbent in bed upon entry, awake, alert, and excited to participate. No acute distress noted. Pt is A&Ox3 and pleasant. Pt reports zero falls in the last 6 months. Strength assessed via functional mobility presents with mild weakness, requiring self assist to perform bed mobility, but demonstrating good strength for performing bed exercises. Pt falls risk is high as evidenced by slow gait speed, poor forward reach, and altered posturing during transfers and mobility. Pt remaining on room air during eval with Sats at 98% and HR 72 bpm. Patient presenting with impairment of strength, range of motion, balance, and activity tolerance, limiting ability to perform ADL and mobility tasks at  baseline level of function. Patient will benefit from skilled intervention to address the above impairments and limitations, in order to restore to prior level of function, improve patient safety upon discharge, and to decrease falls risk.       Follow Up Recommendations      Equipment Recommendations       Recommendations for Other Services       Precautions / Restrictions Precautions Required Braces or Orthoses: Knee Immobilizer - Right Knee Immobilizer - Right: Discontinue once straight leg raise with < 10 degree lag Restrictions Weight Bearing Restrictions: Yes RLE Weight Bearing: Weight bearing as tolerated      Mobility  Bed Mobility Overal bed mobility: Modified Independent             General bed mobility comments: Self assist with RLE, able to perform c additional time.  No cues required.    Transfers Overall transfer level: Needs assistance Equipment used: Rolling walker (2 wheeled) Transfers: Sit to/from Stand Sit to Stand: Supervision         General transfer comment: RW is very far away, but patient has been doing this way after multiple surgeries. Will review safe practices.   Ambulation/Gait Ambulation/Gait assistance: Min guard Ambulation Distance (Feet): 100 Feet Assistive device: Rolling walker (2 wheeled) Gait Pattern/deviations: Decreased step length - left   Gait velocity interpretation: <1.8 ft/sec, indicative of risk for recurrent falls General Gait Details: Given cues to equalize step length.   Stairs            Wheelchair Mobility    Modified Rankin (Stroke Patients Only)       Balance Overall balance assessment: No apparent balance deficits (not formally assessed)                                           Pertinent Vitals/Pain Pain Assessment: No/denies pain (Denies pain at rest, reporting he just had pain meds. Pt demonstrates some facial expression of pain c ranging the knee but says it feels good, saying its a 'good kind of pain, ya know?' )    Home Living Family/patient expects to be discharged to:: Private residence Living Arrangements: Alone Available Help at Discharge: Friend(s);Family Type of Home: House Home Access: Ramped entrance;Level entry     Home Layout: One level Home Equipment: [place] - 2 wheels;Cane - single  point;Crutches      Prior Function Level of Independence: Independent with assistive device(s)         Comments: Pt has been NWB on RLE since December 2015, able to perform limited community distances c [facility].     Hand Dominance        Extremity/Trunk Assessment   Upper Extremity Assessment: Overall WFL for tasks assessed           Lower Extremity Assessment: Overall WFL for tasks assessed      Cervical / Trunk Assessment: Normal  Communication   Communication: No  difficulties  Cognition Arousal/Alertness: Awake/alert Behavior During Therapy: WFL for tasks assessed/performed Overall Cognitive Status: Within Functional Limits for tasks assessed                      General Comments      Exercises Total Joint Exercises Ankle Circles/Pumps: Right (Has been performing already. ) Quad Sets: Strengthening;AAROM;Right;10 reps;Supine Heel Slides: AAROM;Strengthening;Right;10 reps;Supine Hip ABduction/ADduction: AAROM;Strengthening;Right;10 reps;Supine Straight Leg Raises: AAROM;Strengthening;Right;10 reps;Supine Goniometric ROM: 10-40 degrees.       Assessment/Plan    PT Assessment Patient needs continued PT services  PT Diagnosis Difficulty walking;Abnormality of gait;Acute pain;Generalized weakness   PT Problem List Decreased strength;Decreased range of motion;Decreased mobility;Decreased safety awareness;Pain  PT Treatment Interventions     PT Goals (Current goals can be found in the Care Plan section) Acute Rehab PT Goals Patient Stated Goal: Pt is ready to DC and start walking [employer] for exercise.  PT Goal Formulation: With patient Time For Goal Achievement: 06/01/15 Potential to Achieve Goals: Good    Frequency     Barriers to discharge        Co-evaluation               End of Session Equipment Utilized During Treatment: Gait belt Activity Tolerance: Patient tolerated treatment well Patient left: in chair;with chair alarm set;with call bell/phone within reach Nurse Communication: Mobility status;Other (comment)         Time: 7341-9379 PT Time Calculation (min) (ACUTE ONLY): 31 min   Charges:   PT Evaluation $Initial PT Evaluation Tier I: 1 Procedure PT Treatments $Therapeutic Exercise: 8-22 mins   PT G Codes:        Anwitha Mapes C 2015-06-08, 10:52 AM  10:55 AM  Etta Grandchild, PT, DPT [place] License # [id number]

## 2015-05-18 NOTE — Progress Notes (Signed)
OT Cancellation Note  Patient Details Name: ALIJAH AKRAM MRN: [id number] DOB: 1955-11-01   Cancelled Treatment:    Reason Eval/Treat Not Completed:  (Patient decline OT. He most likely does not need OT. Will check tomorrow)  Myrene Galas, MS/OTR/L  05/18/2015, 1:50 PM

## 2015-05-18 NOTE — Care Management Note (Addendum)
Case Management Note  Patient Details  Name: Chase Gibson MRN: [id number] Date of Birth: [birth year]  Subjective/Objective:                 Met with patient to discuss discharge planning. He is requesting to go outside to smoke. He states he plans to return home with PT named [name] that is with [facility]. He states "he will not have a PICC line again" which was why he used [facility] in the past. He states "[facility] nurses didn't have it together but I like their PT". He has a rolling walker. He uses [facility] [phone] for Rx. He states he has several family members and friends to assist him in the home and he plans to return home at discharge.   Action/Plan: Referral called to [facility] with request for [facility]. Lovenox 49m #14 called in to [facility] RNCM will continue to follow.   Expected Discharge Date:                  Expected [place]charge Plan:  [facility] [place] Referral:  NA  Discharge planning Services  CM Consult  Post Acute Care Choice:  Home Health Choice offered to:  Patient  DME Arranged:  N/A DME Agency:     HH Arranged:  PT [place] Agency[place] Status of Service:  In process, will continue to follow  Medicare Important Message Given:    Date Medicare IM Given:    Medicare IM give by:    Date Additional Medicare IM Given:    Additional Medicare Important Message give by:     If discussed at [place] Meetings, dates discussed:    Additional Comments: Lovenox $0.00.   AMarshell Garfinkel RN 05/18/2015, 9:38 AM

## 2015-05-18 NOTE — Progress Notes (Signed)
Patient educated on safety precaution and why its important for him to call for assistance since he is POD# 1. Patient became upset that we were treating him like a baby and wanted to speak with the manager. I advised him that I was the Department Director and he pulled my card up from where I spoke with him earlier. I explained that I am the person on the card. He states he did not need assistance getting up and that he would speak with Dr. Marry Guan in the morning about discharging him. I advised him that, he could do that and if his pain was controled and since he did ambulate with therapy around the unit that he might be able to be discharged. (patient has not had a BM yet). He refused for [place] to put the bed alarm on as well.

## 2015-05-18 NOTE — Progress Notes (Signed)
Physical Therapy Treatment Patient Details Name: Chase Gibson MRN: [id number] DOB: April 05, 1956 Today's Date: 05/18/2015    History of Present Illness Pt is a 59yo white male c extensive history of surgeries on R knee. Pt has been NWB in a fixed extension brace on R knee since December 2015, when hardware was removed following an infection.     PT Comments    Pt tolerating treatment session well, motivated and able to complete entire PT sesssion as planned. Pt continues to make progress toward goals as evidenced by improved activity tolerance. Pt's greatest limitation continues to be painful/limited knee flexion which continues to limit ability to perform HEP/functional mobility at baseline function. Patient presenting with impairment of strength, pain, range of motion, and activity tolerance, limiting ability to perform ADL and mobility tasks at  baseline level of function. Patient will benefit from skilled intervention to address the above impairments and limitations, in order to restore to prior level of function, improve patient safety upon discharge, and to decrease caregiver burden.    Follow Up Recommendations  Home health PT     Equipment Recommendations  None recommended by PT    Recommendations for Other Services       Precautions / Restrictions Precautions Required Braces or Orthoses: Knee Immobilizer - Right Knee Immobilizer - Right: Discontinue once straight leg raise with < 10 degree lag Restrictions Weight Bearing Restrictions: Yes RLE Weight Bearing: Weight bearing as tolerated    Mobility  Bed Mobility Overal bed mobility: Modified Independent             General bed mobility comments: Self assist with RLE, able to perform c additional time.  No cues required.   Transfers Overall transfer level: Needs assistance Equipment used: Rolling walker (2 wheeled) Transfers: Sit to/from Stand Sit to Stand: Supervision         General transfer comment: Safe  use of RW s VC.   Ambulation/Gait Ambulation/Gait assistance: Min guard Ambulation Distance (Feet): 250 Feet Assistive device: Rolling walker (2 wheeled) Gait Pattern/deviations: Decreased step length - left Gait velocity: 0.27 m/s  Gait velocity interpretation: <1.8 ft/sec, indicative of risk for recurrent falls General Gait Details: Pt insisted on ambulating fully around unit despite attemps to persuade otherwise. Pt given cues for equaliation of step length, able to perform for brief periods but easily distracted and returns to ataxic gait.    Stairs            Wheelchair Mobility    Modified Rankin (Stroke Patients Only)       Balance Overall balance assessment: No apparent balance deficits (not formally assessed)                                  Cognition Arousal/Alertness: Awake/alert Behavior During Therapy: WFL for tasks assessed/performed Overall Cognitive Status: Within Functional Limits for tasks assessed                      Exercises Total Joint Exercises Ankle Circles/Pumps: Right (Has been performing already. ) Quad Sets: Strengthening;AAROM;Right;Supine;15 reps Heel Slides: AAROM;Strengthening;Right;Supine;15 reps (self assist c gait belt ) Hip ABduction/ADduction: AAROM;Strengthening;Right;10 reps;Supine Straight Leg Raises: AAROM;Strengthening;Right;Supine;15 reps Long Arc Quad: AAROM;Strengthening;Right;15 reps;Supine Goniometric ROM: 8-45 degrees.    General Comments        Pertinent Vitals/Pain Pain Assessment: 0-10 Pain Score: 9  Pain Descriptors / Indicators: Aching Pain Intervention(s): Limited activity within patient's tolerance;Monitored  during session;Premedicated before session    [place] expects to be discharged to:: Private residence Living Arrangements: Alone Available Help at Discharge: Friend(s);Family Type of Home: House Home Access: Ramped entrance;Level entry   Home Layout: One  level Home Equipment: [place] - 2 wheels;Cane - single point;Crutches      Prior Function Level of Independence: Independent with assistive device(s)      Comments: Pt has been NWB on RLE since December 2015, able to perform limited community distances c [facility].   PT Goals (current goals can now be found in the care plan section) Acute Rehab PT Goals Patient Stated Goal: Pt is ready to DC and start walking [employer] for exercise.  PT Goal Formulation: With patient Time For Goal Achievement: 06/01/15 Potential to Achieve Goals: Good    Frequency  BID    PT Plan Current plan remains appropriate    Co-evaluation             End of Session Equipment Utilized During Treatment: Gait belt Activity Tolerance: Patient limited by pain Patient left: in bed;with bed alarm set;with family/visitor present;with call bell/phone within reach     Time: 6546-5035 PT Time Calculation (min) (ACUTE ONLY): 27 min  Charges:  $Gait Training: 8-22 mins $Therapeutic Exercise: 8-22 mins                    G Codes:      Buccola,Allan C 2015-06-09, 1:51 PM  1:52 PM  Etta Grandchild, PT, DPT [place] License # [id number]

## 2015-05-18 NOTE — Plan of Care (Signed)
Problem: Acute Rehab PT Goals(only PT should resolve) Goal: Pt Will Go Supine/Side To Sit Pt will demonstrate ModI bed mobility supine to sitting edge-of-bed to return to PLOF and to decrease caregiver burden.     Goal: Patient Will Transfer Sit To/From Stand Pt will transfer sit to/from-stand with RW at ModI without loss-of-balance to demonstrate good safety awareness for independent mobility in home.     Goal: Pt Will Ambulate Pt will ambulate with RW at Supervision using a step-through pattern and equal step length for a distances greater than 258ft to demonstrate the ability to perform safe household distance ambulation at discharge.

## 2015-05-18 NOTE — Progress Notes (Signed)
Subjective: 1 Day Post-Op Procedure(s) (LRB): TOTAL KNEE ARTHROPLASTY, REMOVAL OF SPACER (Right) Patient reports pain as 3 on 0-10 scale and 6 on 0-10 scale.   Patient is well, and has had no acute complaints or problems We will start therapy today.  Plan is to go Home after hospital stay. no nausea and no vomiting Patient denies any chest pains or shortness of breath. Objective: Vital signs in last 24 hours: Temp:  [96.2 F (35.7 C)-100 F (37.8 C)] 97.9 F (36.6 C) (07/26 0358) Pulse Rate:  [57-101] 71 (07/26 0358) Resp:  [16-20] 18 (07/26 0358) BP: (64-107)/(31-71) 90/52 mmHg (07/26 0400) SpO2:  [94 %-100 %] 94 % (07/26 0358) FiO2 (%):  [21 %] 21 % (07/25 1355) Weight:  [83.462 kg (184 lb)] 83.462 kg (184 lb) (07/25 1820) Still has original dressing in place Heels are non tender and elevated off the bed using rolled towels Intake/Output from previous day: 07/25 0701 - 07/26 0700 In: 3985 [P.O.:240; I.V.:2595; IV Piggyback:150] Out: 2465 [Urine:1825; Drains:340; Blood:300] Intake/Output this shift:     Recent Labs  05/18/15 0418  HGB 8.0*    Recent Labs  05/18/15 0418  WBC 6.1  RBC 2.56*  HCT 23.8*  PLT 109*    Recent Labs  05/17/15 0714 05/18/15 0418  NA  --  138  K 4.3 4.6  CL  --  111  CO2  --  22  BUN  --  19  CREATININE  --  2.26*  GLUCOSE  --  105*  CALCIUM  --  7.9*   No results for input(s): LABPT, INR in the last 72 hours.  EXAM General - Patient is Alert, Appropriate, Oriented and Confused Extremity - Neurologically intact Neurovascular intact Sensation intact distally Intact pulses distally Dorsiflexion/Plantar flexion intact Dressing - dressing C/D/I Motor Function - intact, moving foot and toes well on exam. Able to do SLR on own.  Past Medical History  Diagnosis Date  . ALLERGIC RHINITIS 03/18/2009  . ASTHMA 06/24/2007  . BREAST PAIN, LEFT 07/21/2008  . DEPRESSION 06/24/2007  . DIABETES MELLITUS, TYPE II 07/21/2008  .  HYPERLIPIDEMIA 07/21/2008  . INSOMNIA-SLEEP DISORDER-UNSPEC 03/28/2010  . LOW BACK PAIN 09/03/2007  . NAUSEA 06/14/2010  . OSTEOARTHRITIS, KNEES, BILATERAL 08/25/2008  . PERIPHERAL EDEMA 07/21/2008  . SYMPTOM, ABNORMAL INVOLUNTARY MOVEMENT NEC 06/21/2007  . Wheezing 07/13/2009  . COPD (chronic obstructive pulmonary disease) 03/28/2012  . Colon polyps 03/28/2012  . GERD 08/25/2008    pt denies at PAT visit on 02/25/15  . ANXIETY 06/24/2007    pt denies at PAT visit on 02/25/15  . Vertigo 11/2014    pt has a bout every 3-4 months, no pattern  . CKD (chronic kidney disease), stage III   . NSTEMI, initial episode of care 03/11/2015    Mild Troponin ~0.45, transient Anterior ST-T changes  . CAD S/P percutaneous coronary angioplasty 03/11/2015    SEVERE SINGLE VESSEL CAD OF THE PROX LAD - 95%--> 0% post PCI Vision BMS 3.5 x 18 (3.75 mm); MILD-MODERATE (~50%) NONOBSTRUCTIVE LCX AND RCA STENOSES   . Tobacco abuse     Assessment/Plan: 1 Day Post-Op Procedure(s) (LRB): TOTAL KNEE ARTHROPLASTY, REMOVAL OF SPACER (Right) Active Problems:   Total knee replacement status  Estimated body mass index is 26.4 kg/(m^2) as calculated from the following:   Height as of this encounter: 5\' 10"  (1.778 m).   Weight as of this encounter: 83.462 kg (184 lb). Advance diet Up with therapy D/C IV fluids Discharge  home with home health on thurs.  Labs: reviewed DVT Prophylaxis - Lovenox, Foot Pumps and TED hose Weight-Bearing as tolerated to left leg Need to start working on a bowel movment D/C O2 and Pulse OX and try on Room [employer] R. [place] 05/18/2015, 7:25 AM

## 2015-05-19 LAB — CBC
HCT: 23.4 % — ABNORMAL LOW (ref 40.0–52.0)
Hemoglobin: 7.9 g/dL — ABNORMAL LOW (ref 13.0–18.0)
MCH: 31.1 pg (ref 26.0–34.0)
MCHC: 33.8 g/dL (ref 32.0–36.0)
MCV: 91.9 fL (ref 80.0–100.0)
PLATELETS: 101 10*3/uL — AB (ref 150–440)
RBC: 2.55 MIL/uL — AB (ref 4.40–5.90)
RDW: 14.4 % (ref 11.5–14.5)
WBC: 4.7 10*3/uL (ref 3.8–10.6)

## 2015-05-19 LAB — CREATININE, SERUM
CREATININE: 2.05 mg/dL — AB (ref 0.61–1.24)
GFR calc Af Amer: 40 mL/min — ABNORMAL LOW (ref >60)
GFR, EST NON AFRICAN AMERICAN: 34 mL/min — AB (ref >60)

## 2015-05-19 MED ORDER — ENOXAPARIN SODIUM 40 MG/0.4ML ~~LOC~~ SOLN: 30.0000 mg | SUBCUTANEOUS | Status: DC

## 2015-05-19 MED ORDER — LACTULOSE 10 GM/15ML PO SOLN: 10.0000 g | Freq: Two times a day (BID) | ORAL | Status: DC | PRN

## 2015-05-19 MED ORDER — OXYCODONE HCL 5 MG PO TABS: 5.0000 mg | ORAL_TABLET | ORAL | Status: DC | PRN

## 2015-05-19 NOTE — Progress Notes (Signed)
Physical Therapy Treatment Patient Details Name: Chase Gibson MRN: [id number] DOB: 13-Jan-1956 Today's Date: 05/19/2015    History of Present Illness Pt is a 59yo white male c extensive history of surgeries on R knee. Pt has been NWB in a fixed extension brace on R knee since December 2015, when hardware was removed following an infection.     PT Comments    Pt tolerating treatment session well, motivated and able to complete entire PT sesssion as planned. Pt continues to make progress toward goals as evidenced by improved indep c HEP. Pt's greatest limitation continues to be painful/limited knee ROMwhich continues to limit ability to perform most mobility at baseline function. Patient presenting with impairment of strength, pain, range of motion, balance, and activity tolerance, limiting ability to perform ADL and mobility tasks at  baseline level of function. Patient will benefit from skilled intervention to address the above impairments and limitations, in order to restore to prior level of function, improve patient safety upon discharge, and to decrease caregiver burden.    Follow Up Recommendations  Home health PT     Equipment Recommendations  None recommended by PT    Recommendations for Other Services       Precautions / Restrictions Precautions Required Braces or Orthoses: Knee Immobilizer - Right Knee Immobilizer - Right: Discontinue once straight leg raise with < 10 degree lag Restrictions Weight Bearing Restrictions: Yes RLE Weight Bearing: Weight bearing as tolerated    Mobility  Bed Mobility               General bed mobility comments: Received in chair.   Transfers Overall transfer level: Needs assistance Equipment used: Rolling walker (2 wheeled) Transfers: Sit to/from Stand Sit to Stand: Supervision         General transfer comment: Much safer use from chair.   Ambulation/Gait Ambulation/Gait assistance: Supervision Ambulation Distance  (Feet): 250 Feet Assistive device: Rolling walker (2 wheeled)     Gait velocity interpretation: <1.8 ft/sec, indicative of risk for recurrent falls General Gait Details: Multiple cues, similar to earlier, given for step correction. Pt still seems to have mild difficulty understanding what is being explained, and only able to maintain corrections for short periods prior to reverting back to antalgic gait.    Stairs            Wheelchair Mobility    Modified Rankin (Stroke Patients Only)       Balance Overall balance assessment: No apparent balance deficits (not formally assessed)                                  Cognition Arousal/Alertness: Awake/alert Behavior During Therapy: Agitated;Anxious (Pt is very upset c event of today, mentions repeated chair alarm going off; pt asking about ability to DC early AMA. ) Overall Cognitive Status: Within Functional Limits for tasks assessed                      Exercises Total Joint Exercises Short Arc Quad: AAROM;Strengthening;Right;Other reps (comment) (2x25 10-25 degrees; very weak, limited activation. )    General Comments        Pertinent Vitals/Pain Pain Assessment: 0-10 Pain Score: 10-Worst pain ever Pain Location: Lateral Knee near drain site; also c/o N/T on ball of foot, and c toe flexion weakness.  Pain Descriptors / Indicators: Aching;Burning Pain Intervention(s): Limited activity within patient's tolerance;Monitored during session;Patient requesting pain meds-RN  notified;Ice applied    Home Living                      Prior Function            PT Goals (current goals can now be found in the care plan section) Acute Rehab PT Goals Patient Stated Goal: Pt wants to DC home immediately and is aggravated in general.  PT Goal Formulation: With patient Time For Goal Achievement: 06/01/15 Potential to Achieve Goals: Good Progress towards PT goals: Progressing toward goals     Frequency  BID    PT Plan Current plan remains appropriate    Co-evaluation             End of Session Equipment Utilized During Treatment: Gait belt Activity Tolerance: Patient limited by pain Patient left: in chair;with chair alarm set;with call bell/phone within reach     Time: 1430-1505 PT Time Calculation (min) (ACUTE ONLY): 35 min  Charges:  $Gait Training: 8-22 mins $Therapeutic Exercise: 8-22 mins                    G Codes:      Mycal Conde C 06/14/2015, 3:20 PM 3:23 PM  Etta Grandchild, PT, DPT [place] License # [id number]      .

## 2015-05-19 NOTE — Discharge Instructions (Signed)

## 2015-05-19 NOTE — Progress Notes (Signed)
Physical Therapy Treatment Patient Details Name: Chase Gibson MRN: [id number] DOB: [birth year] Today's Date: 05/19/2015    History of Present Illness Pt is a 59yo white male c extensive history of surgeries on R knee. Pt has been NWB in a fixed extension brace on R knee since December 2015, when hardware was removed following an infection.     PT Comments    Pt tolerating treatment session reasonably well, motivated and able to complete entire PT sesssion as planned, but therex is mildly limited to pain and weakness. Pt continues to make progress toward goals as evidenced by improved indep in HEP. Pt's greatest limitation continues to be painful/limited knee flexion which continues to limit ability to perform HEP independently as prescribed. Pt presenting with impairment of strength, pain, range of motion, balance, and activity tolerance, limiting ability to perform ADL and mobility tasks at  baseline level of function. Patient will benefit from skilled intervention to address the above impairments and limitations, in order to restore to prior level of function, improve patient safety upon discharge, and to decrease caregiver burden.    Follow Up Recommendations  Home health PT     Equipment Recommendations  None recommended by PT    Recommendations for Other Services       Precautions / Restrictions Precautions Required Braces or Orthoses: Knee Immobilizer - Right Knee Immobilizer - Right: Discontinue once straight leg raise with < 10 degree lag Restrictions Weight Bearing Restrictions: Yes RLE Weight Bearing: Weight bearing as tolerated    Mobility  Bed Mobility Overal bed mobility: Modified Independent             General bed mobility comments: Shown leg hook for self assist.   Transfers Overall transfer level: Needs assistance Equipment used: Rolling walker (2 wheeled) Transfers: Sit to/from Stand Sit to Stand: Supervision         General transfer comment:  Demonstrating unsafe practices c RW, i.e., standing s RW and trying to reach for it 4 feet away. Pt given lots of cues for safety, does not always acknowledge or follow.   Ambulation/Gait Ambulation/Gait assistance: Min guard Ambulation Distance (Feet): 250 Feet Assistive device: Rolling walker (2 wheeled)   Gait velocity: 0.3m/s Gait velocity interpretation: <1.8 ft/sec, indicative of risk for recurrent falls General Gait Details: Hb/HCT slightly lower today, pt denies S/Sx associated c anemia; given VCx6 for correction of step, pt inconsistently following, and easily distracted to old antalgic gait pattern.    Stairs            Wheelchair Mobility    Modified Rankin (Stroke Patients Only)       Balance Overall balance assessment: No apparent balance deficits (not formally assessed)                                  Cognition Arousal/Alertness: Awake/alert Behavior During Therapy: WFL for tasks assessed/performed Overall Cognitive Status: Within Functional Limits for tasks assessed                      Exercises Total Joint Exercises Ankle Circles/Pumps: Right (Has been performing indep. ) Quad Sets: Strengthening;AAROM;Right;Supine;15 reps (Limited quads activation, mostly becomes a glute set. ) Heel Slides: AAROM;Strengthening;Right;Supine;15 reps (Has been performing indep. 1x last night. ) Hip ABduction/ADduction: AAROM;Strengthening;Right;Supine (Has been performing indep. ) Straight Leg Raises: AAROM;Strengthening;Right;Supine;15 reps (encouraged to avoid longsitting while performing; pt requires follow up on this. )  Goniometric ROM: 8-65 degrees.     General Comments        Pertinent Vitals/Pain Pain Assessment: 0-10 Pain Score: 9  Pain Descriptors / Indicators: Aching Pain Intervention(s): Monitored during session;Limited activity within patient's tolerance;Ice applied;Premedicated before session    Home Living                       Prior Function            PT Goals (current goals can now be found in the care plan section) Acute Rehab PT Goals Patient Stated Goal: Pt is ready to DC and start walking [employer] for exercise.  PT Goal Formulation: With patient Time For Goal Achievement: 06/01/15 Potential to Achieve Goals: Good Progress towards PT goals: Progressing toward goals    Frequency  BID    PT Plan Current plan remains appropriate    Co-evaluation             End of Session Equipment Utilized During Treatment: Gait belt Activity Tolerance: Patient limited by pain Patient left: in chair;with chair alarm set;with call bell/phone within reach     Time: 0951-1019 PT Time Calculation (min) (ACUTE ONLY): 28 min  Charges:  $Gait Training: 8-22 mins $Therapeutic Exercise: 8-22 mins                    G Codes:      Buccola,Allan C 05/22/2015, 10:26 AM 10:28 AM  Etta Grandchild, PT, DPT [place] License # [id number]

## 2015-05-19 NOTE — Discharge Summary (Signed)
Physician Discharge Summary  Patient ID: MILBURN Gibson MRN: [id number] DOB/AGE: August 05, 1956 59 y.o.  Admit date: 05/17/2015 Discharge date: 05/19/2015  Admission Diagnoses:  OA   Discharge Diagnoses: Patient Active Problem List   Diagnosis Date Noted  . Total knee replacement status 05/17/2015  . NSTEMI, initial episode of care 03/11/2015  . CAD S/P LAD BMS 03/10/15 03/11/2015    Class: Diagnosis of  . Acute coronary syndrome 03/10/2015  . Chest pain 03/10/2015  . DJD (degenerative joint disease) of knee 03/10/2015  . Acute renal failure superimposed on stage 3 chronic kidney disease 03/10/2015  . Normochromic normocytic anemia   . Tobacco abuse   . Acute on chronic renal insufficiency   . Acute bronchitis 04/12/2012  . Back pain, acute 04/12/2012  . COPD/ pfts pending 03/28/2012  . Colon polyps 03/28/2012  . BRBPR (bright red blood per rectum) 03/28/2012  . COPD exacerbation 03/28/2012  . Preventative health care 03/26/2011  . INSOMNIA-SLEEP DISORDER-UNSPEC 03/28/2010  . ALLERGIC RHINITIS 03/18/2009  . GERD 08/25/2008  . OSTEOARTHRITIS, KNEES, BILATERAL 08/25/2008  . Type II diabetes mellitus with complication 02/58/5277  . Hyperlipidemia with target LDL less than 70 07/21/2008  . PERIPHERAL EDEMA 07/21/2008  . LOW BACK PAIN 09/03/2007  . Anxiety state 06/24/2007  . Depression 06/24/2007  . ASTHMA 06/24/2007  . SYMPTOM, ABNORMAL INVOLUNTARY MOVEMENT NEC 06/21/2007    Past Medical History  Diagnosis Date  . ALLERGIC RHINITIS 03/18/2009  . ASTHMA 06/24/2007  . BREAST PAIN, LEFT 07/21/2008  . DEPRESSION 06/24/2007  . DIABETES MELLITUS, TYPE II 07/21/2008  . HYPERLIPIDEMIA 07/21/2008  . INSOMNIA-SLEEP DISORDER-UNSPEC 03/28/2010  . LOW BACK PAIN 09/03/2007  . NAUSEA 06/14/2010  . OSTEOARTHRITIS, KNEES, BILATERAL 08/25/2008  . PERIPHERAL EDEMA 07/21/2008  . SYMPTOM, ABNORMAL INVOLUNTARY MOVEMENT NEC 06/21/2007  . Wheezing 07/13/2009  . COPD (chronic obstructive pulmonary  disease) 03/28/2012  . Colon polyps 03/28/2012  . GERD 08/25/2008    pt denies at PAT visit on 02/25/15  . ANXIETY 06/24/2007    pt denies at PAT visit on 02/25/15  . Vertigo 11/2014    pt has a bout every 3-4 months, no pattern  . CKD (chronic kidney disease), stage III   . NSTEMI, initial episode of care 03/11/2015    Mild Troponin ~0.45, transient Anterior ST-T changes  . CAD S/P percutaneous coronary angioplasty 03/11/2015    SEVERE SINGLE VESSEL CAD OF THE PROX LAD - 95%--> 0% post PCI Vision BMS 3.5 x 18 (3.75 mm); MILD-MODERATE (~50%) NONOBSTRUCTIVE LCX AND RCA STENOSES   . Tobacco abuse      Transfusion: No transfusions given during this admission   Consultants (if any):   case management  Discharged Condition: Improved  Hospital Course: Chase Gibson is an 59 y.o. male who was admitted 05/17/2015 with a diagnosis of post total knee arthroplasty with MRSA infection and went to the operating room on 05/17/2015 and underwent the above named procedures.    Surgeries:Procedure(s): TOTAL KNEE ARTHROPLASTY, REMOVAL OF SPACER on 05/17/2015  ANESTHESIA: spinal  ESTIMATED BLOOD LOSS: 300 mL  FLUIDS REPLACED: 2100 mL of crystalloid  TOURNIQUET TIME: 1 - 120 minutes 2 - 42 minutes  DRAINS: 2 medium Hemovac drains   IMPLANTS UTILIZED: DePuy PFC Sigma TC3 size 3 femoral component (cemented), +2 PFC Sigma femoral adapter bolt, 4 mm medial and lateral distal augments, 4 mm 4 mm lateral posterior augment, 5 femoral adapter, 31 mm universal femoral sleeve, 75 mm x 14 mm universal fluted stem, size 3  MBT revision tibial component (cemented), 29 mm MBT revision metaphyseal sleeve, 75 mm x 12 mm universal fluted stem, and a 15 mm TC3 rotating platform polyethylene insert.  INDICATIONS FOR SURGERY: Chase STRANAHAN is a 59 y.o. year old male who underwent a right total knee arthroplasty several years ago in [place]. He developed an MRSA right knee periprosthetic infection and underwent removal of  the right total knee implants with debridement and placement of an antibiotic impregnated cement spacer. In February 2016 he returned to the operating room and underwent a second debridement and reinsertion of antibiotic impregnated cement spacer due to a positive intraoperative Gram stain. He has subsequently completed a course of IV antibiotics with normalization of his C-reactive protein.   The risks, benefits, and alternatives were discussed at length including but not limited to the risks of infection, bleeding, nerve injury, stiffness, blood clots, the need for revision surgery, cardiopulmonary complications, among others, and they were willing to proceed. Patient tolerated the surgery well. No complications .Patient was taken to PACU where she was stabilized and then transferred to the orthopedic floor.  Patient started on Lovenox 30 mg q 12 hrs. Foot pumps applied bilaterally at 80 mm hg. Heels elevated off bed with rolled towels. No evidence of DVT. Calves non tender. Negative Homan. Physical therapy started on day #1 for gait training and transfer with OT starting on  day #1 for ADL and assisted devices. Patient has done well with therapy. Ambulated 200 feet upon being discharged. Able to work 4 steps independently and safely. We'll get in and out of bed without any problems.  Patient's IV , foley day #1 and hemovac was d/c on day #2.   He was given perioperative antibiotics:  Anti-infectives    Start     Dose/Rate Route Frequency Ordered Stop   05/17/15 1400  ceFAZolin (ANCEF) IVPB 2 g/50 mL premix     2 g 100 mL/hr over 30 Minutes Intravenous Every 6 hours 05/17/15 1354 05/18/15 0903   05/17/15 0613  ceFAZolin (ANCEF) 2-3 GM-% IVPB SOLR    Comments:  JOYCE, HEATHER: cabinet override      05/17/15 0613 05/17/15 1814   05/17/15 0600  ceFAZolin (ANCEF) IVPB 2 g/50 mL premix     2 g 100 mL/hr over 30 Minutes Intravenous On call to O.R. 05/16/15 2322 05/17/15 0947    .  He was given  sequential compression devices, early ambulation, and Lovenox and TED stockings for DVT prophylaxis.  He benefited maximally from the hospital stay and there were no complications.    Recent vital signs:  Filed Vitals:   05/19/15 0853  BP: 108/54  Pulse: 79  Temp: 97.8 F (36.6 C)  Resp: 18    Recent laboratory studies:  Lab Results  Component Value Date   HGB 7.9* 05/19/2015   HGB 8.0* 05/18/2015   HGB 10.9* 05/04/2015   Lab Results  Component Value Date   WBC 4.7 05/19/2015   PLT 101* 05/19/2015   Lab Results  Component Value Date   INR 1.02 05/04/2015   Lab Results  Component Value Date   NA 138 05/18/2015   K 4.6 05/18/2015   CL 111 05/18/2015   CO2 22 05/18/2015   BUN 19 05/18/2015   CREATININE 2.05* 05/19/2015   GLUCOSE 105* 05/18/2015    Discharge Medications:     Medication List    TAKE these medications        aspirin 81 MG chewable tablet  Chew  1 tablet (81 mg total) by mouth daily.     atorvastatin 80 MG tablet  Commonly known as:  LIPITOR  Take 1 tablet (80 mg total) by mouth daily at 6 PM.     enoxaparin 40 MG/0.4ML injection  Commonly known as:  LOVENOX  Inject 0.3 mLs (30 mg total) into the skin daily.     HYDROcodone-acetaminophen 10-325 MG per tablet  Commonly known as:  NORCO  Take 1 tablet by mouth every 6 (six) hours as needed for moderate pain.     ipratropium-albuterol 0.5-2.5 (3) MG/3ML Soln  Commonly known as:  DUONEB  Take 3 mLs by nebulization every 6 (six) hours as needed (shortness of breath).     metoprolol tartrate 25 MG tablet  Commonly known as:  LOPRESSOR  Take 0.5 tablets (12.5 mg total) by mouth 2 (two) times daily.     montelukast 10 MG tablet  Commonly known as:  SINGULAIR  Take 10 mg by mouth once.     nicotine 14 mg/24hr patch  Commonly known as:  NICODERM CQ - dosed in mg/24 hours  Place 1 patch (14 mg total) onto the skin daily.     nitroGLYCERIN 0.4 MG SL tablet  Commonly known as:  NITROSTAT   Place 1 tablet (0.4 mg total) under the tongue every 5 (five) minutes as needed for chest pain.     oxyCODONE 5 MG immediate release tablet  Commonly known as:  Oxy IR/ROXICODONE  Take 1-2 tablets (5-10 mg total) by mouth every 4 (four) hours as needed for severe pain or breakthrough pain.     QUEtiapine 200 MG tablet  Commonly known as:  SEROQUEL  Take 800 mg by mouth at bedtime.     sitaGLIPtin 50 MG tablet  Commonly known as:  JANUVIA  Take 50 mg by mouth daily.     ticagrelor 90 MG Tabs tablet  Commonly known as:  BRILINTA  Take 1 tablet (90 mg total) by mouth 2 (two) times daily.     varenicline 1 MG tablet  Commonly known as:  CHANTIX  Take 1 mg by mouth 2 (two) times daily.     VENTOLIN HFA 108 (90 BASE) MCG/ACT inhaler  Generic drug:  albuterol  Inhale 1-2 puffs into the lungs every 4 (four) hours as needed for wheezing or shortness of breath.        Diagnostic Studies: Dg Knee Right Port  05/17/2015   CLINICAL DATA:  Post right total knee replacement  EXAM: PORTABLE RIGHT KNEE - 1-2 VIEW  COMPARISON:  12/18/2014  FINDINGS: Bipartite right total knee arthroplasty noted with overlying skin staples and soft tissue gas/drains. No evidence for hardware failure. No fracture line seen. Tract from previous hardware noted within the proximal anterior tibia.  IMPRESSION: Expected postoperative appearance after right knee arthroplasty.   Electronically Signed   By: Conchita Paris M.D.   On: 05/17/2015 13:44    Disposition: 01-Home or Self Care      Discharge Instructions    Diet - low sodium heart healthy    Complete by:  As directed      Diet - low sodium heart healthy    Complete by:  As directed      Increase activity slowly    Complete by:  As directed      Increase activity slowly    Complete by:  As directed            Follow-up Information    Follow  up with Watt Climes., PA On 06/01/2015.   Specialty:  Physician Assistant   Why:  Appointment is at 9:45    Contact information:   [place] [id number] [phone]       Follow up with Dereck Leep, MD On 06/29/2015.   Specialty:  Orthopedic Surgery   Why:  Appointment is at 9:45   Contact information:   [place] [id number] [phone]        Signed: Watt Climes 05/19/2015, 11:34 AM

## 2015-05-19 NOTE — Progress Notes (Signed)
Explained to patient that in order for him to leave he needed to have a BM. He stated he did Monday night and he was not going to take anything to move his bowels and no matter what he is leaving when its time to be discharged. Dr Marry Guan in his PA [facility] are aware.

## 2015-05-19 NOTE — Progress Notes (Addendum)
Subjective: 2 Days Post-Op Procedure(s) (LRB): TOTAL KNEE ARTHROPLASTY, REMOVAL OF SPACER (Right) Patient reports pain as moderate.   Patient is well, but has had some minor complaints of Some increase tingling sensation to the plantar surface of right foot. Continue with physical therapy today.  Plan is to go Home after hospital stay. no nausea and no vomiting Patient denies any chest pains or shortness of breath. Objective: Vital signs in last 24 hours: Temp:  [97.6 F (36.4 C)-98.7 F (37.1 C)] 98.4 F (36.9 C) (07/27 0353) Pulse Rate:  [71-154] 77 (07/27 0353) Resp:  [18] 18 (07/27 0353) BP: (94-104)/(52-59) 100/52 mmHg (07/27 0353) SpO2:  [89 %-100 %] 89 % (07/27 0353) well approximated incision Heels are non tender and elevated off the bed using rolled towels Intake/Output from previous day: 07/26 0701 - 07/27 0700 In: 2221.7 [P.O.:240; I.V.:1981.7] Out: 840 [Urine:650; Drains:190] Intake/Output this shift: Total I/O In: -  Out: 80 [Drains:80]   Recent Labs  05/18/15 0418  HGB 8.0*    Recent Labs  05/18/15 0418  WBC 6.1  RBC 2.56*  HCT 23.8*  PLT 109*    Recent Labs  05/17/15 0714 05/18/15 0418  NA  --  138  K 4.3 4.6  CL  --  111  CO2  --  22  BUN  --  19  CREATININE  --  2.26*  GLUCOSE  --  105*  CALCIUM  --  7.9*   No results for input(s): LABPT, INR in the last 72 hours.  EXAM General - Patient is Alert, Appropriate and Oriented Extremity - Neurologically intact Neurovascular intact Sensation intact distally Intact pulses distally Dorsiflexion/Plantar flexion intact Dressing - scant drainage Motor Function - intact, moving foot and toes well on exam. Continues to be able to move lower extremities on his own.  Past Medical History  Diagnosis Date  . ALLERGIC RHINITIS 03/18/2009  . ASTHMA 06/24/2007  . BREAST PAIN, LEFT 07/21/2008  . DEPRESSION 06/24/2007  . DIABETES MELLITUS, TYPE II 07/21/2008  . HYPERLIPIDEMIA 07/21/2008  .  INSOMNIA-SLEEP DISORDER-UNSPEC 03/28/2010  . LOW BACK PAIN 09/03/2007  . NAUSEA 06/14/2010  . OSTEOARTHRITIS, KNEES, BILATERAL 08/25/2008  . PERIPHERAL EDEMA 07/21/2008  . SYMPTOM, ABNORMAL INVOLUNTARY MOVEMENT NEC 06/21/2007  . Wheezing 07/13/2009  . COPD (chronic obstructive pulmonary disease) 03/28/2012  . Colon polyps 03/28/2012  . GERD 08/25/2008    pt denies at PAT visit on 02/25/15  . ANXIETY 06/24/2007    pt denies at PAT visit on 02/25/15  . Vertigo 11/2014    pt has a bout every 3-4 months, no pattern  . CKD (chronic kidney disease), stage III   . NSTEMI, initial episode of care 03/11/2015    Mild Troponin ~0.45, transient Anterior ST-T changes  . CAD S/P percutaneous coronary angioplasty 03/11/2015    SEVERE SINGLE VESSEL CAD OF THE PROX LAD - 95%--> 0% post PCI Vision BMS 3.5 x 18 (3.75 mm); MILD-MODERATE (~50%) NONOBSTRUCTIVE LCX AND RCA STENOSES   . Tobacco abuse     Assessment/Plan: 2 Days Post-Op Procedure(s) (LRB): TOTAL KNEE ARTHROPLASTY, REMOVAL OF SPACER (Right) Active Problems:   Total knee replacement status  Estimated body mass index is 26.4 kg/(m^2) as calculated from the following:   Height as of this encounter: 5\' 10"  (1.778 m).   Weight as of this encounter: 83.462 kg (184 lb). Up with therapy Plan for discharge tomorrow Discharge home with home health  Labs: Pending this note DVT Prophylaxis - Aspirin, Lovenox, Foot Pumps  and TED hose Weight-Bearing as tolerated to right leg Patient needs a bowel movement. Patient states that he has had one but this has not been witnessed by any thin nursing staff. Patient states his been using his incentive spirometer but once again refuses to show the nurse how to use it. Portal care was off  this a.m. Patient told me that it had been off an hour after he went to the bathroom. However the nursing staff states that it is been off since midnight. He refused to wear it.  Patient continues to refuse AV 1 compression boots Hemovac DC'd  today. New dressing applied today.  Jillyn Ledger. [place] 05/19/2015, 6:56 AM

## 2015-05-19 NOTE — Progress Notes (Signed)
Patient refused polar care and wanted it removed. Patient denies pain and sleeping between care. Patient teaching giving to patient. Reminded patient that I would check later to see if he wanted placed.

## 2015-05-19 NOTE — Progress Notes (Signed)
In response to coding query:  Acute blood loss anemia  (additional diagnosis)  Giovonnie Trettel P. Holley Bouche M.D.

## 2015-05-19 NOTE — Progress Notes (Signed)
Orthopaedics-  I discussed discharge instructions with the patient in detail. He is to continue the TED hose for 6 weeks, removing the hose at bedtime but reapplying them in the AM. He is encouraged to continue using the PolarCare to decrease pain and swelling.  I again reviewed my conversation with Dr. Gwenlyn Found who recommended continuing the 81 mg aspirin. The patient is to restart his BRILINTA after completing 14 days of Lovenox. (Also discussed this information with his sister.) He is again encouraged to discontinue smoking.  He was instructed to contact the office if he had any questions or issues.  Oni Dietzman P. Holley Bouche M.D.

## 2015-05-19 NOTE — Clinical Documentation Improvement (Signed)
05/17/15 Op note..."PROCEDURE: Removal of the antibiotic impregnated spacer from the right knee, debridement, and right total knee arthroplasty"..."ESTIMATED BLOOD LOSS: 300 mL"... 05/19/15 progr note..."Labs: Pending this note"...  Ref. Range 05/18/2015 04:18 05/19/2015 05:33  Hemoglobin Latest Ref Range: 13.0-18.0 g/dL 8.0 (L) 7.9 (L)  HCT Latest Ref Range: 40.0-52.0 % 23.8 (L) 23.4 (L)   Pre-op  Ref. Range 05/04/2015 09:39  Hemoglobin Latest Ref Range: 13.0-18.0 g/dL 10.9 (L)  HCT Latest Ref Range: 40.0-52.0 % 32.2 (L)  TX: H&H daily x2;  For accurate Dx specificity & severity can cinical findings/ lab values be further specified for possible, probable or suspected condition being eval'd, mon'd & tx'd. Thank you  Possible Clinical Conditions?   Expected Acute Blood Loss Anemia  Acute Blood Loss Anemia  Acute on chronic blood loss anemia  Chronic blood loss anemia  Precipitous drop in Hematocrit  Other Condition________________  Cannot Clinically Determine  Supporting Information:  Signs and Symptoms: see above note  Thank You, Ermelinda Das, RN, BSN, CCDS Certified Clinical Documentation Specialist Pager: [place]: Health Information Management

## 2015-05-19 NOTE — Care Management Important Message (Signed)
Important Message  Patient Details  Name: WINSON EICHORN MRN: [id number] Date of Birth: [birth year]   Medicare Important Message Given:  Yes-second notification given    Darius Bump [place] 05/19/2015, 10:21 AM

## 2015-05-20 NOTE — Care Management (Signed)
Post discharge: notified Sonia Side with [facility] of patient discharge to home yesterday. Case closed.

## 2015-05-21 LAB — BODY FLUID CULTURE
CULTURE: NO GROWTH
CULTURE: NO GROWTH
CULTURE: NO GROWTH
CULTURE: NO GROWTH
Culture: NO GROWTH
Culture: NO GROWTH
Special Requests: 3
Special Requests: 4
Special Requests: 5
Special Requests: 7

## 2015-05-22 LAB — BODY FLUID CULTURE: SPECIAL REQUESTS: 6

## 2015-08-09 ENCOUNTER — Ambulatory Visit: Payer: Medicare Other | Admitting: Cardiology

## 2016-01-10 ENCOUNTER — Encounter (HOSPITAL_COMMUNITY): Payer: Self-pay | Admitting: Emergency Medicine

## 2016-01-10 ENCOUNTER — Inpatient Hospital Stay (HOSPITAL_COMMUNITY)
Admission: EM | Admit: 2016-01-10 | Discharge: 2016-01-11 | DRG: 639 | Disposition: A | Discharge status: Home / Self Care | Payer: Medicare Other | Attending: Internal Medicine | Admitting: Internal Medicine

## 2016-01-10 DIAGNOSIS — Z96651 Presence of right artificial knee joint: Secondary | ICD-10-CM | POA: Diagnosis present

## 2016-01-10 DIAGNOSIS — I252 Old myocardial infarction: Secondary | ICD-10-CM

## 2016-01-10 DIAGNOSIS — Z7982 Long term (current) use of aspirin: Secondary | ICD-10-CM | POA: Diagnosis not present

## 2016-01-10 DIAGNOSIS — Z886 Allergy status to analgesic agent status: Secondary | ICD-10-CM | POA: Diagnosis not present

## 2016-01-10 DIAGNOSIS — Z7901 Long term (current) use of anticoagulants: Secondary | ICD-10-CM

## 2016-01-10 DIAGNOSIS — E1101 Type 2 diabetes mellitus with hyperosmolarity with coma: Secondary | ICD-10-CM | POA: Diagnosis not present

## 2016-01-10 DIAGNOSIS — Z825 Family history of asthma and other chronic lower respiratory diseases: Secondary | ICD-10-CM | POA: Diagnosis not present

## 2016-01-10 DIAGNOSIS — Z885 Allergy status to narcotic agent status: Secondary | ICD-10-CM | POA: Diagnosis not present

## 2016-01-10 DIAGNOSIS — N183 Chronic kidney disease, stage 3 (moderate): Secondary | ICD-10-CM | POA: Diagnosis present

## 2016-01-10 DIAGNOSIS — J45909 Unspecified asthma, uncomplicated: Secondary | ICD-10-CM | POA: Diagnosis present

## 2016-01-10 DIAGNOSIS — F329 Major depressive disorder, single episode, unspecified: Secondary | ICD-10-CM | POA: Diagnosis present

## 2016-01-10 DIAGNOSIS — I2510 Atherosclerotic heart disease of native coronary artery without angina pectoris: Secondary | ICD-10-CM | POA: Diagnosis present

## 2016-01-10 DIAGNOSIS — Z9861 Coronary angioplasty status: Secondary | ICD-10-CM | POA: Diagnosis not present

## 2016-01-10 DIAGNOSIS — E860 Dehydration: Secondary | ICD-10-CM | POA: Diagnosis present

## 2016-01-10 DIAGNOSIS — M170 Bilateral primary osteoarthritis of knee: Secondary | ICD-10-CM | POA: Diagnosis present

## 2016-01-10 DIAGNOSIS — Z86010 Personal history of colonic polyps: Secondary | ICD-10-CM | POA: Diagnosis not present

## 2016-01-10 DIAGNOSIS — E1100 Type 2 diabetes mellitus with hyperosmolarity without nonketotic hyperglycemic-hyperosmolar coma (NKHHC): Principal | ICD-10-CM | POA: Diagnosis present

## 2016-01-10 DIAGNOSIS — R631 Polydipsia: Secondary | ICD-10-CM | POA: Diagnosis present

## 2016-01-10 DIAGNOSIS — E875 Hyperkalemia: Secondary | ICD-10-CM | POA: Diagnosis present

## 2016-01-10 DIAGNOSIS — Z8249 Family history of ischemic heart disease and other diseases of the circulatory system: Secondary | ICD-10-CM

## 2016-01-10 DIAGNOSIS — K219 Gastro-esophageal reflux disease without esophagitis: Secondary | ICD-10-CM | POA: Diagnosis present

## 2016-01-10 DIAGNOSIS — E785 Hyperlipidemia, unspecified: Secondary | ICD-10-CM | POA: Diagnosis present

## 2016-01-10 DIAGNOSIS — E1122 Type 2 diabetes mellitus with diabetic chronic kidney disease: Secondary | ICD-10-CM | POA: Diagnosis present

## 2016-01-10 DIAGNOSIS — Z8261 Family history of arthritis: Secondary | ICD-10-CM

## 2016-01-10 DIAGNOSIS — M545 Low back pain, unspecified: Secondary | ICD-10-CM | POA: Diagnosis present

## 2016-01-10 DIAGNOSIS — F17210 Nicotine dependence, cigarettes, uncomplicated: Secondary | ICD-10-CM | POA: Diagnosis present

## 2016-01-10 DIAGNOSIS — Z79899 Other long term (current) drug therapy: Secondary | ICD-10-CM

## 2016-01-10 DIAGNOSIS — R739 Hyperglycemia, unspecified: Secondary | ICD-10-CM | POA: Insufficient documentation

## 2016-01-10 DIAGNOSIS — E1165 Type 2 diabetes mellitus with hyperglycemia: Secondary | ICD-10-CM | POA: Diagnosis present

## 2016-01-10 DIAGNOSIS — J449 Chronic obstructive pulmonary disease, unspecified: Secondary | ICD-10-CM | POA: Diagnosis present

## 2016-01-10 DIAGNOSIS — F419 Anxiety disorder, unspecified: Secondary | ICD-10-CM | POA: Diagnosis present

## 2016-01-10 DIAGNOSIS — R358 Other polyuria: Secondary | ICD-10-CM | POA: Diagnosis present

## 2016-01-10 LAB — URINE MICROSCOPIC-ADD ON: Bacteria, UA: NONE SEEN

## 2016-01-10 LAB — BASIC METABOLIC PANEL
ANION GAP: 9 (ref 5–15)
Anion gap: 12 (ref 5–15)
BUN: 21 mg/dL — AB (ref 6–20)
BUN: 18 mg/dL (ref 6–20)
CALCIUM: 9.1 mg/dL (ref 8.9–10.3)
CALCIUM: 8.6 mg/dL — AB (ref 8.9–10.3)
CO2: 16 mmol/L — AB (ref 22–32)
CO2: 18 mmol/L — ABNORMAL LOW (ref 22–32)
Chloride: 89 mmol/L — ABNORMAL LOW (ref 101–111)
Chloride: 108 mmol/L (ref 101–111)
Creatinine, Ser: 2.33 mg/dL — ABNORMAL HIGH (ref 0.61–1.24)
Creatinine, Ser: 1.94 mg/dL — ABNORMAL HIGH (ref 0.61–1.24)
GFR calc non Af Amer: 29 mL/min — ABNORMAL LOW (ref >60)
GFR, EST AFRICAN AMERICAN: 34 mL/min — AB (ref >60)
GFR, EST AFRICAN AMERICAN: 42 mL/min — AB (ref >60)
GFR, EST NON AFRICAN AMERICAN: 36 mL/min — AB (ref >60)
GLUCOSE: 407 mg/dL — AB (ref 65–99)
Glucose, Bld: 1377 mg/dL (ref 65–99)
POTASSIUM: 3.8 mmol/L (ref 3.5–5.1)
Potassium: 5.7 mmol/L — ABNORMAL HIGH (ref 3.5–5.1)
Sodium: 117 mmol/L — CL (ref 135–145)
Sodium: 135 mmol/L (ref 135–145)

## 2016-01-10 LAB — URINALYSIS, ROUTINE W REFLEX MICROSCOPIC
BILIRUBIN URINE: NEGATIVE
Glucose, UA: >1000 mg/dL — AB
Ketones, ur: NEGATIVE mg/dL
Leukocytes, UA: NEGATIVE
Nitrite: NEGATIVE
PROTEIN: NEGATIVE mg/dL
Specific Gravity, Urine: 1.031 — ABNORMAL HIGH (ref 1.005–1.030)
pH: 5.5 (ref 5.0–8.0)

## 2016-01-10 LAB — MAGNESIUM
MAGNESIUM: 2.1 mg/dL (ref 1.7–2.4)
MAGNESIUM: 2 mg/dL (ref 1.7–2.4)

## 2016-01-10 LAB — CBC
HCT: 42.9 % (ref 39.0–52.0)
Hemoglobin: 13.9 g/dL (ref 13.0–17.0)
MCH: 31.7 pg (ref 26.0–34.0)
MCHC: 32.4 g/dL (ref 30.0–36.0)
MCV: 97.7 fL (ref 78.0–100.0)
PLATELETS: 142 10*3/uL — AB (ref 150–400)
RBC: 4.39 MIL/uL (ref 4.22–5.81)
RDW: 14.1 % (ref 11.5–15.5)
WBC: 6.3 10*3/uL (ref 4.0–10.5)

## 2016-01-10 LAB — PHOSPHORUS
PHOSPHORUS: 4 mg/dL (ref 2.5–4.6)
PHOSPHORUS: 3 mg/dL (ref 2.5–4.6)

## 2016-01-10 LAB — CBG MONITORING, ED
Glucose-Capillary: >600 mg/dL (ref 65–99)
Glucose-Capillary: >600 mg/dL (ref 65–99)
Glucose-Capillary: >600 mg/dL (ref 65–99)

## 2016-01-10 MED ORDER — QUETIAPINE FUMARATE 300 MG PO TABS
900.0000 mg | ORAL_TABLET | Freq: Every day | ORAL | Status: DC
  Administered 2016-01-10: 900 mg via ORAL
  Filled 2016-01-10 (×2): qty 3

## 2016-01-10 MED ORDER — SODIUM CHLORIDE 0.9 % IV SOLN
INTRAVENOUS | Status: DC
  Administered 2016-01-10: 5.4 [IU]/h via INTRAVENOUS
  Administered 2016-01-10: 6.4 [IU]/h via INTRAVENOUS
  Administered 2016-01-11: 2.2 [IU]/h via INTRAVENOUS
  Filled 2016-01-10: qty 2.5

## 2016-01-10 MED ORDER — QUETIAPINE FUMARATE 100 MG PO TABS
900.0000 mg | ORAL_TABLET | Freq: Every day | ORAL | Status: DC
  Filled 2016-01-10: qty 9

## 2016-01-10 MED ORDER — METOPROLOL TARTRATE 25 MG PO TABS
25.0000 mg | ORAL_TABLET | ORAL | Status: DC
  Administered 2016-01-11: 25 mg via ORAL
  Filled 2016-01-10: qty 1

## 2016-01-10 MED ORDER — SODIUM CHLORIDE 0.9 % IV BOLUS (SEPSIS)
1000.0000 mL | Freq: Once | INTRAVENOUS | Status: AC
  Administered 2016-01-10: 1000 mL via INTRAVENOUS

## 2016-01-10 MED ORDER — ASPIRIN EC 81 MG PO TBEC
81.0000 mg | DELAYED_RELEASE_TABLET | Freq: Every day | ORAL | Status: DC
  Administered 2016-01-10 – 2016-01-11 (×2): 81 mg via ORAL
  Filled 2016-01-10 (×3): qty 1

## 2016-01-10 MED ORDER — DEXTROSE-NACL 5-0.45 % IV SOLN
INTRAVENOUS | Status: DC
  Administered 2016-01-10: 22:00:00 via INTRAVENOUS

## 2016-01-10 MED ORDER — NITROGLYCERIN 0.4 MG SL SUBL
0.4000 mg | SUBLINGUAL_TABLET | SUBLINGUAL | Status: DC | PRN
Start: 2016-01-10 — End: 2016-01-11

## 2016-01-10 MED ORDER — HYDROCODONE-ACETAMINOPHEN 10-325 MG PO TABS
1.0000 | ORAL_TABLET | Freq: Four times a day (QID) | ORAL | Status: DC | PRN
Start: 2016-01-10 — End: 2016-01-11
  Administered 2016-01-10: 1 via ORAL
  Filled 2016-01-10 (×2): qty 1

## 2016-01-10 MED ORDER — ONDANSETRON HCL 4 MG PO TABS: 4.0000 mg | ORAL_TABLET | Freq: Four times a day (QID) | ORAL | Status: DC | PRN

## 2016-01-10 MED ORDER — ONDANSETRON HCL 4 MG/2ML IJ SOLN: 4.0000 mg | Freq: Four times a day (QID) | INTRAMUSCULAR | Status: DC | PRN

## 2016-01-10 MED ORDER — SODIUM CHLORIDE 0.9 % IV BOLUS (SEPSIS)
2000.0000 mL | Freq: Once | INTRAVENOUS | Status: AC
  Administered 2016-01-10: 2000 mL via INTRAVENOUS

## 2016-01-10 MED ORDER — LINAGLIPTIN 5 MG PO TABS
5.0000 mg | ORAL_TABLET | Freq: Every day | ORAL | Status: DC
  Administered 2016-01-10 – 2016-01-11 (×2): 5 mg via ORAL
  Filled 2016-01-10 (×3): qty 1

## 2016-01-10 MED ORDER — IPRATROPIUM-ALBUTEROL 0.5-2.5 (3) MG/3ML IN SOLN
3.0000 mL | Freq: Four times a day (QID) | RESPIRATORY_TRACT | Status: DC | PRN
  Administered 2016-01-10: 3 mL via RESPIRATORY_TRACT
  Filled 2016-01-10: qty 3

## 2016-01-10 MED ORDER — ATORVASTATIN CALCIUM 40 MG PO TABS: 80.0000 mg | ORAL_TABLET | Freq: Every day | ORAL | Status: DC

## 2016-01-10 MED ORDER — DEXTROSE-NACL 5-0.45 % IV SOLN: INTRAVENOUS | Status: DC

## 2016-01-10 MED ORDER — SODIUM CHLORIDE 0.9 % IV SOLN
INTRAVENOUS | Status: AC
  Administered 2016-01-10: 22:00:00 via INTRAVENOUS

## 2016-01-10 MED ORDER — NICOTINE 14 MG/24HR TD PT24
14.0000 mg | MEDICATED_PATCH | Freq: Every day | TRANSDERMAL | Status: DC
  Administered 2016-01-10: 14 mg via TRANSDERMAL
  Filled 2016-01-10: qty 1

## 2016-01-10 MED ORDER — TICAGRELOR 90 MG PO TABS
90.0000 mg | ORAL_TABLET | Freq: Two times a day (BID) | ORAL | Status: DC
  Administered 2016-01-10 – 2016-01-11 (×2): 90 mg via ORAL
  Filled 2016-01-10 (×4): qty 1

## 2016-01-10 MED ORDER — SODIUM CHLORIDE 0.9 % IV SOLN
INTRAVENOUS | Status: DC
  Administered 2016-01-10: 20:00:00 via INTRAVENOUS

## 2016-01-10 MED ORDER — ALBUTEROL SULFATE (2.5 MG/3ML) 0.083% IN NEBU: 2.5000 mg | INHALATION_SOLUTION | Freq: Four times a day (QID) | RESPIRATORY_TRACT | Status: DC | PRN

## 2016-01-10 MED ORDER — ENOXAPARIN SODIUM 40 MG/0.4ML ~~LOC~~ SOLN
40.0000 mg | SUBCUTANEOUS | Status: DC
  Administered 2016-01-10: 40 mg via SUBCUTANEOUS
  Filled 2016-01-10 (×2): qty 0.4

## 2016-01-10 MED ORDER — MONTELUKAST SODIUM 10 MG PO TABS
10.0000 mg | ORAL_TABLET | Freq: Once | ORAL | Status: AC
  Administered 2016-01-10: 10 mg via ORAL
  Filled 2016-01-10 (×2): qty 1

## 2016-01-10 MED ORDER — INSULIN ASPART 100 UNIT/ML ~~LOC~~ SOLN
10.0000 [IU] | Freq: Once | SUBCUTANEOUS | Status: AC
  Administered 2016-01-10: 10 [IU] via SUBCUTANEOUS
  Filled 2016-01-10: qty 1

## 2016-01-10 MED ORDER — NICOTINE 14 MG/24HR TD PT24: 14.0000 mg | MEDICATED_PATCH | Freq: Every day | TRANSDERMAL | Status: DC | PRN

## 2016-01-10 MED ORDER — ALBUTEROL SULFATE HFA 108 (90 BASE) MCG/ACT IN AERS: 1.0000 | INHALATION_SPRAY | RESPIRATORY_TRACT | Status: DC | PRN

## 2016-01-10 MED ORDER — MECLIZINE HCL 12.5 MG PO TABS
12.5000 mg | ORAL_TABLET | Freq: Every day | ORAL | Status: DC | PRN
  Filled 2016-01-10: qty 1

## 2016-01-10 NOTE — ED Notes (Signed)
Sodium 117 and CBG >1200

## 2016-01-10 NOTE — ED Notes (Signed)
Pt states checked blood sugar at home and read <500 while simultaneously drinking a large Cheerwine from cookout.

## 2016-01-10 NOTE — ED Provider Notes (Signed)
CSN: [id number]     Arrival date & time 01/10/16  1457 History   First MD Initiated Contact with Patient 01/10/16 1708     Chief Complaint  Patient presents with  . Hyperglycemia     (Consider location/radiation/quality/duration/timing/severity/associated sxs/prior Treatment) HPI Chase Gibson is a 60 y.o. male with history of DM II, CAD with stenting, Cholecystectomy, comes in for evaluation of hyperglycemia. Patient reports feeling "crummy" this past weekend and notes that he will usually feel this way when his sugars are high. He does report drinking more grape juice and sugary drinks over the past 2 weeks. Admits to drinkingHe reports that he has been compliant with his daily dose of Januvia 50 mg, but has not taken this today. Reports he was recently seen at his PCPs office 2 weeks ago for upper respiratory infection and placed on prednisone taper and antibiotics. He endorses associated polyuria, polydipsia and chapped lips. He denies any fevers, chills, nausea, vomiting, abdominal pain, chest pain or shortness of breath.  Past Medical History  Diagnosis Date  . ALLERGIC RHINITIS 03/18/2009  . ASTHMA 06/24/2007  . BREAST PAIN, LEFT 07/21/2008  . DEPRESSION 06/24/2007  . DIABETES MELLITUS, TYPE II 07/21/2008  . HYPERLIPIDEMIA 07/21/2008  . INSOMNIA-SLEEP DISORDER-UNSPEC 03/28/2010  . LOW BACK PAIN 09/03/2007  . NAUSEA 06/14/2010  . OSTEOARTHRITIS, KNEES, BILATERAL 08/25/2008  . PERIPHERAL EDEMA 07/21/2008  . SYMPTOM, ABNORMAL INVOLUNTARY MOVEMENT NEC 06/21/2007  . Wheezing 07/13/2009  . COPD (chronic obstructive pulmonary disease) ([place]) 03/28/2012  . Colon polyps 03/28/2012  . GERD 08/25/2008    pt denies at PAT visit on 02/25/15  . ANXIETY 06/24/2007    pt denies at PAT visit on 02/25/15  . Vertigo 11/2014    pt has a bout every 3-4 months, no pattern  . CKD (chronic kidney disease), stage III   . NSTEMI, initial episode of care ([place]) 03/11/2015    Mild Troponin ~0.45, transient Anterior ST-T  changes  . CAD S/P percutaneous coronary angioplasty 03/11/2015    SEVERE SINGLE VESSEL CAD OF THE PROX LAD - 95%--> 0% post PCI Vision BMS 3.5 x 18 (3.75 mm); MILD-MODERATE (~50%) NONOBSTRUCTIVE LCX AND RCA STENOSES   . Tobacco abuse    Past Surgical History  Procedure Laterality Date  . Right knee surgury      x 9  . Rotator cuff repair Bilateral     bilateral  . Inguinal herniorrhapy Bilateral     1979 & 1997  . Appendectomy    . Cholecystectomy    . Tonsillectomy    . Total knee arthroplasty Right     right  . Knee arthroscopy Left     left  . Cardiac catheterization N/A 03/10/2015    Procedure: Left Heart Cath and Coronary Angiography;  Surgeon: Sherren Mocha, MD;  Location: [place] [facility];  Service: Cardiovascular;  Laterality: N/A;  . Cardiac catheterization N/A 03/10/2015    Procedure: Coronary Stent Intervention;  Surgeon: Sherren Mocha, MD;  Location: [place] [facility];  Service: Cardiovascular;  Laterality: N/A;  . Removal of right total knee implants for periprosthetic infection  10/02/2014  . Right knee arthrotomy, debridement, and reinsertion of antibiotic cement spacer  12/18/2014  . Total knee arthroplasty Right 05/17/2015    Procedure: TOTAL KNEE ARTHROPLASTY, REMOVAL OF SPACER;  Surgeon: Dereck Leep, MD;  Location: [facility];  Service: Orthopedics;  Laterality: Right;   Family History  Problem Relation Age of Onset  . Coronary artery disease Other   .  Emphysema Mother     never smoker but spouse smoked  . Allergies Son   . Asthma Maternal Grandmother   . Rheum arthritis Sister    Social History  Substance Use Topics  . Smoking status: Current Every Day Smoker -- 1.00 packs/day for 35 years    Types: Cigarettes    Last Attempt to Quit: 12/02/2012  . Smokeless tobacco: Never Used  . Alcohol Use: No    Review of Systems A 10 point review of systems was completed and was negative except for pertinent positives and negatives as mentioned in the history  of present illness     Allergies  Tramadol and Nsaids  Home Medications   Prior to Admission medications   Medication Sig Start Date End Date Taking? Authorizing Provider  albuterol (VENTOLIN HFA) 108 (90 BASE) MCG/ACT inhaler Inhale 1-2 puffs into the lungs every 4 (four) hours as needed for wheezing or shortness of breath.     Historical Provider, MD  aspirin 81 MG chewable tablet Chew 1 tablet (81 mg total) by mouth daily. 03/11/15   Erlene Quan, PA-C  atorvastatin (LIPITOR) 80 MG tablet Take 1 tablet (80 mg total) by mouth daily at 6 PM. 03/11/15   Erlene Quan, PA-C  enoxaparin (LOVENOX) 40 MG/0.4ML injection Inject 0.3 mLs (30 mg total) into the skin daily. 05/19/15   Watt Climes, PA  HYDROcodone-acetaminophen (NORCO) 10-325 MG per tablet Take 1 tablet by mouth every 6 (six) hours as needed for moderate pain. 03/28/12   Biagio Borg, MD  ipratropium-albuterol (DUONEB) 0.5-2.5 (3) MG/3ML SOLN Take 3 mLs by nebulization every 6 (six) hours as needed (shortness of breath).    Historical Provider, MD  metoprolol tartrate (LOPRESSOR) 25 MG tablet Take 0.5 tablets (12.5 mg total) by mouth 2 (two) times daily. Patient taking differently: Take 25 mg by mouth every morning.  03/11/15   Doreene Burke Kilroy, PA-C  montelukast (SINGULAIR) 10 MG tablet Take 10 mg by mouth once.  09/28/11   Biagio Borg, MD  nicotine (NICODERM CQ - DOSED IN MG/24 HOURS) 14 mg/24hr patch Place 1 patch (14 mg total) onto the skin daily. Patient taking differently: Place 14 mg onto the skin daily as needed. Pt uses when in hosptial 03/11/15   Erlene Quan, PA-C  nitroGLYCERIN (NITROSTAT) 0.4 MG SL tablet Place 1 tablet (0.4 mg total) under the tongue every 5 (five) minutes as needed for chest pain. 03/12/15   Erlene Quan, PA-C  oxyCODONE (OXY IR/ROXICODONE) 5 MG immediate release tablet Take 1-2 tablets (5-10 mg total) by mouth every 4 (four) hours as needed for severe pain or breakthrough pain. 05/19/15   Watt Climes, PA   QUEtiapine (SEROQUEL) 200 MG tablet Take 800 mg by mouth at bedtime.     Historical Provider, MD  sitaGLIPtin (JANUVIA) 50 MG tablet Take 50 mg by mouth daily.    Historical Provider, MD  ticagrelor (BRILINTA) 90 MG TABS tablet Take 1 tablet (90 mg total) by mouth 2 (two) times daily. 03/11/15   Erlene Quan, PA-C  varenicline (CHANTIX) 1 MG tablet Take 1 mg by mouth 2 (two) times daily.    Historical Provider, MD   BP 118/76 mmHg  Pulse 70  Temp(Src) 98 F (36.7 C) (Oral)  Resp 14  SpO2 99% Physical Exam  Constitutional: He is oriented to person, place, and time. He appears well-developed and well-nourished.  Alert and oriented, overall well appearing white male.  HENT:  Head: Normocephalic and atraumatic.  Dry mucous membranes  Eyes: Conjunctivae are normal. Pupils are equal, round, and reactive to light. Right eye exhibits no discharge. Left eye exhibits no discharge. No scleral icterus.  Neck: Neck supple.  Cardiovascular: Normal rate, regular rhythm and normal heart sounds.   Pulmonary/Chest: Effort normal and breath sounds normal. No respiratory distress. He has no wheezes. He has no rales.  Abdominal: Soft. He exhibits no distension and no mass. There is no tenderness. There is no rebound and no guarding.  Musculoskeletal: He exhibits no tenderness.  Neurological: He is alert and oriented to person, place, and time.  Cranial Nerves II-XII grossly intact  Skin: Skin is warm and dry. No rash noted.  Psychiatric: He has a normal mood and affect. His behavior is normal. Judgment and thought content normal.  Nursing note and vitals reviewed.   ED Course  Procedures (including critical care time) Labs Review Labs Reviewed  BASIC METABOLIC PANEL - Abnormal; Notable for the following:    Sodium 117 (*)    Potassium 5.7 (*)    Chloride 89 (*)    CO2 16 (*)    Glucose, Bld 1377 (*)    BUN 21 (*)    Creatinine, Ser 2.33 (*)    GFR calc non Af Amer 29 (*)    GFR calc Af Amer  34 (*)    All other components within normal limits  CBC - Abnormal; Notable for the following:    Platelets 142 (*)    All other components within normal limits  CBG MONITORING, ED - Abnormal; Notable for the following:    Glucose-Capillary >600 (*)    All other components within normal limits  URINALYSIS, ROUTINE W REFLEX MICROSCOPIC (NOT AT [facility])    Imaging Review No results found. I have personally reviewed and evaluated these images and lab results as part of my medical decision-making.   EKG Interpretation None     Filed Vitals:   01/10/16 1519 01/10/16 1717 01/10/16 1730  BP: 122/74 118/76 141/93  Pulse: 79 70 71  Temp: 98 F (36.7 C)    TempSrc: Oral    Resp: 16 14 26   SpO2: 96% 99% 100%    MDM  Chase Gibson is a 60 y.o. male with history of diabetes comes in for evaluation of hyperglycemia. Patient endorses being compliant with medication, Januvia 50 mg. Has had increased sugary drink intake. On arrival, he is hemodynamically stable, afebrile. AAO x 4. Initial glucose is 1377. There is no gap, but patient does appear dehydrated. Creatinine 2.33, potassium 5.7, CO2 16, sodium 117 At this time, patient placed on glucose stabilizer, will consult hospitalist for medical admission. Discussed my attending, Dr. Wilson Singer who also saw patient and agrees with medical admission Discussed with hospitalist, Dr. Olevia Bowens, will see in the emergency department. Patient admitted to stepdown medical bed.  Final diagnoses:  Hyperglycemia        Comer Locket, PA-C 01/10/16 2027  Virgel Manifold, MD 01/14/16 [id number]  Virgel Manifold, MD 01/14/16 [phone]

## 2016-01-10 NOTE — ED Notes (Signed)
Hospitalist at bedside 

## 2016-01-10 NOTE — H&P (Signed)
[facility] History and Physical  Chase Gibson [id number] DOB: 03-19-1956 DOA: 01/10/2016  Referring physician: Virgel Manifold, M.D. PCP: Gara Kroner, MD   Chief Complaint: High blood sugar.  HPI: Chase Gibson is a 60 y.o. male with a past medical history of asthma, depression, anxiety, type 2 diabetes, hyperlipidemia, CAD, COPD, osteoarthritis of the knees, chronic low back pain, GERD, CKD was coming to the emergency department due to not feeling well over the past few days.  Per patient, he usually feels like this when his blood glucose are high. He states that he was seen by his primary care doctor about 2 weeks ago and was put on prednisone and antibiotics due to a respiratory infection. Since then, the patient has noticed progressively worse polyuria, polydipsia, dry lips and dry mouth. He states that he has been drinking lots of grape juice and other sugary drinks over the last 2 weeks due to his increased thirst. However, he realizes that he was making his symptoms worse. Initial blood glucose was 1377 mg/dL.  He denies any constitutional symptoms other than fatigue, fever, headache, dyspnea, chest pain, abdominal pain, nausea, emesis, diarrhea or GU symptoms.   Review of Systems:  Constitutional: Positive fatigue No weight loss, night sweats, Fevers, chills.  HEENT:  No headaches, Difficulty swallowing,Tooth/dental problems,Sore throat,  No sneezing, itching, ear ache, nasal congestion, post nasal drip,  Cardio-vascular:  No chest pain, Orthopnea, PND, swelling in lower extremities, anasarca, dizziness, palpitations  GI:  No heartburn, indigestion, abdominal pain, nausea, vomiting, diarrhea, change in bowel habits, loss of appetite  Resp:  No shortness of breath with exertion or at rest. No excess mucus, no productive cough, No non-productive cough, No coughing up of blood.No change in color of mucus.No wheezing.No chest wall deformity  Skin:  no rash or  lesions.  GU:  no dysuria, change in color of urine, no urgency or frequency. No flank pain.  Musculoskeletal:  No joint pain or swelling. No decreased range of motion. No back pain.  Psych:  No change in mood or affect. No depression or anxiety. No memory loss.  Endocrine: As above mentioned. Past Medical History  Diagnosis Date  . ALLERGIC RHINITIS 03/18/2009  . ASTHMA 06/24/2007  . BREAST PAIN, LEFT 07/21/2008  . DEPRESSION 06/24/2007  . DIABETES MELLITUS, TYPE II 07/21/2008  . HYPERLIPIDEMIA 07/21/2008  . INSOMNIA-SLEEP DISORDER-UNSPEC 03/28/2010  . LOW BACK PAIN 09/03/2007  . NAUSEA 06/14/2010  . OSTEOARTHRITIS, KNEES, BILATERAL 08/25/2008  . PERIPHERAL EDEMA 07/21/2008  . SYMPTOM, ABNORMAL INVOLUNTARY MOVEMENT NEC 06/21/2007  . Wheezing 07/13/2009  . COPD (chronic obstructive pulmonary disease) ([place]) 03/28/2012  . Colon polyps 03/28/2012  . GERD 08/25/2008    pt denies at PAT visit on 02/25/15  . ANXIETY 06/24/2007    pt denies at PAT visit on 02/25/15  . Vertigo 11/2014    pt has a bout every 3-4 months, no pattern  . CKD (chronic kidney disease), stage III   . NSTEMI, initial episode of care ([place]) 03/11/2015    Mild Troponin ~0.45, transient Anterior ST-T changes  . CAD S/P percutaneous coronary angioplasty 03/11/2015    SEVERE SINGLE VESSEL CAD OF THE PROX LAD - 95%--> 0% post PCI Vision BMS 3.5 x 18 (3.75 mm); MILD-MODERATE (~50%) NONOBSTRUCTIVE LCX AND RCA STENOSES   . Tobacco abuse    Past Surgical History  Procedure Laterality Date  . Right knee surgury      x 9  . Rotator cuff repair Bilateral  bilateral  . Inguinal herniorrhapy Bilateral     [place]  . Appendectomy    . Cholecystectomy    . Tonsillectomy    . Total knee arthroplasty Right     right  . Knee arthroscopy Left     left  . Cardiac catheterization N/A 03/10/2015    Procedure: Left Heart Cath and Coronary Angiography;  Surgeon: Sherren Mocha, MD;  Location: [place] [facility];  Service: Cardiovascular;   Laterality: N/A;  . Cardiac catheterization N/A 03/10/2015    Procedure: Coronary Stent Intervention;  Surgeon: Sherren Mocha, MD;  Location: [place] [facility];  Service: Cardiovascular;  Laterality: N/A;  . Removal of right total knee implants for periprosthetic infection  10/02/2014  . Right knee arthrotomy, debridement, and reinsertion of antibiotic cement spacer  12/18/2014  . Total knee arthroplasty Right 05/17/2015    Procedure: TOTAL KNEE ARTHROPLASTY, REMOVAL OF SPACER;  Surgeon: Dereck Leep, MD;  Location: [facility];  Service: Orthopedics;  Laterality: Right;   Social History:  reports that he has been smoking Cigarettes.  He has a 35 pack-year smoking history. He has never used smokeless tobacco. He reports that he does not drink alcohol or use illicit drugs.  Allergies  Allergen Reactions  . Tramadol Itching  . Nsaids Nausea And Vomiting and Rash    Family History  Problem Relation Age of Onset  . Coronary artery disease Other   . Emphysema Mother     never smoker but spouse smoked  . Allergies Son   . Asthma Maternal Grandmother   . Rheum arthritis Sister     Prior to Admission medications   Medication Sig Start Date End Date Taking? Authorizing Provider  albuterol (VENTOLIN HFA) 108 (90 BASE) MCG/ACT inhaler Inhale 1-2 puffs into the lungs every 4 (four) hours as needed for wheezing or shortness of breath.    Yes Historical Provider, MD  aspirin 81 MG chewable tablet Chew 1 tablet (81 mg total) by mouth daily. 03/11/15  Yes Luke K Kilroy, PA-C  atorvastatin (LIPITOR) 80 MG tablet Take 1 tablet (80 mg total) by mouth daily at 6 PM. 03/11/15  Yes Erlene Quan, PA-C  HYDROcodone-acetaminophen (NORCO) 10-325 MG per tablet Take 1 tablet by mouth every 6 (six) hours as needed for moderate pain. 03/28/12  Yes Biagio Borg, MD  ipratropium-albuterol (DUONEB) 0.5-2.5 (3) MG/3ML SOLN Take 3 mLs by nebulization every 6 (six) hours as needed (shortness of breath).   Yes Historical  Provider, MD  meclizine (ANTIVERT) 12.5 MG tablet Take 12.5 mg by mouth daily as needed for dizziness.  11/09/15  Yes Historical Provider, MD  metoprolol tartrate (LOPRESSOR) 25 MG tablet Take 0.5 tablets (12.5 mg total) by mouth 2 (two) times daily. Patient taking differently: Take 25 mg by mouth every morning.  03/11/15  Yes Luke K Kilroy, PA-C  montelukast (SINGULAIR) 10 MG tablet Take 10 mg by mouth once.  09/28/11  Yes Biagio Borg, MD  nicotine (NICODERM CQ - DOSED IN MG/24 HOURS) 14 mg/24hr patch Place 1 patch (14 mg total) onto the skin daily. Patient taking differently: Place 14 mg onto the skin daily as needed. Pt uses when in hosptial 03/11/15  Yes Luke K Kilroy, PA-C  nitroGLYCERIN (NITROSTAT) 0.4 MG SL tablet Place 1 tablet (0.4 mg total) under the tongue every 5 (five) minutes as needed for chest pain. 03/12/15  Yes Luke K Kilroy, PA-C  QUEtiapine (SEROQUEL) 300 MG tablet Take 900 mg by mouth  at bedtime. 11/19/15  Yes Historical Provider, MD  sitaGLIPtin (JANUVIA) 50 MG tablet Take 50 mg by mouth daily.   Yes Historical Provider, MD  ticagrelor (BRILINTA) 90 MG TABS tablet Take 1 tablet (90 mg total) by mouth 2 (two) times daily. 03/11/15  Yes Luke K Kilroy, PA-C  enoxaparin (LOVENOX) 40 MG/0.4ML injection Inject 0.3 mLs (30 mg total) into the skin daily. 05/19/15   Watt Climes, PA  oxyCODONE (OXY IR/ROXICODONE) 5 MG immediate release tablet Take 1-2 tablets (5-10 mg total) by mouth every 4 (four) hours as needed for severe pain or breakthrough pain. 05/19/15   Watt Climes, PA   Physical Exam: Filed Vitals:   01/10/16 1745 01/10/16 1800 01/10/16 1830 01/10/16 1900  BP:  118/73 137/71 134/75  Pulse:  67 68 62  Temp:      TempSrc:      Resp: 19 16 19 21   SpO2:  100% 100% 98%    Wt Readings from Last 3 Encounters:  05/17/15 83.462 kg (184 lb)  05/12/15 83.462 kg (184 lb)  05/04/15 81.647 kg (180 lb)    General:  Appears calm and comfortable Eyes: PERRL, normal lids, irises &  conjunctiva ENT: grossly normal hearing, lips & tongue. Oral mucosa was very dry. Neck: no LAD, masses or thyromegaly Cardiovascular: RRR, no m/r/g. No LE edema. Telemetry: SR, no arrhythmias  Respiratory: CTA bilaterally, no w/r/r. Normal respiratory effort. Abdomen: Bowel sounds +, soft, ntnd Skin: no rash or induration seen on limited exam Musculoskeletal: grossly normal tone BUE/BLE Psychiatric: grossly normal mood and affect, speech fluent and appropriate Neurologic: Awake, alert, oriented 3, grossly non-focal.          Labs on Admission:  Basic Metabolic Panel:  Recent Labs Lab 01/10/16 1607  NA 117*  K 5.7*  CL 89*  CO2 16*  GLUCOSE 1377*  BUN 21*  CREATININE 2.33*  CALCIUM 9.1  MG 2.1  PHOS 4.0   CBC:  Recent Labs Lab 01/10/16 1607  WBC 6.3  HGB 13.9  HCT 42.9  MCV 97.7  PLT 142*    BNP (last 3 results)  Recent Labs  03/10/15 0322  BNP 164.2*    CBG:  Recent Labs Lab 01/10/16 1528 01/10/16 1836  GLUCAP >600* >600*      Assessment/Plan Principal Problem:   Hyperosmolar non-ketotic state in patient with type 2 diabetes mellitus ([place]) Admit to a stepdown/inpatient. Continue IV fluids. Continue insulin infusion. Keep nothing by mouth for now.  Will resume diet later since the patient does not have any particular GI symptoms. Monitor CBG, electrolytes, BUN/creatinine.  Active Problems:  Hyperkalemia Secondary to dehydration and hyperglycemia. Continue IV fluids, IV insulin infusion and monitor electrolytes.    GERD Pantoprazole 40 mg by mouth daily.    LOW BACK PAIN Continue hydrocodone as needed.    COPD Continue albuterol MDI and duonebs as needed.    CAD S/P LAD BMS 03/10/15 Stable. Continue aspirin and beta blocker.      Code Status: Full code. DVT Prophylaxis: Lovenox SQ. Family Communication:  Disposition Plan: Admit to the stepdown for IV fluids and IV insulin.  Time spent: Over 70 minutes were spent in the  process of this admission.  Reubin Milan, M.D. [facility] Pager [phone].

## 2016-01-10 NOTE — ED Notes (Signed)
PA at bedside.

## 2016-01-10 NOTE — ED Notes (Signed)
Per [place] NT, Pt refusing take off hat, shoes, etc.

## 2016-01-10 NOTE — ED Notes (Signed)
Patient made aware of urine sample. Urinal at bedside.

## 2016-01-10 NOTE — ED Notes (Signed)
Pt states that he was feeling generally weak today took his cbg and it was >500 on his meter. Pt drinking soda when presented to ed. Takes Januvia for DM. Alert and oriented.

## 2016-01-10 NOTE — ED Notes (Signed)
Message sent to pharmacy to obtain IV insulin

## 2016-01-10 NOTE — ED Notes (Signed)
PT is aware of urine sample 

## 2016-01-10 NOTE — ED Notes (Signed)
Spoke with lab. Lab stated they will run mag and phos on previously collected blood.

## 2016-01-11 DIAGNOSIS — I2510 Atherosclerotic heart disease of native coronary artery without angina pectoris: Secondary | ICD-10-CM

## 2016-01-11 DIAGNOSIS — E1101 Type 2 diabetes mellitus with hyperosmolarity with coma: Secondary | ICD-10-CM

## 2016-01-11 DIAGNOSIS — Z9861 Coronary angioplasty status: Secondary | ICD-10-CM

## 2016-01-11 LAB — BASIC METABOLIC PANEL
ANION GAP: 8 (ref 5–15)
BUN: 14 mg/dL (ref 6–20)
CALCIUM: 8.3 mg/dL — AB (ref 8.9–10.3)
CHLORIDE: 111 mmol/L (ref 101–111)
CO2: 18 mmol/L — AB (ref 22–32)
CREATININE: 1.53 mg/dL — AB (ref 0.61–1.24)
GFR calc non Af Amer: 48 mL/min — ABNORMAL LOW (ref >60)
GFR, EST AFRICAN AMERICAN: 56 mL/min — AB (ref >60)
Glucose, Bld: 132 mg/dL — ABNORMAL HIGH (ref 65–99)
Potassium: 3.2 mmol/L — ABNORMAL LOW (ref 3.5–5.1)
SODIUM: 137 mmol/L (ref 135–145)

## 2016-01-11 LAB — GLUCOSE, CAPILLARY
GLUCOSE-CAPILLARY: 200 mg/dL — AB (ref 65–99)
GLUCOSE-CAPILLARY: 209 mg/dL — AB (ref 65–99)
GLUCOSE-CAPILLARY: 196 mg/dL — AB (ref 65–99)
GLUCOSE-CAPILLARY: 157 mg/dL — AB (ref 65–99)
GLUCOSE-CAPILLARY: 136 mg/dL — AB (ref 65–99)
Glucose-Capillary: 214 mg/dL — ABNORMAL HIGH (ref 65–99)
Glucose-Capillary: 168 mg/dL — ABNORMAL HIGH (ref 65–99)
Glucose-Capillary: 190 mg/dL — ABNORMAL HIGH (ref 65–99)
Glucose-Capillary: 380 mg/dL — ABNORMAL HIGH (ref 65–99)
Glucose-Capillary: 171 mg/dL — ABNORMAL HIGH (ref 65–99)

## 2016-01-11 LAB — COMPREHENSIVE METABOLIC PANEL
ALBUMIN: 3.2 g/dL — AB (ref 3.5–5.0)
ALK PHOS: 138 U/L — AB (ref 38–126)
ALT: 9 U/L — ABNORMAL LOW (ref 17–63)
ANION GAP: 7 (ref 5–15)
AST: 10 U/L — ABNORMAL LOW (ref 15–41)
BUN: 16 mg/dL (ref 6–20)
CALCIUM: 8.3 mg/dL — AB (ref 8.9–10.3)
CHLORIDE: 111 mmol/L (ref 101–111)
CO2: 18 mmol/L — AB (ref 22–32)
Creatinine, Ser: 1.51 mg/dL — ABNORMAL HIGH (ref 0.61–1.24)
GFR calc non Af Amer: 49 mL/min — ABNORMAL LOW (ref >60)
GFR, EST AFRICAN AMERICAN: 57 mL/min — AB (ref >60)
Glucose, Bld: 200 mg/dL — ABNORMAL HIGH (ref 65–99)
POTASSIUM: 3.1 mmol/L — AB (ref 3.5–5.1)
SODIUM: 136 mmol/L (ref 135–145)
Total Bilirubin: 0.5 mg/dL (ref 0.3–1.2)
Total Protein: 5.9 g/dL — ABNORMAL LOW (ref 6.5–8.1)

## 2016-01-11 LAB — CBC WITH DIFFERENTIAL/PLATELET
Basophils Absolute: 0 10*3/uL (ref 0.0–0.1)
Basophils Relative: 0 %
EOS ABS: 0.1 10*3/uL (ref 0.0–0.7)
Eosinophils Relative: 2 %
HCT: 29.8 % — ABNORMAL LOW (ref 39.0–52.0)
Hemoglobin: 11 g/dL — ABNORMAL LOW (ref 13.0–17.0)
LYMPHS ABS: 1.9 10*3/uL (ref 0.7–4.0)
LYMPHS PCT: 30 %
MCH: 32.1 pg (ref 26.0–34.0)
MCHC: 36.9 g/dL — AB (ref 30.0–36.0)
MCV: 86.9 fL (ref 78.0–100.0)
MONO ABS: 0.4 10*3/uL (ref 0.1–1.0)
MONOS PCT: 6 %
NEUTROS PCT: 62 %
Neutro Abs: 3.8 10*3/uL (ref 1.7–7.7)
PLATELETS: 108 10*3/uL — AB (ref 150–400)
RBC: 3.43 MIL/uL — ABNORMAL LOW (ref 4.22–5.81)
RDW: 13.5 % (ref 11.5–15.5)
WBC: 6.2 10*3/uL (ref 4.0–10.5)

## 2016-01-11 LAB — MRSA PCR SCREENING: MRSA by PCR: NEGATIVE

## 2016-01-11 MED ORDER — SODIUM CHLORIDE 0.9 % IV SOLN
INTRAVENOUS | Status: DC
  Administered 2016-01-11: 06:00:00 via INTRAVENOUS

## 2016-01-11 MED ORDER — STERILE WATER FOR INJECTION IJ SOLN
INTRAMUSCULAR | Status: DC
Start: 2016-01-11 — End: 2016-01-11
  Filled 2016-01-11: qty 10

## 2016-01-11 MED ORDER — INSULIN GLARGINE 100 UNIT/ML SOLOSTAR PEN: 10.0000 [IU] | PEN_INJECTOR | Freq: Every day | SUBCUTANEOUS | Status: DC

## 2016-01-11 MED ORDER — GUAIFENESIN-DM 100-10 MG/5ML PO SYRP: 5.0000 mL | ORAL_SOLUTION | ORAL | Status: DC | PRN

## 2016-01-11 MED ORDER — INSULIN STARTER KIT- SYRINGES (ENGLISH)
1.0000 | Freq: Once | Status: AC
  Administered 2016-01-11: 1
  Filled 2016-01-11: qty 1

## 2016-01-11 MED ORDER — INSULIN GLARGINE 100 UNIT/ML ~~LOC~~ SOLN: 10.0000 [IU] | Freq: Every day | SUBCUTANEOUS | Status: DC

## 2016-01-11 MED ORDER — INSULIN GLARGINE 100 UNIT/ML ~~LOC~~ SOLN
10.0000 [IU] | Freq: Every day | SUBCUTANEOUS | Status: DC
  Administered 2016-01-11: 10 [IU] via SUBCUTANEOUS
  Filled 2016-01-11 (×2): qty 0.1

## 2016-01-11 MED ORDER — POTASSIUM CHLORIDE CRYS ER 20 MEQ PO TBCR
30.0000 meq | EXTENDED_RELEASE_TABLET | Freq: Once | ORAL | Status: AC
  Administered 2016-01-11: 30 meq via ORAL
  Filled 2016-01-11: qty 1

## 2016-01-11 NOTE — Progress Notes (Signed)
Pt asked about ordering lunch before he is discharged, said "no", I have been patient enough and I am ready to go. Prescription for Insulin pen given along with discharge instructions.  Pt said his sister was picking him up at the emergency room, pt taken to ED via wheelchair. Upon entering the side door and trying to get wheelchair over threshold  pt got out of wheelchair and said get out of my way. When I tried to help him he told me to get my hands off of him.  Pt was told not to drive home.  Pt was stable but anxious to leave.

## 2016-01-11 NOTE — Discharge Summary (Signed)
Physician Discharge Summary  Chase Gibson [id number] DOB: 12/26/1955 DOA: 01/10/2016  PCP: Gara Kroner, MD  Admit date: 01/10/2016 Discharge date: 01/11/2016  Time spent: 35 minutes   Recommendations for Outpatient Follow-up:  1. Please follow-up on blood sugars, he was admitted for hyperosmolar hyper glycemic state initially presented with a blood sugar 1377 treated with IV insulin and IV fluids. He was discharged on Lantus 10 units subcutaneous daily and Januvia 50 mg by mouth daily 2. Recommend checking a BMP on hospital follow-up visit, patient presenting with acute kidney injury and electrolyte abnormalities related to hyperglycemic state. 3. I recommended he see his primary care provider within a week.   Discharge Diagnoses:  Principal Problem:   Hyperosmolar non-ketotic state in patient with type 2 diabetes mellitus ([place]) Active Problems:   GERD   LOW BACK PAIN   COPD/ pfts pending   CAD S/P LAD BMS 03/10/15   Hyperkalemia   Discharge Condition: Stable  Diet recommendation: Carb Modified  Filed Weights   01/10/16 2100  Weight: 85.2 kg (187 lb 13.3 oz)    History of present illness:  Chase Gibson is a 60 y.o. male with a past medical history of asthma, depression, anxiety, type 2 diabetes, hyperlipidemia, CAD, COPD, osteoarthritis of the knees, chronic low back pain, GERD, CKD was coming to the emergency department due to not feeling well over the past few days.  Per patient, he usually feels like this when his blood glucose are high. He states that he was seen by his primary care doctor about 2 weeks ago and was put on prednisone and antibiotics due to a respiratory infection. Since then, the patient has noticed progressively worse polyuria, polydipsia, dry lips and dry mouth. He states that he has been drinking lots of grape juice and other sugary drinks over the last 2 weeks due to his increased thirst. However, he realizes that he was making his symptoms  worse. Initial blood glucose was 1377 mg/dL.  Hospital Course:  Chase Gibson is a 60 year old gentleman with a past medical history of type 2 diabetes mellitus, coronary artery disease, admitted to the medicine service on 01/10/2016 when he presented with complaints of polyuria, polydipsia, generalized weakness. He reported recently been diagnosed with upper respiratory tract infection and had been prescribed a prednisone taper along with antimicrobial therapy. He reported drinking a fair amount of grape juice and other sugary drinks given increased thirst, realizing this worsened his symptoms. On presentation he was found to have a blood sugar 1377. He was admitted to the stepdown unit and treated with IV fluids and IV insulin. By the following morning his blood sugars came down to 137. He reported feeling significantly better and was asking to go home. His IV insulin was discontinued as he was given 10 units of Lantus and restarted on Januvia. Diet advance to carbohydrate modified diet. He was discharged home on 01/11/2016 given instructions to check his blood sugars daily, record them on a log book and present these to his primary care provider.    Discharge Exam: Filed Vitals:   01/11/16 0500 01/11/16 0600  BP: 106/57 107/58  Pulse: 75 73  Temp:    Resp: 20 19    General: No acute distress, he is awake and alert, asking to go home today. Cardiovascular: Regular rate rhythm normal S1-S2 Respiratory: Normal respiratory effort Abdomen: Soft nontender nondistended  Discharge Instructions   Discharge Instructions    Call MD for:  difficulty breathing, headache or visual disturbances  Complete by:  As directed      Call MD for:  extreme fatigue    Complete by:  As directed      Call MD for:  hives    Complete by:  As directed      Call MD for:  persistant dizziness or light-headedness    Complete by:  As directed      Call MD for:  persistant nausea and vomiting    Complete by:  As  directed      Call MD for:  redness, tenderness, or signs of infection (pain, swelling, redness, odor or green/yellow discharge around incision site)    Complete by:  As directed      Call MD for:  severe uncontrolled pain    Complete by:  As directed      Call MD for:  temperature >100.4    Complete by:  As directed      Call MD for:    Complete by:  As directed      Diet - low sodium heart healthy    Complete by:  As directed      Increase activity slowly    Complete by:  As directed           Current Discharge Medication List    START taking these medications   Details  insulin glargine (LANTUS) 100 UNIT/ML injection Inject 0.1 mLs (10 Units total) into the skin daily. Qty: 10 mL, Refills: 11      CONTINUE these medications which have NOT CHANGED   Details  albuterol (VENTOLIN HFA) 108 (90 BASE) MCG/ACT inhaler Inhale 1-2 puffs into the lungs every 4 (four) hours as needed for wheezing or shortness of breath.     aspirin 81 MG chewable tablet Chew 1 tablet (81 mg total) by mouth daily.    atorvastatin (LIPITOR) 80 MG tablet Take 1 tablet (80 mg total) by mouth daily at 6 PM. Qty: 90 tablet, Refills: 3    HYDROcodone-acetaminophen (NORCO) 10-325 MG per tablet Take 1 tablet by mouth every 6 (six) hours as needed for moderate pain.    ipratropium-albuterol (DUONEB) 0.5-2.5 (3) MG/3ML SOLN Take 3 mLs by nebulization every 6 (six) hours as needed (shortness of breath).    meclizine (ANTIVERT) 12.5 MG tablet Take 12.5 mg by mouth daily as needed for dizziness.  Refills: 0    metoprolol tartrate (LOPRESSOR) 25 MG tablet Take 0.5 tablets (12.5 mg total) by mouth 2 (two) times daily. Qty: 90 tablet, Refills: 3    montelukast (SINGULAIR) 10 MG tablet Take 10 mg by mouth once.     nicotine (NICODERM CQ - DOSED IN MG/24 HOURS) 14 mg/24hr patch Place 1 patch (14 mg total) onto the skin daily. Qty: 28 patch, Refills: 0    nitroGLYCERIN (NITROSTAT) 0.4 MG SL tablet Place 1 tablet  (0.4 mg total) under the tongue every 5 (five) minutes as needed for chest pain. Qty: 25 tablet, Refills: 2    QUEtiapine (SEROQUEL) 300 MG tablet Take 900 mg by mouth at bedtime. Refills: 1    sitaGLIPtin (JANUVIA) 50 MG tablet Take 50 mg by mouth daily.    ticagrelor (BRILINTA) 90 MG TABS tablet Take 1 tablet (90 mg total) by mouth 2 (two) times daily. Qty: 60 tablet, Refills: 11    enoxaparin (LOVENOX) 40 MG/0.4ML injection Inject 0.3 mLs (30 mg total) into the skin daily. Qty: 14 Syringe, Refills: 0    oxyCODONE (OXY IR/ROXICODONE) 5 MG immediate release tablet  Take 1-2 tablets (5-10 mg total) by mouth every 4 (four) hours as needed for severe pain or breakthrough pain. Qty: 80 tablet, Refills: 0       Allergies  Allergen Reactions  . Tramadol Itching  . Nsaids Nausea And Vomiting and Rash   Follow-up Information    Follow up with Gara Kroner, MD In 1 week.   Specialty:  Family Medicine   Contact information:   [address] [place] [address] [phone]        The results of significant diagnostics from this hospitalization (including imaging, microbiology, ancillary and laboratory) are listed below for reference.    Significant Diagnostic Studies: No results found.  Microbiology: Recent Results (from the past 240 hour(s))  MRSA PCR Screening     Status: None   Collection Time: 01/10/16  9:14 PM  Result Value Ref Range Status   MRSA by PCR NEGATIVE NEGATIVE Final    Comment:        The GeneXpert MRSA Assay (FDA approved for NASAL specimens only), is one component of a comprehensive MRSA colonization surveillance program. It is not intended to diagnose MRSA infection nor to guide or monitor treatment for MRSA infections.      Labs: Basic Metabolic Panel:  Recent Labs Lab 01/10/16 1607 01/10/16 2110 01/11/16 0331  NA 117* 135 136  K 5.7* 3.8 3.1*  CL 89* 108 111  CO2 16* 18* 18*  GLUCOSE 1377* 407* 200*  BUN 21* 18 16   CREATININE 2.33* 1.94* 1.51*  CALCIUM 9.1 8.6* 8.3*  MG 2.1 2.0  --   PHOS 4.0 3.0  --    Liver Function Tests:  Recent Labs Lab 01/11/16 0331  AST 10*  ALT 9*  ALKPHOS 138*  BILITOT 0.5  PROT 5.9*  ALBUMIN 3.2*   No results for input(s): LIPASE, AMYLASE in the last 168 hours. No results for input(s): AMMONIA in the last 168 hours. CBC:  Recent Labs Lab 01/10/16 1607 01/11/16 0331  WBC 6.3 6.2  NEUTROABS  --  3.8  HGB 13.9 11.0*  HCT 42.9 29.8*  MCV 97.7 86.9  PLT 142* 108*   Cardiac Enzymes: No results for input(s): CKTOTAL, CKMB, CKMBINDEX, TROPONINI in the last 168 hours. BNP: BNP (last 3 results)  Recent Labs  03/10/15 0322  BNP 164.2*    ProBNP (last 3 results) No results for input(s): PROBNP in the last 8760 hours.  CBG:  Recent Labs Lab 01/11/16 0256 01/11/16 0355 01/11/16 0510 01/11/16 0550 01/11/16 0720  GLUCAP 196* 168* 171* 157* 136*       Signed:  Kelvin Cellar MD.  [facility] 01/11/2016, 7:29 AM

## 2016-01-11 NOTE — Progress Notes (Signed)
Inpatient Diabetes Program Recommendations  AACE/ADA: New Consensus Statement on Inpatient Glycemic Control (2015)  Target Ranges:  Prepandial:   less than 140 mg/dL      Peak postprandial:   less than 180 mg/dL (1-2 hours)      Critically ill patients:  140 - 180 mg/dL   Results for Chase Gibson, Chase Gibson (MRN [id number]) as of 01/11/2016 11:30  Ref. Range 01/10/2016 16:07  Glucose Latest Ref Range: 65-99 mg/dL 1377 (HH)    Admit Hyperglycemia.   History: DM2, CKD, MI  Home DM Meds: Januvia 50 mg daily  Current Orders: Lantus 10 units daily     Tradjenta 5 mg daily     -Spoke with patient this AM about going home on insulin.  Patient stated he has used insulin before (Lantus insulin pen).  Stopped Lantus about a year ago b/c he lost about 50# and did not need to take the insulin anymore.  -Patient stated he does not mind taking insulin at home and would prefer to use the insulin pen.  -Verbally reviewed all steps of insulin pen injection with patient.  Also gave patient a quick demonstration of pen use and reviewed insulin pen teaching pamphlet provided in the insulin pen teaching kit.  Reminded patient to not take his Januvia when he gets home today since we have given him a dose of Tradjenta instead.  Patient stated he understood.  -Have asked MD to change patient's Rxs to Lantus insulin pen and pen needles.     --Will follow patient during hospitalization--  Wyn Quaker RN, MSN, CDE Diabetes Coordinator Inpatient Glycemic Control Team Team Pager: [phone] (8a-5p)

## 2016-02-16 ENCOUNTER — Ambulatory Visit
Admission: RE | Admit: 2016-02-16 | Discharge: 2016-02-16 | Disposition: A | Discharge status: Home / Self Care | Payer: Medicare Other | Source: Ambulatory Visit | Attending: Family Medicine | Admitting: Family Medicine

## 2016-02-16 ENCOUNTER — Other Ambulatory Visit: Payer: Self-pay | Admitting: Family Medicine

## 2016-02-16 DIAGNOSIS — J449 Chronic obstructive pulmonary disease, unspecified: Secondary | ICD-10-CM

## 2016-03-26 ENCOUNTER — Other Ambulatory Visit: Payer: Self-pay | Admitting: Cardiology

## 2016-03-27 NOTE — Telephone Encounter (Signed)
Rx(s) sent to pharmacy electronically.  

## 2016-04-10 ENCOUNTER — Telehealth: Payer: Self-pay | Admitting: Cardiology

## 2016-04-10 MED ORDER — METOPROLOL TARTRATE 25 MG PO TABS: 12.5000 mg | ORAL_TABLET | Freq: Two times a day (BID) | ORAL | Status: DC

## 2016-04-10 NOTE — Telephone Encounter (Signed)
New message   *STAT* If patient is at the pharmacy, call can be transferred to refill team.   1. Which medications need to be refilled? (please list name of each medication and dose if known) metoprolol tartrate (LOPRESSOR) 25 MG tablet  2. Which pharmacy/location (including street and city if local pharmacy) is medication to be sent to? [facility] corner of [address] and [address]   3. Do they need a 30 day or 90 day supply? [place]

## 2016-04-10 NOTE — Telephone Encounter (Signed)
Med refilled.

## 2016-04-17 ENCOUNTER — Other Ambulatory Visit: Payer: Self-pay | Admitting: Cardiology

## 2016-04-21 ENCOUNTER — Encounter: Payer: Self-pay | Admitting: Gastroenterology

## 2016-05-01 ENCOUNTER — Ambulatory Visit
Admission: RE | Admit: 2016-05-01 | Discharge: 2016-05-01 | Disposition: A | Discharge status: Home / Self Care | Payer: Medicare Other | Source: Ambulatory Visit | Attending: Family Medicine | Admitting: Family Medicine

## 2016-05-01 ENCOUNTER — Other Ambulatory Visit: Payer: Self-pay | Admitting: Family Medicine

## 2016-05-01 DIAGNOSIS — J209 Acute bronchitis, unspecified: Secondary | ICD-10-CM

## 2016-05-25 ENCOUNTER — Ambulatory Visit (INDEPENDENT_AMBULATORY_CARE_PROVIDER_SITE_OTHER): Payer: Medicare Other | Admitting: Cardiology

## 2016-05-25 ENCOUNTER — Encounter: Payer: Self-pay | Admitting: Cardiology

## 2016-05-25 VITALS — BP 110/60 | HR 64 | Ht 70.0 in | Wt 210.2 lb

## 2016-05-25 DIAGNOSIS — E785 Hyperlipidemia, unspecified: Secondary | ICD-10-CM

## 2016-05-25 DIAGNOSIS — Z794 Long term (current) use of insulin: Secondary | ICD-10-CM

## 2016-05-25 DIAGNOSIS — Z9861 Coronary angioplasty status: Secondary | ICD-10-CM

## 2016-05-25 DIAGNOSIS — E118 Type 2 diabetes mellitus with unspecified complications: Secondary | ICD-10-CM

## 2016-05-25 DIAGNOSIS — I2510 Atherosclerotic heart disease of native coronary artery without angina pectoris: Secondary | ICD-10-CM

## 2016-05-25 DIAGNOSIS — I214 Non-ST elevation (NSTEMI) myocardial infarction: Secondary | ICD-10-CM

## 2016-05-25 DIAGNOSIS — Z720 Tobacco use: Secondary | ICD-10-CM

## 2016-05-25 MED ORDER — TICAGRELOR 90 MG PO TABS: 90.0000 mg | ORAL_TABLET | Freq: Two times a day (BID) | ORAL | 2 refills | Status: DC

## 2016-05-25 MED ORDER — METOPROLOL TARTRATE 25 MG PO TABS: 12.5000 mg | ORAL_TABLET | Freq: Two times a day (BID) | ORAL | 3 refills | Status: DC

## 2016-05-25 MED ORDER — ATORVASTATIN CALCIUM 80 MG PO TABS: 80.0000 mg | ORAL_TABLET | Freq: Every day | ORAL | 3 refills | Status: DC

## 2016-05-25 NOTE — Progress Notes (Signed)
PCP: Gara Kroner, MD  Clinic Note: Chief Complaint  Patient presents with  . Annual Exam    Pt states no Sx.   . Coronary Artery Disease    Non-STEMI with 90+% LAD treated with BMS because of upcoming surgery    HPI: Chase Gibson is a 60 y.o. male with a PMH below who presents today for annual f/u of CAD (NSTEMI) - May 4655.  60 year old mildly overweight Gentleman with a history of former tobacco abuse as well as treated hypertension and hyperlipidemia.  He had a non-STEMI back in May 2016 and underwent radial cardiac cath by Dr. Burt Knack revealing a high-grade proximal LAD lesion which was stented with a bare metal stent. He had noncritical disease in his other vessels.   Chase Gibson was last seen on 05/12/2015 by Dr. Quay Burow as his only visit since his MI. This was as a preoperative assessment for redo surgery on his knee. He was doing well at that time, but was only limited because of knee discomfort.  Recent Hospitalizations: Last hospitalization was in March 2017 for profound hyperglycemia and was initiated on insulin.  Studies Reviewed: 03/10/2015   Coronary Stent Intervention  Left Heart Cath and Coronary Angiography  Conclusion   CONCLUSIONS:  SEVERE SINGLE VESSEL CAD OF THE PROXIMAL LAD  MILD-MODERATE NONOBSTRUCTIVE LCX AND RCA STENOSES  RECOMMENDATIONS:  DAPT WITH ASA AND BRILINTA AT LEAST 4 WEEKS    2-D echocardiogram . EF 65 and 70%. No RWMA. ~ normal diastolic parameters   Interval History: Chase Gibson presents today feeling quite well. He is only little bit upset that he was unable to get his medications refilled because he missed visit. He has been stable overall from a cardiac standpoint. He is back coaching basketball. He seems to have recovered from his knee surgeries and had a good bill of health from his knee surgeon. He does have some pain walking and therefore is unable to exercise much she would like to. C7 heart time keeping his  weight off. He lost a lot of weight loss surgeries, but has gained some of it back and is hoping to lose it back again. He has not had any further cardiac symptoms of anginal discomfort with rest or exertion. No heart failure symptoms of PND, orthopnea or edema.  No palpitations, lightheadedness, dizziness, weakness or syncope/near syncope. No TIA/amaurosis fugax symptoms. No melena, hematochezia, hematuria, or epstaxis. No claudication.  ROS: A comprehensive was performed. Review of Systems  Constitutional: Positive for weight loss (Lost 54 lb & gained 35 back).  HENT: Positive for congestion. Negative for nosebleeds.   Eyes: Negative for blurred vision and double vision.  Respiratory: Positive for cough (PCP treating with Amoxicillin). Negative for hemoptysis, sputum production and shortness of breath.   Cardiovascular: Negative.   Gastrointestinal: Negative for blood in stool and melena.  Genitourinary: Negative for hematuria.  Musculoskeletal: Positive for joint pain (R Knee - worse with rain).  Neurological: Negative for dizziness and headaches.  Endo/Heme/Allergies: Bruises/bleeds easily.  Psychiatric/Behavioral: Negative for depression and memory loss. The patient does not have insomnia.     Past Medical History:  Diagnosis Date  . ALLERGIC RHINITIS 03/18/2009  . ANXIETY 06/24/2007   pt denies at PAT visit on 02/25/15  . ASTHMA 06/24/2007  . BREAST PAIN, LEFT 07/21/2008  . CAD S/P percutaneous coronary angioplasty 03/11/2015   SEVERE SINGLE VESSEL CAD OF THE PROX LAD - 95%--> 0% post PCI Vision BMS 3.5 x 18 (3.75 mm); MILD-MODERATE (~  50%) NONOBSTRUCTIVE LCX AND RCA STENOSES   . CKD (chronic kidney disease), stage III   . Colon polyps 03/28/2012  . COPD (chronic obstructive pulmonary disease) ([place]) 03/28/2012  . DEPRESSION 06/24/2007  . DIABETES MELLITUS, TYPE II 07/21/2008  . GERD 08/25/2008   pt denies at PAT visit on 02/25/15  . History of non-ST elevation myocardial infarction (NSTEMI)  03/11/2015   Mild Troponin ~0.45, transient Anterior ST-T changes; LAD stenosis treated with BMS  . HYPERLIPIDEMIA 07/21/2008  . INSOMNIA-SLEEP DISORDER-UNSPEC 03/28/2010  . LOW BACK PAIN 09/03/2007  . NAUSEA 06/14/2010  . OSTEOARTHRITIS, KNEES, BILATERAL 08/25/2008  . PERIPHERAL EDEMA 07/21/2008  . SYMPTOM, ABNORMAL INVOLUNTARY MOVEMENT NEC 06/21/2007  . Tobacco abuse   . Vertigo 11/2014   pt has a bout every 3-4 months, no pattern  . Wheezing 07/13/2009    Past Surgical History:  Procedure Laterality Date  . APPENDECTOMY    . CARDIAC CATHETERIZATION N/A 03/10/2015   Procedure: Left Heart Cath and Coronary Angiography;  Surgeon: Sherren Mocha, MD;  Location: [place] [facility];  Service: Cardiovascula: EVERE SINGLE VESSEL CAD OF THE PROX LAD - 95%--> BMS PCI. MILD-MODERATE (~50%) NONOBSTRUCTIVE LCX AND RCA STENOSES   . CARDIAC CATHETERIZATION N/A 03/10/2015   Procedure: Coronary Stent Intervention;  Surgeon: Sherren Mocha, MD;  Location: [facility]: PROX LAD - 95%--> 0% post PCI Vision BMS 3.5 x 18 (3.75 mm) - BMS used b/c upcoming knee Sgx.  . CHOLECYSTECTOMY    . inguinal herniorrhapy Bilateral    1979 & 1997  . KNEE ARTHROSCOPY Left    left  . Removal of right total knee implants for periprosthetic infection  10/02/2014  . Right knee arthrotomy, debridement, and reinsertion of antibiotic cement spacer  12/18/2014  . right knee surgury     x 9  . ROTATOR CUFF REPAIR Bilateral    bilateral  . TONSILLECTOMY    . TOTAL KNEE ARTHROPLASTY Right    right  . TOTAL KNEE ARTHROPLASTY Right 05/17/2015   Procedure: TOTAL KNEE ARTHROPLASTY, REMOVAL OF SPACER;  Surgeon: Dereck Leep, MD;  Location: [facility];  Service: Orthopedics;  Laterality: Right;  . TRANSTHORACIC ECHOCARDIOGRAM  03/10/2015   EF 65 and 70%. No RWMA. ~ normal diastolic parameters    Prior to Admission medications   Medication Sig Start Date End Date Taking? Authorizing Provider  albuterol (VENTOLIN HFA) 108 (90  BASE) MCG/ACT inhaler Inhale 1-2 puffs into the lungs every 4 (four) hours as needed for wheezing or shortness of breath.    Yes Historical Provider, MD  aspirin 81 MG chewable tablet Chew 1 tablet (81 mg total) by mouth daily. 03/11/15  Yes Erlene Quan, PA-C  atorvastatin (LIPITOR) 80 MG tablet Take 1 tablet (80 mg total) by mouth daily at 6 PM. 03/11/15  Yes Erlene Quan, PA-C  BRILINTA 90 MG TABS tablet TAKE 1 TABLET BY MOUTH TWICE DAILY 04/18/16  Yes Leonie Man, MD  HYDROcodone-acetaminophen [facility]) 10-325 MG per tablet Take 1 tablet by mouth every 6 (six) hours as needed for moderate pain. 03/28/12  Yes Biagio Borg, MD  Insulin Glargine (LANTUS) 100 UNIT/ML Solostar Pen Inject 10 Units into the skin daily. Patient taking differently: Inject 55 Units into the skin daily.  01/11/16  Yes Kelvin Cellar, MD  ipratropium-albuterol (DUONEB) 0.5-2.5 (3) MG/3ML SOLN Take 3 mLs by nebulization every 6 (six) hours as needed (shortness of breath).   Yes Historical Provider, MD  meclizine (ANTIVERT) 12.5 MG tablet  Take 12.5 mg by mouth daily as needed for dizziness.  11/09/15  Yes Historical Provider, MD  metoprolol tartrate (LOPRESSOR) 25 MG tablet Take 0.5 tablets (12.5 mg total) by mouth 2 (two) times daily. PLEASE KEEP APPOINTMENT. 04/10/16  Yes Leonie Man, MD  montelukast (SINGULAIR) 10 MG tablet Take 10 mg by mouth once.  09/28/11  Yes Biagio Borg, MD  nitroGLYCERIN (NITROSTAT) 0.4 MG SL tablet Place 1 tablet (0.4 mg total) under the tongue every 5 (five) minutes as needed for chest pain. 03/12/15  Yes Luke K Kilroy, PA-C  QUEtiapine (SEROQUEL) 300 MG tablet Take 900 mg by mouth at bedtime. 11/19/15  Yes Historical Provider, MD  sitaGLIPtin (JANUVIA) 50 MG tablet Take 50 mg by mouth daily.   Yes Historical Provider, MD   Allergies  Allergen Reactions  . Tramadol Itching  . Nsaids Nausea And Vomiting and Rash    Social History   Social History  . Marital status: Divorced    Spouse name:  N/A  . Number of children: 2  . Years of education: N/A   Social History Main Topics  . Smoking status: Former Smoker    Packs/day: 1.00    Years: 35.00    Types: Cigarettes    Quit date: 12/02/2012  . Smokeless tobacco: Never Used  . Alcohol use No  . Drug use: No  . Sexual activity: Not Asked   Other Topics Concern  . None   Social History Narrative   Works as a Biochemist, clinical. Was actually not working at the time of his MI because of pain from his knee. He was scheduled to have right knee total replacement the week after his MI.   With long-term smoker, who quit following his MI.   He has a huge Public librarian with tattoos    Family History  Problem Relation Age of Onset  . Coronary artery disease Other   . Emphysema Mother     never smoker but spouse smoked  . Allergies Son   . Asthma Maternal Grandmother   . Rheum arthritis Sister      Wt Readings from Last 3 Encounters:  05/25/16 210 lb 3.2 oz (95.3 kg)  01/10/16 187 lb 13.3 oz (85.2 kg)  05/17/15 184 lb (83.5 kg)    PHYSICAL EXAM BP 110/60   Pulse 64   Ht 5\' 10"  (1.778 m)   Wt 210 lb 3.2 oz (95.3 kg)   BMI 30.16 kg/m  General appearance: alert, cooperative, appears stated age, no distress and Borderline truncal obesity HEENT: [place]/AT, EOMI, MMM, anicteric sclera Neck: no adenopathy, no carotid bruit and no JVD Lungs: clear to auscultation bilaterally, normal percussion bilaterally and non-labored Heart: regular rate and rhythm, S1, S2 normal, no murmur, click, rub or gallop; nondisplaced PMI Abdomen: soft, non-tender; bowel sounds normal; no masses,  no organomegaly; no HJR Extremities: extremities normal, atraumatic, no cyanosis, and minimal edema; Extensive surgical scars in the right knee, but no significant erythema or or swelling. Mild atrophy of the left leg muscles. Pulses: 2+ and symmetric; Skin: mobility and turgor normal, no evidence of bleeding or bruising and no lesions noted or    Neurologic: Mental status: Alert, oriented, thought content appropriate Cranial nerves: normal (II-XII grossly intact)    Adult ECG Report  Rate: 64 ;  Rhythm: normal sinus rhythm and Normal axis, intervals and durations.;   Narrative Interpretation: Stable EKG from last year.   Other studies Reviewed: Additional studies/ records that were  reviewed today include:  Recent Labs:  Now followed by PCP (due in November) -current labs not available. Lab Results  Component Value Date   CHOL 145 03/11/2015   HDL 28 (L) 03/11/2015   LDLCALC 90 03/11/2015   LDLDIRECT 64.7 09/23/2010   TRIG 136 03/11/2015   CHOLHDL 5.2 03/11/2015     ASSESSMENT / PLAN: Problem List Items Addressed This Visit    Type II diabetes mellitus with complication (HCC) (Chronic)    Followed by PCP. Now on long-lasting insulin along with Januvia.      Relevant Medications   atorvastatin (LIPITOR) 80 MG tablet   Tobacco abuse    He seems to doing pretty well with smoking cessation. I congratulated him on staying abstinent.      Non-ST elevation myocardial infarction (NSTEMI), subsequent episode of care Chase Gibson) (Chronic)    Minimal troponin elevation despite having significant approximately lesion. No regional wall motion modalities to suggest any significant damage. No further angina or heart failure symptoms. He is now back as active as he would like to be, of course limited by his knee pain.      Relevant Medications   metoprolol tartrate (LOPRESSOR) 25 MG tablet   atorvastatin (LIPITOR) 80 MG tablet   Hyperlipidemia with target LDL less than 70 (Chronic)    I haven't seen any labs since last year, but it looked like his Labs are pretty well controlled prior to his MI. He is now on high-dose statin. I don't see labs from last year, but he is due for follow-up labs this fall.      Relevant Medications   metoprolol tartrate (LOPRESSOR) 25 MG tablet   atorvastatin (LIPITOR) 80 MG tablet   CAD S/P LAD BMS  03/10/15    Doing well. No signs of the restenosis. Remains on Brilinta - he continued due to the ACS presentation. I think we can stop aspirin. Data would suggest the still benefit from continuing Brilinta beyond 1 year. It would be a fine for him to stop it for any potential procedures. Also for any significant bruise or bleeding. He is on high-dose atorvastatin monitored by PCP, very low dose metoprolol which will be refilled. Otherwise blood pressure and heart rate would not tolerate any further titration.      Relevant Medications   metoprolol tartrate (LOPRESSOR) 25 MG tablet   atorvastatin (LIPITOR) 80 MG tablet    Other Visit Diagnoses    Coronary artery disease involving native coronary artery of native heart without angina pectoris    -  Primary   Relevant Medications   metoprolol tartrate (LOPRESSOR) 25 MG tablet   atorvastatin (LIPITOR) 80 MG tablet   Other Relevant Orders   EKG 12-Lead (Completed)      Current medicines are reviewed at length with the patient today. (+/- concerns) : He ran out of his medications because his one-year point was delayed.  The following changes have been made: Please restart scheduled medications. Refills given  Studies Ordered:   Orders Placed This Encounter  Procedures  . EKG 12-Lead   Follow-up in one year with Dr. Ellyn Hack. Continue to follow-up lipids with PCP   Chase Gibson, M.D., M.S. Interventional Cardiologist   Pager # [phone] Phone # [phone] [address]. [place], [place] [address]

## 2016-05-25 NOTE — Patient Instructions (Signed)
Medication Instructions:   REFILLS HAVE BEEN SENT TO THE PHARMACY  Follow-Up:  Your physician wants you to follow-up in: [place] will receive a reminder letter in the mail two months in advance. If you don't receive a letter, please call our office to schedule the follow-up appointment.   If you need a refill on your cardiac medications before your next appointment, please call your pharmacy.

## 2016-05-26 ENCOUNTER — Other Ambulatory Visit: Payer: Self-pay

## 2016-05-26 ENCOUNTER — Encounter: Payer: Self-pay | Admitting: Cardiology

## 2016-05-26 MED ORDER — TICAGRELOR 90 MG PO TABS: 90.0000 mg | ORAL_TABLET | Freq: Two times a day (BID) | ORAL | 3 refills | Status: DC

## 2016-05-26 NOTE — Telephone Encounter (Signed)
Rx(s) sent to pharmacy electronically.  

## 2016-05-27 ENCOUNTER — Encounter: Payer: Self-pay | Admitting: Cardiology

## 2016-05-27 NOTE — Assessment & Plan Note (Signed)
Minimal troponin elevation despite having significant approximately lesion. No regional wall motion modalities to suggest any significant damage. No further angina or heart failure symptoms. He is now back as active as he would like to be, of course limited by his knee pain.

## 2016-05-27 NOTE — Assessment & Plan Note (Signed)
He seems to doing pretty well with smoking cessation. I congratulated him on staying abstinent.

## 2016-05-27 NOTE — Assessment & Plan Note (Signed)
Followed by PCP. Now on long-lasting insulin along with Januvia.

## 2016-05-27 NOTE — Assessment & Plan Note (Signed)
I haven't seen any labs since last year, but it looked like his Labs are pretty well controlled prior to his MI. He is now on high-dose statin. I don't see labs from last year, but he is due for follow-up labs this fall.

## 2016-05-27 NOTE — Assessment & Plan Note (Signed)
Doing well. No signs of the restenosis. Remains on Brilinta - he continued due to the ACS presentation. I think we can stop aspirin. Data would suggest the still benefit from continuing Brilinta beyond 1 year. It would be a fine for him to stop it for any potential procedures. Also for any significant bruise or bleeding. He is on high-dose atorvastatin monitored by PCP, very low dose metoprolol which will be refilled. Otherwise blood pressure and heart rate would not tolerate any further titration.

## 2016-07-06 ENCOUNTER — Encounter: Payer: Self-pay | Admitting: Gastroenterology

## 2016-07-12 IMAGING — CR DG CHEST 1V PORT
1 series · 1 of 1 positions shown · non-contrast
Comparison: 05/28/2014

CLINICAL DATA: Status post left PICC line placement

EXAM:
PORTABLE CHEST - 1 VIEW

[ap]
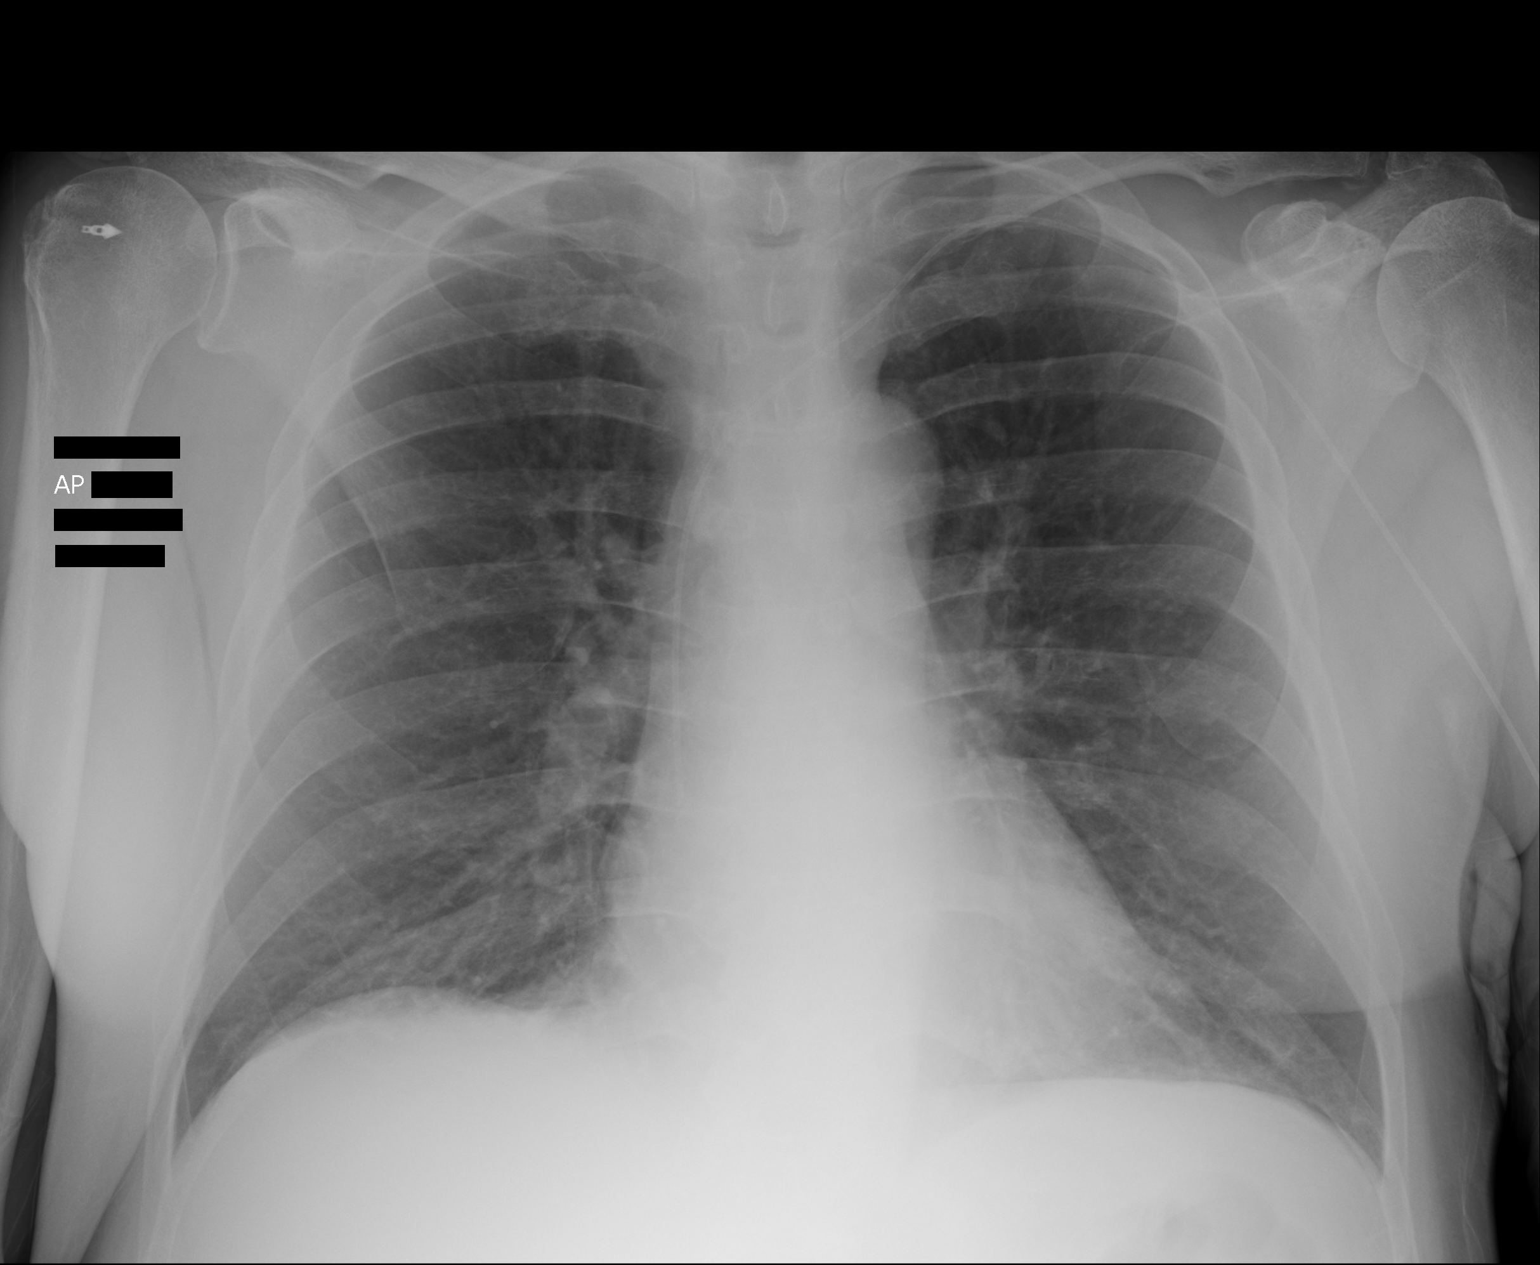

[1 of 1 positions shown; findings below may reference images not displayed]

FINDINGS: A left-sided PICC line has been placed with the tip at the
cavoatrial junction. The cardiac shadow is within normal limits. The
lungs are well aerated bilaterally.
IMPRESSION: PICC line in satisfactory position.

## 2016-07-24 ENCOUNTER — Ambulatory Visit: Payer: Medicare Other | Admitting: Gastroenterology

## 2016-08-28 ENCOUNTER — Other Ambulatory Visit: Payer: Self-pay | Admitting: Family Medicine

## 2016-08-28 ENCOUNTER — Ambulatory Visit
Admission: RE | Admit: 2016-08-28 | Discharge: 2016-08-28 | Disposition: A | Discharge status: Home / Self Care | Payer: Medicare Other | Source: Ambulatory Visit | Attending: Family Medicine | Admitting: Family Medicine

## 2016-08-28 DIAGNOSIS — J4550 Severe persistent asthma, uncomplicated: Secondary | ICD-10-CM

## 2017-01-15 ENCOUNTER — Encounter: Payer: Self-pay | Admitting: Neurology

## 2017-02-06 NOTE — Progress Notes (Addendum)
Chase Gibson was seen today in the movement disorders clinic for neurologic consultation at the request of Gara Kroner, MD.  The consultation is for the evaluation of tremor.  The records that were made available to me were reviewed.   Tremor: Yes.     How long has it been going on? 10+ years but worse over the last 6 weeks.  Denies med changes in 6-8 weeks.    At rest or with activation?  activation  When is it noted the most?  Drinking, eating  Fam hx of tremor?  No.  Located where?  L more than R hand (he is ambidexterous but writes with the R hand)  Affected by caffeine:  unknown (drinks 3 cups coffee in AM)  Affected by alcohol:  Doesn't drink alcohol  Affected by stress:  No.  Affected by fatigue:  No.  Spills soup if on spoon:  May or may not  Spills glass of liquid if full:  May or may not  Affects ADL's (tying shoes, brushing teeth, etc):  No.  (and shaves with a blade)  Pt on seroquel XR 500 (takes a 300 plus a 200) mg and that is RX by psychiatry, Dr. Toniann Fail at [facility] in [place]  Also notes "my eyes have been blinking more" x 1 month.  Thought that it was due to wearing contacts more.     Other specific sx's: Voice: no changes Sleep: states that uses seroquel to sleep and not for mood  Vivid Dreams:  No.  Acting out dreams:  No. Wet Pillows: No. Postural symptoms:  No.  Falls?  No. Bradykinesia symptoms: no bradykinesia noted Loss of smell:  No. Loss of taste:  No. Urinary Incontinence:  No. Difficulty Swallowing:  No. Handwriting, micrographia: No. Depression:  No. (well controlled with VPA x 20 years) Memory changes:  No. Hallucinations:  No.  visual distortions: No. N/V:  No. Lightheaded:  Yes.   (takes meclizine for that)  Syncope: No. Diplopia:  No. Dyskinesia:  No.  Neuroimaging has not previously been performed.  Meds that may cause tremor: VPA, albuterol (uses several times a day),duoneb (uses daily), singulair,  seroquel,  ALLERGIES:   Allergies  Allergen Reactions  . Tramadol Itching  . Nsaids Nausea And Vomiting and Rash    CURRENT MEDICATIONS:  Outpatient Encounter Prescriptions as of 02/08/2017  Medication Sig  . albuterol (VENTOLIN HFA) 108 (90 BASE) MCG/ACT inhaler Inhale 1-2 puffs into the lungs every 4 (four) hours as needed for wheezing or shortness of breath.   Marland Kitchen atorvastatin (LIPITOR) 80 MG tablet Take 1 tablet (80 mg total) by mouth daily at 6 PM.  . divalproex (DEPAKOTE ER) 500 MG 24 hr tablet Take by mouth daily.  Marland Kitchen HYDROcodone-acetaminophen (NORCO) 10-325 MG per tablet Take 1 tablet by mouth every 6 (six) hours as needed for moderate pain.  . Insulin Glargine (LANTUS) 100 UNIT/ML Solostar Pen Inject 10 Units into the skin daily. (Patient taking differently: Inject 55 Units into the skin daily. )  . ipratropium-albuterol (DUONEB) 0.5-2.5 (3) MG/3ML SOLN Take 3 mLs by nebulization every 6 (six) hours as needed (shortness of breath).  . meclizine (ANTIVERT) 12.5 MG tablet Take 12.5 mg by mouth daily as needed for dizziness.   . metoprolol tartrate (LOPRESSOR) 25 MG tablet Take 0.5 tablets (12.5 mg total) by mouth 2 (two) times daily.  . montelukast (SINGULAIR) 10 MG tablet Take 10 mg by mouth once.   Marland Kitchen QUEtiapine (SEROQUEL) 300 MG tablet  Take 500 mg by mouth at bedtime.   . sitaGLIPtin (JANUVIA) 50 MG tablet Take 50 mg by mouth daily.  . ticagrelor (BRILINTA) 90 MG TABS tablet Take 1 tablet (90 mg total) by mouth 2 (two) times daily.  . nitroGLYCERIN (NITROSTAT) 0.4 MG SL tablet Place 1 tablet (0.4 mg total) under the tongue every 5 (five) minutes as needed for chest pain. (Patient not taking: Reported on 02/08/2017)  . [DISCONTINUED] aspirin 81 MG chewable tablet Chew 1 tablet (81 mg total) by mouth daily.   No facility-administered encounter medications on file as of 02/08/2017.     PAST MEDICAL HISTORY:   Past Medical History:  Diagnosis Date  . ALLERGIC RHINITIS 03/18/2009  .  ANXIETY 06/24/2007   pt denies at PAT visit on 02/25/15  . ASTHMA 06/24/2007  . BREAST PAIN, LEFT 07/21/2008  . CAD S/P percutaneous coronary angioplasty 03/11/2015   SEVERE SINGLE VESSEL CAD OF THE PROX LAD - 95%--> 0% post PCI Vision BMS 3.5 x 18 (3.75 mm); MILD-MODERATE (~50%) NONOBSTRUCTIVE LCX AND RCA STENOSES   . CKD (chronic kidney disease), stage III   . Colon polyps 03/28/2012  . COPD (chronic obstructive pulmonary disease) ([place]) 03/28/2012  . DEPRESSION 06/24/2007  . DIABETES MELLITUS, TYPE II 07/21/2008  . GERD 08/25/2008   pt denies at PAT visit on 02/25/15  . History of non-ST elevation myocardial infarction (NSTEMI) 03/11/2015   Mild Troponin ~0.45, transient Anterior ST-T changes; LAD stenosis treated with BMS  . HYPERLIPIDEMIA 07/21/2008  . INSOMNIA-SLEEP DISORDER-UNSPEC 03/28/2010  . LOW BACK PAIN 09/03/2007  . NAUSEA 06/14/2010  . OSTEOARTHRITIS, KNEES, BILATERAL 08/25/2008  . PERIPHERAL EDEMA 07/21/2008  . SYMPTOM, ABNORMAL INVOLUNTARY MOVEMENT NEC 06/21/2007  . Tobacco abuse   . Vertigo 11/2014   pt has a bout every 3-4 months, no pattern  . Wheezing 07/13/2009    PAST SURGICAL HISTORY:   Past Surgical History:  Procedure Laterality Date  . APPENDECTOMY    . CARDIAC CATHETERIZATION N/A 03/10/2015   Procedure: Left Heart Cath and Coronary Angiography;  Surgeon: Sherren Mocha, MD;  Location: [facility];  Service: Cardiovascula: EVERE SINGLE VESSEL CAD OF THE PROX LAD - 95%--> BMS PCI. MILD-MODERATE (~50%) NONOBSTRUCTIVE LCX AND RCA STENOSES   . CARDIAC CATHETERIZATION N/A 03/10/2015   Procedure: Coronary Stent Intervention;  Surgeon: Sherren Mocha, MD;  Location: [facility]: PROX LAD - 95%--> 0% post PCI Vision BMS 3.5 x 18 (3.75 mm) - BMS used b/c upcoming knee Sgx.  . CHOLECYSTECTOMY    . inguinal herniorrhapy Bilateral    1979 & 1997  . KNEE ARTHROSCOPY Left    left  . Removal of right total knee implants for periprosthetic infection  10/02/2014  . Right knee  arthrotomy, debridement, and reinsertion of antibiotic cement spacer  12/18/2014  . right knee surgury     x 9  . ROTATOR CUFF REPAIR Bilateral    bilateral  . TONSILLECTOMY    . TOTAL KNEE ARTHROPLASTY Right    right  . TOTAL KNEE ARTHROPLASTY Right 05/17/2015   Procedure: TOTAL KNEE ARTHROPLASTY, REMOVAL OF SPACER;  Surgeon: Dereck Leep, MD;  Location: [facility];  Service: Orthopedics;  Laterality: Right;  . TRANSTHORACIC ECHOCARDIOGRAM  03/10/2015   EF 65 and 70%. No RWMA. ~ normal diastolic parameters    SOCIAL HISTORY:   Social History   Social History  . Marital status: Divorced    Spouse name: N/A  . Number of children: 2  .  Years of education: N/A   Occupational History  . Not on file.   Social History Main Topics  . Smoking status: Current Every Day Smoker    Packs/day: 1.00    Years: 35.00    Types: Cigarettes    Last attempt to quit: 12/02/2012  . Smokeless tobacco: Former Systems developer  . Alcohol use No  . Drug use: No  . Sexual activity: Not on file   Other Topics Concern  . Not on file   Social History Narrative   Works as a Biochemist, clinical. Was actually not working at the time of his MI because of pain from his knee. He was scheduled to have right knee total replacement the week after his MI.   With long-term smoker, who quit following his MI.   He has a huge Public librarian with tattoos    FAMILY HISTORY:   Family Status  Relation Status  . Other   . Mother Alive  . Son Alive  . Maternal Grandmother   . Sister Alive  . Father Deceased  . Daughter Alive    ROS:  A complete 10 system review of systems was obtained and was unremarkable apart from what is mentioned above.  PHYSICAL EXAMINATION:    VITALS:   Vitals:   02/08/17 0919  BP: 120/68  Pulse: 67  SpO2: 95%  Weight: 195 lb (88.5 kg)  Height: 5\' 10"  (1.778 m)    GEN:  The patient appears stated age and is in NAD. HEENT:  Normocephalic, atraumatic.  The mucous membranes are moist. The  superficial temporal arteries are without ropiness or tenderness. CV:  RRR Lungs:  CTAB Neck/HEME:  There are no carotid bruits bilaterally.  Neurological examination:  Orientation: The patient is alert and oriented x3. Fund of knowledge is appropriate.  Recent and remote memory are intact.  Attention and concentration are normal.    Able to name objects and repeat phrases. Cranial nerves: There is good facial symmetry. There is blepharospasm.  Pupils are equal round and reactive to light bilaterally. Fundoscopic exam reveals clear margins bilaterally. Extraocular muscles are intact. The visual fields are full to confrontational testing. The speech is fluent and clear. Soft palate rises symmetrically and there is no tongue deviation. Hearing is intact to conversational tone. Sensation: Sensation is intact to light and pinprick throughout (facial, trunk, extremities). Vibration is intact at the bilateral big toe. There is no extinction with double simultaneous stimulation. There is no sensory dermatomal level identified. Motor: Strength is 5/5 in the bilateral upper and lower extremities.   Shoulder shrug is equal and symmetric.  There is no pronator drift. Deep tendon reflexes: Deep tendon reflexes are 2/4 at the bilateral biceps, triceps, brachioradialis, left patella, absent at the right patella and 1/4 at the bilateral achilles. Plantar responses are downgoing bilaterally.  Movement examination: Tone: There is normal tone in the bilateral upper extremities.  The tone in the lower extremities is normal.  Abnormal movements: There is tremor of outstretched hands that increases only slightly with intention.  The L is worse than the right.   Coordination:  There is no decremation with RAM's, with any form of RAMS, including alternating supination and pronation of the forearm, hand opening and closing, finger taps, heel taps and toe taps. Gait and Station: The patient has no difficulty arising out of  a deep-seated chair without the use of the hands.  He is able to ambulate in a tandem fashion.    Addendum (  labs)  Received lab work from patient's prior care physician dated 08/28/2016.  White blood cells were 5.3, hemoglobin 12.4, hematocrit 34.3 and platelets 183.  Sodium was 139, potassium 3.6, chloride 107, CO2 26, BUN 22, creatinine was elevated at 1.93.  AST was 15 ALT was 8 and alkaline phosphatase 85.  His hemoglobin A1c was 5.4.  His TSH was normal at 0.79.   ASSESSMENT/PLAN:  1.  Tremor  -He is on multiple meds that could cause tremor including VPA, albuterol,duoneb, singulair,seroquel.  He reports that he has been on VPA for 20 years and seroquel for 10-15 years.  However, he does not look parkinsonian and generally if it is from Seroquel, it produces a parkinsonian tremor.  Regardless, the patient states that he is not able to get off of Depakote and has tried several times.  His Seroquel was just recently reduced and he does not know if it will be able to be reduced further.  He wants to try medication for the tremor because it has become very bothersome.  We will start primidone, although I did discuss with him that we need to get him used to the medication first and that low dose may not help.  It is primarily the left hand that is the most bothersome.  We discussed risks, benefits, and side effects.  He expressed understanding.  -tried cogentin without relief and caused dry mouth   -He is not a candidate for beta blocker because of COPD and depression.  2.  Blepharospasm  -This may be tardive in nature.  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  He states that his psychiatrist was trying to reduce Seroquel because of this.  He and I discussed the use of Botox.  He has not interested right now.  I am going to go ahead and do an MRI of the brain just to make sure we are not missing anything, given that he has noticed an increase in tremor and blepharospasm, which both started around the  same time, without changes in medication (besides for attempts at decrease).  3.  Follow up is anticipated in the next few months, sooner should new neurologic issues arise.  In the meantime, I will try to get labs from PCP.  Much greater than 50% of this visit was spent in counseling and coordinating care.  Total face to face time:  60 min   Cc:  Gara Kroner, MD

## 2017-02-08 ENCOUNTER — Ambulatory Visit (INDEPENDENT_AMBULATORY_CARE_PROVIDER_SITE_OTHER): Payer: Medicare Other | Admitting: Neurology

## 2017-02-08 ENCOUNTER — Encounter: Payer: Self-pay | Admitting: Neurology

## 2017-02-08 VITALS — BP 120/68 | HR 67 | Ht 70.0 in | Wt 195.0 lb

## 2017-02-08 DIAGNOSIS — G245 Blepharospasm: Secondary | ICD-10-CM

## 2017-02-08 DIAGNOSIS — G251 Drug-induced tremor: Secondary | ICD-10-CM

## 2017-02-08 MED ORDER — PRIMIDONE 50 MG PO TABS: 50.0000 mg | ORAL_TABLET | Freq: Every day | ORAL | 3 refills | Status: DC

## 2017-02-08 MED ORDER — PRIMIDONE 50 MG PO TABS: 50.0000 mg | ORAL_TABLET | Freq: Every day | ORAL | 1 refills | Status: DC

## 2017-02-08 NOTE — Patient Instructions (Signed)
1. Start Primidone 50 mg tablets. Take 1/2 tablet for 4 nights, then increase to 1 tablet. Prescription has been sent to your pharmacy. The first dose of medication can cause some dizziness/nausea that should go away after the first dose.   2. We have sent a referral to [facility] for your MRI and they will call you directly to schedule your appt. They are located at [address] If you need to contact them directly please call [phone].    

## 2017-02-26 ENCOUNTER — Telehealth: Payer: Self-pay | Admitting: Neurology

## 2017-02-26 MED ORDER — PRIMIDONE 50 MG PO TABS
50.0000 mg | ORAL_TABLET | Freq: Two times a day (BID) | ORAL | 0 refills | Status: DC
Start: 2017-02-26 — End: 2017-09-25

## 2017-02-26 NOTE — Telephone Encounter (Signed)
RX sent to pharmacy. Patient made aware.

## 2017-02-26 NOTE — Telephone Encounter (Signed)
Spoke with patient and he states that Primidone is working to help tremors some. He can carry a tray now without spilling food. He is still having bothersome tremor and wondering if he can increase from Primidone 50 mg once daily. No side effects. Please advise.

## 2017-02-26 NOTE — Telephone Encounter (Signed)
Received a call from PT regarding medication: Primidone.  Patient needs a refill of medication. No  Patient having side effects from medication. No  Patient calling to update [place] on medication. PT called and said he feels he needs a dosage increase

## 2017-02-26 NOTE — Telephone Encounter (Signed)
Okay to go to 50 mg bid

## 2017-02-27 ENCOUNTER — Inpatient Hospital Stay: Admission: RE | Admit: 2017-02-27 | Payer: Medicare Other | Source: Ambulatory Visit

## 2017-03-05 ENCOUNTER — Telehealth: Payer: Self-pay | Admitting: Neurology

## 2017-03-05 ENCOUNTER — Encounter: Payer: Self-pay | Admitting: Gastroenterology

## 2017-03-05 NOTE — Telephone Encounter (Signed)
Spoke with patient. He asks if he can change to a different medication for tremor. He has had a significant increase in sex drive that was present at one tablet of Primidone but got worse with BID dosing. He does not want to go back to one tablet a day. Please advise.  He requests 90 day supply if we change medication.

## 2017-03-05 NOTE — Telephone Encounter (Signed)
This medication is not associated with increased sex drive.  The only "sexual" side effect that is reported in literature is impotency and that is obviously not the problem here.  Any other new meds?  Don't think that changing meds will change this issue

## 2017-03-05 NOTE — Telephone Encounter (Signed)
Caller: PT  Urgent? NO  Reason for the call: PT left a VM message saying he needed to talk to Dr Doristine Devoid nurse regarding medication but did not say what the name of the medication is

## 2017-03-05 NOTE — Telephone Encounter (Signed)
Patient made aware. He is going to stay off Primidone for a couple days and see if symptoms change. No new meds other than Primidone. He will call and let us know.

## 2017-03-15 ENCOUNTER — Telehealth: Payer: Self-pay | Admitting: Neurology

## 2017-03-15 NOTE — Telephone Encounter (Signed)
Called patient and left message for him to call me back.  

## 2017-03-15 NOTE — Telephone Encounter (Signed)
Please call.

## 2017-03-15 NOTE — Telephone Encounter (Signed)
Patient states that she needs to talk to someone about  His medication he states you know the problem he is having

## 2017-03-15 NOTE — Telephone Encounter (Signed)
I spoke with patient and he said that he stopped the primidone and his side effect went away.  He had to start back due to right hand tremor and the side effect came back.  Please advise.

## 2017-03-15 NOTE — Telephone Encounter (Signed)
Unfortunately, I have no other options.  He can d/c the medication but I don't have other options and haven't heard of that as a side effect.

## 2017-03-16 NOTE — Telephone Encounter (Signed)
Patient given message.

## 2017-04-11 ENCOUNTER — Ambulatory Visit: Payer: Medicare Other | Admitting: Physician Assistant

## 2017-04-18 ENCOUNTER — Encounter: Payer: Self-pay | Admitting: Gastroenterology

## 2017-04-18 ENCOUNTER — Telehealth: Payer: Self-pay | Admitting: *Deleted

## 2017-04-18 ENCOUNTER — Ambulatory Visit (INDEPENDENT_AMBULATORY_CARE_PROVIDER_SITE_OTHER): Payer: Medicare Other | Admitting: Gastroenterology

## 2017-04-18 VITALS — BP 90/56 | HR 80 | Ht 70.0 in | Wt 209.6 lb

## 2017-04-18 DIAGNOSIS — Z794 Long term (current) use of insulin: Secondary | ICD-10-CM | POA: Diagnosis not present

## 2017-04-18 DIAGNOSIS — Z86010 Personal history of colonic polyps: Secondary | ICD-10-CM | POA: Insufficient documentation

## 2017-04-18 DIAGNOSIS — IMO0001 Reserved for inherently not codable concepts without codable children: Secondary | ICD-10-CM

## 2017-04-18 DIAGNOSIS — Z7902 Long term (current) use of antithrombotics/antiplatelets: Secondary | ICD-10-CM | POA: Diagnosis not present

## 2017-04-18 DIAGNOSIS — E119 Type 2 diabetes mellitus without complications: Secondary | ICD-10-CM

## 2017-04-18 MED ORDER — NA SULFATE-K SULFATE-MG SULF 17.5-3.13-1.6 GM/177ML PO SOLN: 1.0000 | Freq: Once | ORAL | 0 refills | Status: AC

## 2017-04-18 NOTE — Progress Notes (Signed)
04/18/2017 ADISA LITT [id number] 1956/01/10   HISTORY OF PRESENT ILLNESS:  This is a 61 year old male who is previously known to Dr. Deatra Ina for colonoscopy in 05/2011 at which time he had 2 polyps removed, one tubular adenoma and 1 hyperplastic polyp.  He is actually already scheduled for a surveillance colonoscopy with Dr. Loletha Carrow who will be assuming his care. It was discovered that he was on Brilinta, however, and he was scheduled for this office visit. He says that he has been on the Brilinta for couple of years after he had cardiac stents placed. His cardiologist is Dr. Ellyn Hack.  Denies any GI complaints.   Past Medical History:  Diagnosis Date  . ALLERGIC RHINITIS 03/18/2009  . ANXIETY 06/24/2007   pt denies at PAT visit on 02/25/15  . ASTHMA 06/24/2007  . BREAST PAIN, LEFT 07/21/2008  . CAD S/P percutaneous coronary angioplasty 03/11/2015   SEVERE SINGLE VESSEL CAD OF THE PROX LAD - 95%--> 0% post PCI Vision BMS 3.5 x 18 (3.75 mm); MILD-MODERATE (~50%) NONOBSTRUCTIVE LCX AND RCA STENOSES   . CKD (chronic kidney disease), stage III   . Colon polyps 03/28/2012  . COPD (chronic obstructive pulmonary disease) ([place]) 03/28/2012  . DEPRESSION 06/24/2007  . DIABETES MELLITUS, TYPE II 07/21/2008  . GERD 08/25/2008   pt denies at PAT visit on 02/25/15  . History of non-ST elevation myocardial infarction (NSTEMI) 03/11/2015   Mild Troponin ~0.45, transient Anterior ST-T changes; LAD stenosis treated with BMS  . HYPERLIPIDEMIA 07/21/2008  . INSOMNIA-SLEEP DISORDER-UNSPEC 03/28/2010  . LOW BACK PAIN 09/03/2007  . NAUSEA 06/14/2010  . OSTEOARTHRITIS, KNEES, BILATERAL 08/25/2008  . PERIPHERAL EDEMA 07/21/2008  . SYMPTOM, ABNORMAL INVOLUNTARY MOVEMENT NEC 06/21/2007  . Tobacco abuse   . Vertigo 11/2014   pt has a bout every 3-4 months, no pattern  . Wheezing 07/13/2009   Past Surgical History:  Procedure Laterality Date  . APPENDECTOMY    . CARDIAC CATHETERIZATION N/A 03/10/2015   Procedure: Left Heart  Cath and Coronary Angiography;  Surgeon: Sherren Mocha, MD;  Location: [facility];  Service: Cardiovascula: EVERE SINGLE VESSEL CAD OF THE PROX LAD - 95%--> BMS PCI. MILD-MODERATE (~50%) NONOBSTRUCTIVE LCX AND RCA STENOSES   . CARDIAC CATHETERIZATION N/A 03/10/2015   Procedure: Coronary Stent Intervention;  Surgeon: Sherren Mocha, MD;  Location: [facility]: PROX LAD - 95%--> 0% post PCI Vision BMS 3.5 x 18 (3.75 mm) - BMS used b/c upcoming knee Sgx.  . CHOLECYSTECTOMY    . inguinal herniorrhapy Bilateral    1979 & 1997  . KNEE ARTHROSCOPY Left    left  . Removal of right total knee implants for periprosthetic infection  10/02/2014  . Right knee arthrotomy, debridement, and reinsertion of antibiotic cement spacer  12/18/2014  . right knee surgury     x 9  . ROTATOR CUFF REPAIR Bilateral    bilateral  . TONSILLECTOMY    . TOTAL KNEE ARTHROPLASTY Right    right  . TOTAL KNEE ARTHROPLASTY Right 05/17/2015   Procedure: TOTAL KNEE ARTHROPLASTY, REMOVAL OF SPACER;  Surgeon: Dereck Leep, MD;  Location: [facility];  Service: Orthopedics;  Laterality: Right;  . TRANSTHORACIC ECHOCARDIOGRAM  03/10/2015   EF 65 and 70%. No RWMA. ~ normal diastolic parameters    reports that he has been smoking Cigarettes.  He has a 35.00 pack-year smoking history. He has quit using smokeless tobacco. He reports that he does not drink alcohol or use drugs.  family history includes Allergies in his son; Asthma in his maternal grandmother; Coronary artery disease in his other; Emphysema in his mother; Lung cancer in his father; Rheum arthritis in his sister. Allergies  Allergen Reactions  . Tramadol Itching  . Nsaids Nausea And Vomiting and Rash      Outpatient Encounter Prescriptions as of 04/18/2017  Medication Sig  . albuterol (VENTOLIN HFA) 108 (90 BASE) MCG/ACT inhaler Inhale 1-2 puffs into the lungs every 4 (four) hours as needed for wheezing or shortness of breath.   Marland Kitchen atorvastatin (LIPITOR)  80 MG tablet Take 1 tablet (80 mg total) by mouth daily at 6 PM.  . divalproex (DEPAKOTE ER) 500 MG 24 hr tablet Take by mouth daily.  Marland Kitchen HYDROcodone-acetaminophen (NORCO) 10-325 MG per tablet Take 1 tablet by mouth every 6 (six) hours as needed for moderate pain.  . Insulin Glargine (LANTUS) 100 UNIT/ML Solostar Pen Inject 10 Units into the skin daily. (Patient taking differently: Inject 55 Units into the skin daily. )  . ipratropium-albuterol (DUONEB) 0.5-2.5 (3) MG/3ML SOLN Take 3 mLs by nebulization every 6 (six) hours as needed (shortness of breath).  . meclizine (ANTIVERT) 12.5 MG tablet Take 12.5 mg by mouth daily as needed for dizziness.   . metoprolol tartrate (LOPRESSOR) 25 MG tablet Take 0.5 tablets (12.5 mg total) by mouth 2 (two) times daily.  . montelukast (SINGULAIR) 10 MG tablet Take 10 mg by mouth once.   . nitroGLYCERIN (NITROSTAT) 0.4 MG SL tablet Place 1 tablet (0.4 mg total) under the tongue every 5 (five) minutes as needed for chest pain.  . primidone (MYSOLINE) 50 MG tablet Take 1 tablet (50 mg total) by mouth 2 (two) times daily.  . QUEtiapine (SEROQUEL) 300 MG tablet Take 500 mg by mouth at bedtime.   . sitaGLIPtin (JANUVIA) 50 MG tablet Take 50 mg by mouth daily.  . ticagrelor (BRILINTA) 90 MG TABS tablet Take 1 tablet (90 mg total) by mouth 2 (two) times daily.   No facility-administered encounter medications on file as of 04/18/2017.      REVIEW OF SYSTEMS  : All other systems reviewed and negative except where noted in the History of Present Illness.   PHYSICAL EXAM: BP (!) 90/56   Pulse 80   Ht 5\' 10"  (1.778 m)   Wt 209 lb 9.6 oz (95.1 kg)   BMI 30.07 kg/m  General: Well developed white male in no acute distress Head: Normocephalic and atraumatic Eyes:  Sclerae anicteric, conjunctiva pink. Ears: Normal auditory acuity Lungs: Clear throughout to auscultation; no increased WOB. Heart: Regular rate and rhythm Abdomen: Soft, non-distended. Normal bowel  sounds.  Non-tender. Rectal:  Will be done at the time of colonoscopy. Musculoskeletal: Symmetrical with no gross deformities  Skin: No lesions on visible extremities Extremities: No edema  Neurological: Alert oriented x 4, grossly non-focal Psychological:  Alert and cooperative. Normal mood and affect  ASSESSMENT AND PLAN: -Personal history of colon polyps:  One tubular adenoma removed in 2012.  Is already scheduled for surveillance colonoscopy with Dr. Loletha Carrow.   -Chronic antiplatelet use with Brilinta due to CAD with stents:  Hold Brilinita for 5 days before procedure - will instruct when and how to resume after procedure. Risks and benefits of procedure including bleeding, perforation, infection, missed lesions, medication reactions and possible hospitalization or surgery if complications occur explained. Additional rare but real risk of cardiovascular event such as heart attack or ischemia/infarct of other organs off of Brilinta explained and need to  seek urgent help if this occurs. Will communicate by phone or EMR with patient's prescribing provider, Dr. Ellyn Hack, to confirm that holding Brilinta is reasonable in this case.  -IDDA:  Insulin will be adjusted prior to endoscopic procedure per protocol. Will resume normal dosing after procedure.   CC:  Antony Contras, MD

## 2017-04-18 NOTE — Patient Instructions (Addendum)
You have been scheduled for a colonoscopy. Please follow written instructions given to you at your visit today.  Please pick up your prep supplies at the pharmacy within the next 1-3 days. [place] [facility] [place]  If you use inhalers (even only as needed), please bring them with you on the day of your procedure. Your physician has requested that you go to www.startemmi.com and enter the access code given to you at your visit today. This web site gives a general overview about your procedure. However, you should still follow specific instructions given to you by our office regarding your preparation for the procedure.  We will call you when we get the Brilinta instructions from Dr. Ellyn Hack.

## 2017-04-18 NOTE — Telephone Encounter (Signed)
04/18/2017   RE: Chase Gibson DOB: 07/25/1956 MRN: [id number]   Dear Dr. Glenetta Hew,    We have scheduled the above patient for an endoscopic procedure. Our records show that he is on anticoagulation therapy.   Please advise as to how long the patient may come off his therapy of Brilinta prior to the procedure, which is scheduled for 04-30-2017.  Please route the Brilinta clearance instructions to [facility] CMA.    Sincerely,    Alonza Bogus PA-C

## 2017-04-19 NOTE — Telephone Encounter (Signed)
He is fine to stop Brilinta 5-7 days preprocedure.  If he can, it'll be nice to continue aspirin, but once he has had his procedure, and restarts Brilinta he does not need restart aspirin  Glenetta Hew, MD

## 2017-04-21 NOTE — Progress Notes (Signed)
Thank you for sending this case to me. I have reviewed the entire note, and the outlined plan seems appropriate.   Dianey Suchy Danis, MD  

## 2017-04-23 ENCOUNTER — Telehealth: Payer: Self-pay | Admitting: *Deleted

## 2017-04-23 NOTE — Telephone Encounter (Signed)
LM for the patient advising Dr.  Ellyn Hack gave [place] the Plavix clearance instructions for the colonoscopy scheduled for 04-30-2017.  He is to hold the Plavix on 7-4 through and including 7-9. He can resume on 05-01-2017.  I asked the patient to call me back so I know he heard and understands the instructions.

## 2017-04-23 NOTE — Telephone Encounter (Signed)
LM for the patient to please call me. I left him instructions for the Brilinta. He can hold it on 04-25-2017, thru and including 04-30-2017.  He can resume in on 05-01-2017. Asked him to please call me back so I know he got and understands these instructions.

## 2017-04-23 NOTE — Telephone Encounter (Signed)
The patient did call me back and verbally stated he understands the instructions for the Brilinta clearance  for the colonoscopy.

## 2017-04-30 ENCOUNTER — Encounter: Payer: Self-pay | Admitting: Gastroenterology

## 2017-04-30 ENCOUNTER — Ambulatory Visit (AMBULATORY_SURGERY_CENTER): Payer: Medicare Other | Admitting: Gastroenterology

## 2017-04-30 VITALS — BP 103/67 | HR 67 | Temp 98.6°F | Resp 17 | Ht 70.0 in | Wt 209.0 lb

## 2017-04-30 DIAGNOSIS — Z86010 Personal history of colonic polyps: Secondary | ICD-10-CM

## 2017-04-30 DIAGNOSIS — D128 Benign neoplasm of rectum: Secondary | ICD-10-CM

## 2017-04-30 DIAGNOSIS — D126 Benign neoplasm of colon, unspecified: Secondary | ICD-10-CM | POA: Diagnosis not present

## 2017-04-30 DIAGNOSIS — K635 Polyp of colon: Secondary | ICD-10-CM

## 2017-04-30 DIAGNOSIS — K621 Rectal polyp: Secondary | ICD-10-CM

## 2017-04-30 DIAGNOSIS — D123 Benign neoplasm of transverse colon: Secondary | ICD-10-CM

## 2017-04-30 DIAGNOSIS — D129 Benign neoplasm of anus and anal canal: Secondary | ICD-10-CM

## 2017-04-30 MED ORDER — SODIUM CHLORIDE 0.9 % IV SOLN: 500.0000 mL | INTRAVENOUS | Status: AC

## 2017-04-30 NOTE — Patient Instructions (Signed)
YOU HAD AN ENDOSCOPIC PROCEDURE TODAY AT [facility]:   Refer to the procedure report that was given to you for any specific questions about what was found during the examination.  If the procedure report does not answer your questions, please call your gastroenterologist to clarify.  If you requested that your care partner not be given the details of your procedure findings, then the procedure report has been included in a sealed envelope for you to review at your convenience later.  YOU SHOULD EXPECT: Some feelings of bloating in the abdomen. Passage of more gas than usual.  Walking can help get rid of the air that was put into your GI tract during the procedure and reduce the bloating. If you had a lower endoscopy (such as a colonoscopy or flexible sigmoidoscopy) you may notice spotting of blood in your stool or on the toilet paper. If you underwent a bowel prep for your procedure, you may not have a normal bowel movement for a few days.  Please Note:  You might notice some irritation and congestion in your nose or some drainage.  This is from the oxygen used during your procedure.  There is no need for concern and it should clear up in a day or so.  SYMPTOMS TO REPORT IMMEDIATELY:   Following lower endoscopy (colonoscopy or flexible sigmoidoscopy):  Excessive amounts of blood in the stool  Significant tenderness or worsening of abdominal pains  Swelling of the abdomen that is new, acute  Fever of 100F or higher  For urgent or emergent issues, a gastroenterologist can be reached at any hour by calling [phone].   DIET:  We do recommend a small meal at first, but then you may proceed to your regular diet.  Drink plenty of fluids but you should avoid alcoholic beverages for 24 hours.  ACTIVITY:  You should plan to take it easy for the rest of today and you should NOT DRIVE or use heavy machinery until tomorrow (because of the sedation medicines used during the test).     FOLLOW UP: Our staff will call the number listed on your records the next business day following your procedure to check on you and address any questions or concerns that you may have regarding the information given to you following your procedure. If we do not reach you, we will leave a message.  However, if you are feeling well and you are not experiencing any problems, there is no need to return our call.  We will assume that you have returned to your regular daily activities without incident.  If any biopsies were taken you will be contacted by phone or by letter within the next 1-3 weeks.  Please call us at [phone] if you have not heard about the biopsies in 3 weeks.   Await for biopsy results to determine next repeat Colonoscopy screening Polyps (handout given) Hemorrhoids (handout given) Resume antiplatelet medication (Brilints) at prior dose tomorrow   SIGNATURES/CONFIDENTIALITY: You and/or your care partner have signed paperwork which will be entered into your electronic medical record.  These signatures attest to the fact that that the information above on your After Visit Summary has been reviewed and is understood.  Full responsibility of the confidentiality of this discharge information lies with you and/or your care-partner.

## 2017-04-30 NOTE — Progress Notes (Signed)
To PACU, vss patent aw report to rn 

## 2017-04-30 NOTE — Progress Notes (Signed)
Called to room to assist during endoscopic procedure.  Patient ID and intended procedure confirmed with present staff. Received instructions for my participation in the procedure from the performing physician.  

## 2017-04-30 NOTE — Progress Notes (Signed)
Pt. Reports no change in his surgical or medical history since pre-visit 04/18/2017.

## 2017-04-30 NOTE — Op Note (Addendum)
[facility] Patient Name: Chase Gibson Procedure Date: 04/30/2017 1:32 PM MRN: [id number] Endoscopist: Mallie Mussel L. Loletha Carrow , MD Age: 61 Referring MD:  Date of Birth: 09/07/1956 Gender: Male Account #: [id number] Procedure:                Colonoscopy Indications:              Surveillance: Personal history of adenomatous                            polyps on last colonoscopy > 5 years ago Medicines:                Monitored Anesthesia Care Procedure:                Pre-Anesthesia Assessment:                           - Prior to the procedure, a History and Physical                            was performed, and patient medications and                            allergies were reviewed. The patient's tolerance of                            previous anesthesia was also reviewed. The risks                            and benefits of the procedure and the sedation                            options and risks were discussed with the patient.                            All questions were answered, and informed consent                            was obtained. Prior Anticoagulants: The patient has                            taken antiplatelet medication (Brilinta), last dose                            was 5 days prior to procedure. ASA Grade                            Assessment: III - A patient with severe systemic                            disease. After reviewing the risks and benefits,                            the patient was deemed in satisfactory condition to  undergo the procedure.                           After obtaining informed consent, the colonoscope                            was passed under direct vision. Throughout the                            procedure, the patient's blood pressure, pulse, and                            oxygen saturations were monitored continuously. The                            Model CF-HQ190L [id number]) scope was  introduced                            through the anus and advanced to the the cecum,                            identified by appendiceal orifice and ileocecal                            valve. The colonoscopy was performed without                            difficulty. The patient tolerated the procedure                            well. The quality of the bowel preparation was                            good. The ileocecal valve, appendiceal orifice, and                            rectum were photographed. The quality of the bowel                            preparation was evaluated using the BBPS [facility]                            Bowel Preparation Scale) with scores of: Right                            Colon = 2, Transverse Colon = 2 and Left Colon = 2.                            The total BBPS score equals 6. The bowel                            preparation used was SUPREP. Scope In: 1:39:00 PM Scope Out: 1:55:23 PM Scope Withdrawal Time: 0 hours 11 minutes 39 seconds  Total Procedure Duration: 0 hours 16 minutes 23 seconds  Findings:                 The perianal and digital rectal examinations were                            normal.                           Three sessile polyps were found in the rectum and                            splenic flexure. The polyps were 2 to 4 mm in size.                            These polyps were removed with a cold snare.                            Resection and retrieval were complete.                           Internal hemorrhoids were found. The hemorrhoids                            were small and Grade I (internal hemorrhoids that                            do not prolapse).                           The exam was otherwise without abnormality on                            direct and retroflexion views. Complications:            No immediate complications. Estimated Blood Loss:     Estimated blood loss: none. Impression:               - Three  2 to 4 mm polyps in the rectum and at the                            splenic flexure, removed with a cold snare.                            Resected and retrieved.                           - Internal hemorrhoids.                           - The examination was otherwise normal on direct                            and retroflexion views. Recommendation:           - Patient has a contact number available for  emergencies. The signs and symptoms of potential                            delayed complications were discussed with the                            patient. Return to normal activities tomorrow.                            Written discharge instructions were provided to the                            patient.                           - Resume previous diet.                           - Continue present medications.                           - Await pathology results.                           - Repeat colonoscopy is recommended for                            surveillance. The colonoscopy date will be                            determined after pathology results from today's                            exam become available for review.                           - Resume antiplatelet medication (Brilinta) at                            prior dose tomorrow. Paizley Ramella L. Loletha Carrow, MD 04/30/2017 1:58:23 PM This report has been signed electronically.

## 2017-05-01 ENCOUNTER — Telehealth: Payer: Self-pay

## 2017-05-01 NOTE — Telephone Encounter (Signed)
  Follow up Call-  Call back number 04/30/2017  Post procedure Call Back phone  # [phone]  Permission to leave phone message Yes  Some recent data might be hidden     Left message

## 2017-05-01 NOTE — Telephone Encounter (Signed)
  Follow up Call-  Call back number 04/30/2017  Post procedure Call Back phone  # [phone]  Permission to leave phone message Yes  Some recent data might be hidden     Patient questions:  Do you have a fever, pain , or abdominal swelling? No. Pain Score  0 *  Have you tolerated food without any problems? Yes.    Have you been able to return to your normal activities? Yes.    Do you have any questions about your discharge instructions: Diet   No. Medications  No. Follow up visit  No.  Do you have questions or concerns about your Care? No.  Actions: * If pain score is 4 or above: No action needed, pain <4.

## 2017-05-07 ENCOUNTER — Encounter: Payer: Self-pay | Admitting: Gastroenterology

## 2017-05-08 NOTE — Progress Notes (Deleted)
Chase Gibson was seen today in the movement disorders clinic for neurologic consultation at the request of Antony Contras, MD.  The consultation is for the evaluation of tremor.  The records that were made available to me were reviewed.   Tremor: Yes.     How long has it been going on? 10+ years but worse over the last 6 weeks.  Denies med changes in 6-8 weeks.    At rest or with activation?  activation  When is it noted the most?  Drinking, eating  Fam hx of tremor?  No.  Located where?  L more than R hand (he is ambidexterous but writes with the R hand)  Affected by caffeine:  unknown (drinks 3 cups coffee in AM)  Affected by alcohol:  Doesn't drink alcohol  Affected by stress:  No.  Affected by fatigue:  No.  Spills soup if on spoon:  May or may not  Spills glass of liquid if full:  May or may not  Affects ADL's (tying shoes, brushing teeth, etc):  No.  (and shaves with a blade)  Pt on seroquel XR 500 (takes a 300 plus a 200) mg and that is RX by psychiatry, Dr. Toniann Fail at [facility] in [place]  Also notes "my eyes have been blinking more" x 1 month.  Thought that it was due to wearing contacts more.     Other specific sx's: Voice: no changes Sleep: states that uses seroquel to sleep and not for mood  Vivid Dreams:  No.  Acting out dreams:  No. Wet Pillows: No. Postural symptoms:  No.  Falls?  No. Bradykinesia symptoms: no bradykinesia noted Loss of smell:  No. Loss of taste:  No. Urinary Incontinence:  No. Difficulty Swallowing:  No. Handwriting, micrographia: No. Depression:  No. (well controlled with VPA x 20 years) Memory changes:  No. Hallucinations:  No.  visual distortions: No. N/V:  No. Lightheaded:  Yes.   (takes meclizine for that)  Syncope: No. Diplopia:  No. Dyskinesia:  No.  Neuroimaging has not previously been performed.  Meds that may cause tremor: VPA, albuterol (uses several times a day),duoneb (uses daily), singulair, seroquel,  05/10/17  update:  Patient is seen today in follow-up.  Last visit, he wanted to try primidone to see if that would help his tremor, although he is on several tremor producing drugs.  He called me and wanted to increase the medication from 50 mg daily to 50 mg twice a day.  He ended up calling back and felt that this increased his sex drive.  I did not think that this may particularly sense, as this medication was not associated with that.  In fact, this medication can be associated with impotency but not with increased sex drive.  The patient was told to hold the medication.  He felt that this resolved symptomatology.  Unfortunately, I had told the patient that I really had nothing further to offer in terms of tremor control.  He had tried and failed Cogentin.  He is not a candidate for a beta blocker.  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  ALLERGIES:   Allergies  Allergen Reactions  . Tramadol Itching  . Nsaids Nausea And Vomiting and Rash    CURRENT MEDICATIONS:  Outpatient Encounter Prescriptions as of 05/10/2017  Medication Sig  . albuterol (VENTOLIN HFA) 108 (90 BASE) MCG/ACT inhaler Inhale 1-2 puffs into  the lungs every 4 (four) hours as needed for wheezing or shortness of breath.   Marland Kitchen atorvastatin (LIPITOR) 80 MG tablet Take 1 tablet (80 mg total) by mouth daily at 6 PM.  . divalproex (DEPAKOTE ER) 500 MG 24 hr tablet Take by mouth daily.  Marland Kitchen HYDROcodone-acetaminophen (NORCO) 10-325 MG per tablet Take 1 tablet by mouth every 6 (six) hours as needed for moderate pain.  . Insulin Glargine (LANTUS) 100 UNIT/ML Solostar Pen Inject 10 Units into the skin daily. (Patient taking differently: Inject 55 Units into the skin daily. )  . ipratropium-albuterol (DUONEB) 0.5-2.5 (3) MG/3ML SOLN Take 3 mLs by nebulization every 6 (six) hours as needed (shortness of breath).  . losartan (COZAAR) 25 MG tablet TK 1 T PO QD  .  meclizine (ANTIVERT) 12.5 MG tablet Take 12.5 mg by mouth daily as needed for dizziness.   . metoprolol tartrate (LOPRESSOR) 25 MG tablet Take 0.5 tablets (12.5 mg total) by mouth 2 (two) times daily.  . montelukast (SINGULAIR) 10 MG tablet Take 10 mg by mouth once.   . nitroGLYCERIN (NITROSTAT) 0.4 MG SL tablet Place 1 tablet (0.4 mg total) under the tongue every 5 (five) minutes as needed for chest pain. (Patient not taking: Reported on 04/30/2017)  . primidone (MYSOLINE) 50 MG tablet Take 1 tablet (50 mg total) by mouth 2 (two) times daily.  . QUEtiapine (SEROQUEL) 300 MG tablet Take 500 mg by mouth at bedtime.   . sitaGLIPtin (JANUVIA) 50 MG tablet Take 50 mg by mouth daily.  . ticagrelor (BRILINTA) 90 MG TABS tablet Take 1 tablet (90 mg total) by mouth 2 (two) times daily.   Facility-Administered Encounter Medications as of 05/10/2017  Medication  . 0.9 %  sodium chloride infusion    PAST MEDICAL HISTORY:   Past Medical History:  Diagnosis Date  . ALLERGIC RHINITIS 03/18/2009  . ANXIETY 06/24/2007   pt denies at PAT visit on 02/25/15  . ASTHMA 06/24/2007  . BREAST PAIN, LEFT 07/21/2008  . CAD S/P percutaneous coronary angioplasty 03/11/2015   SEVERE SINGLE VESSEL CAD OF THE PROX LAD - 95%--> 0% post PCI Vision BMS 3.5 x 18 (3.75 mm); MILD-MODERATE (~50%) NONOBSTRUCTIVE LCX AND RCA STENOSES   . CKD (chronic kidney disease), stage III   . Colon polyps 03/28/2012  . COPD (chronic obstructive pulmonary disease) ([place]) 03/28/2012  . DEPRESSION 06/24/2007  . DIABETES MELLITUS, TYPE II 07/21/2008  . GERD 08/25/2008   pt denies at PAT visit on 02/25/15  . History of non-ST elevation myocardial infarction (NSTEMI) 03/11/2015   Mild Troponin ~0.45, transient Anterior ST-T changes; LAD stenosis treated with BMS  . HYPERLIPIDEMIA 07/21/2008  . INSOMNIA-SLEEP DISORDER-UNSPEC 03/28/2010  . LOW BACK PAIN 09/03/2007  . NAUSEA 06/14/2010  . OSTEOARTHRITIS, KNEES, BILATERAL 08/25/2008  . PERIPHERAL EDEMA 07/21/2008  .  SYMPTOM, ABNORMAL INVOLUNTARY MOVEMENT NEC 06/21/2007  . Tobacco abuse   . Vertigo 11/2014   pt has a bout every 3-4 months, no pattern  . Wheezing 07/13/2009    PAST SURGICAL HISTORY:   Past Surgical History:  Procedure Laterality Date  . APPENDECTOMY    . CARDIAC CATHETERIZATION N/A 03/10/2015   Procedure: Left Heart Cath and Coronary Angiography;  Surgeon: Sherren Mocha, MD;  Location: [facility];  Service: Cardiovascula: EVERE SINGLE VESSEL CAD OF THE PROX LAD - 95%--> BMS PCI. MILD-MODERATE (~50%) NONOBSTRUCTIVE LCX AND RCA STENOSES   . CARDIAC CATHETERIZATION N/A 03/10/2015   Procedure: Coronary Stent Intervention;  Surgeon: Sherren Mocha,  MD;  Location: [facility]: PROX LAD - 95%--> 0% post PCI Vision BMS 3.5 x 18 (3.75 mm) - BMS used b/c upcoming knee Sgx.  . CHOLECYSTECTOMY    . inguinal herniorrhapy Bilateral    1979 & 1997  . KNEE ARTHROSCOPY Left    left  . Removal of right total knee implants for periprosthetic infection  10/02/2014  . Right knee arthrotomy, debridement, and reinsertion of antibiotic cement spacer  12/18/2014  . right knee surgury     x 9  . ROTATOR CUFF REPAIR Bilateral    bilateral  . TONSILLECTOMY    . TOTAL KNEE ARTHROPLASTY Right    right  . TOTAL KNEE ARTHROPLASTY Right 05/17/2015   Procedure: TOTAL KNEE ARTHROPLASTY, REMOVAL OF SPACER;  Surgeon: Dereck Leep, MD;  Location: [facility];  Service: Orthopedics;  Laterality: Right;  . TRANSTHORACIC ECHOCARDIOGRAM  03/10/2015   EF 65 and 70%. No RWMA. ~ normal diastolic parameters    SOCIAL HISTORY:   Social History   Social History  . Marital status: Divorced    Spouse name: N/A  . Number of children: 2  . Years of education: N/A   Occupational History  . Not on file.   Social History Main Topics  . Smoking status: Current Every Day Smoker    Packs/day: 1.00    Years: 35.00    Types: Cigarettes    Last attempt to quit: 12/02/2012  . Smokeless tobacco: Former Systems developer  .  Alcohol use No  . Drug use: No  . Sexual activity: Not on file   Other Topics Concern  . Not on file   Social History Narrative   Works as a Biochemist, clinical. Was actually not working at the time of his MI because of pain from his knee. He was scheduled to have right knee total replacement the week after his MI.   With long-term smoker, who quit following his MI.   He has a huge Public librarian with tattoos    FAMILY HISTORY:   Family Status  Relation Status  . Other (Not Specified)  . Mother Alive  . Son Alive  . MGM (Not Specified)  . Sister Alive  . Father Deceased  . Daughter Alive    ROS:  A complete 10 system review of systems was obtained and was unremarkable apart from what is mentioned above.  PHYSICAL EXAMINATION:    VITALS:   There were no vitals filed for this visit.  GEN:  The patient appears stated age and is in NAD. HEENT:  Normocephalic, atraumatic.  The mucous membranes are moist. The superficial temporal arteries are without ropiness or tenderness. CV:  RRR Lungs:  CTAB Neck/HEME:  There are no carotid bruits bilaterally.  Neurological examination:  Orientation: The patient is alert and oriented x3. Fund of knowledge is appropriate.  Recent and remote memory are intact.  Attention and concentration are normal.    Able to name objects and repeat phrases. Cranial nerves: There is good facial symmetry. There is blepharospasm.  Pupils are equal round and reactive to light bilaterally. Fundoscopic exam reveals clear margins bilaterally. Extraocular muscles are intact. The visual fields are full to confrontational testing. The speech is fluent and clear. Soft palate rises symmetrically and there is no tongue deviation. Hearing is intact to conversational tone. Sensation: Sensation is intact to light and pinprick throughout (facial, trunk, extremities). Vibration is intact at the bilateral big toe. There is no extinction with double simultaneous  stimulation.  There is no sensory dermatomal level identified. Motor: Strength is 5/5 in the bilateral upper and lower extremities.   Shoulder shrug is equal and symmetric.  There is no pronator drift. Deep tendon reflexes: Deep tendon reflexes are 2/4 at the bilateral biceps, triceps, brachioradialis, left patella, absent at the right patella and 1/4 at the bilateral achilles. Plantar responses are downgoing bilaterally.  Movement examination: Tone: There is normal tone in the bilateral upper extremities.  The tone in the lower extremities is normal.  Abnormal movements: There is tremor of outstretched hands that increases only slightly with intention.  The L is worse than the right.   Coordination:  There is no decremation with RAM's, with any form of RAMS, including alternating supination and pronation of the forearm, hand opening and closing, finger taps, heel taps and toe taps. Gait and Station: The patient has no difficulty arising out of a deep-seated chair without the use of the hands.  He is able to ambulate in a tandem fashion.    Addendum (labs)  Received lab work from patient's prior care physician dated 08/28/2016.  White blood cells were 5.3, hemoglobin 12.4, hematocrit 34.3 and platelets 183.  Sodium was 139, potassium 3.6, chloride 107, CO2 26, BUN 22, creatinine was elevated at 1.93.  AST was 15 ALT was 8 and alkaline phosphatase 85.  His hemoglobin A1c was 5.4.  His TSH was normal at 0.79.   ASSESSMENT/PLAN:  1.  Tremor  -He is on multiple meds that could cause tremor including VPA, albuterol,duoneb, singulair,seroquel.  He reports that he has been on VPA for 20 years and seroquel for 10-15 years.  However, he does not look parkinsonian and generally if it is from Seroquel, it produces a parkinsonian tremor.  Regardless, the patient states that he is not able to get off of Depakote and has tried several times.  His Seroquel was just recently reduced and he does not know if it will be able to be  reduced further.    -tried cogentin without relief and caused dry mouth   -He is not a candidate for beta blocker because of COPD and depression.  2.  Blepharospasm  -This may be tardive in nature.  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  He states that his psychiatrist was trying to reduce Seroquel because of this.  He and I discussed the use of Botox.  He is not interested right now.   -MRI ordered but the patient called and canceled it and it was not completed  3.  Follow up is anticipated in the next few months, sooner should new neurologic issues arise.  In the meantime, I will try to get labs from PCP.  Much greater than 50% of this visit was spent in counseling and coordinating care.  Total face to face time:  60 min   Cc:  Antony Contras, MD

## 2017-05-10 ENCOUNTER — Ambulatory Visit: Payer: Medicare Other | Admitting: Neurology

## 2017-05-25 ENCOUNTER — Ambulatory Visit: Payer: Medicare Other | Admitting: Cardiology

## 2017-06-05 ENCOUNTER — Other Ambulatory Visit: Payer: Self-pay | Admitting: Cardiology

## 2017-06-05 MED ORDER — TICAGRELOR 90 MG PO TABS: 90.0000 mg | ORAL_TABLET | Freq: Two times a day (BID) | ORAL | 0 refills | Status: DC

## 2017-06-05 NOTE — Telephone Encounter (Signed)
Patient directly notified. Patient voiced understanding.

## 2017-06-05 NOTE — Telephone Encounter (Signed)
°*  STAT* If patient is at the pharmacy, call can be transferred to refill team.   1. Which medications need to be refilled? (please list name of each medication and dose if known) Brilinta  2. Which pharmacy/location (including street and city if local pharmacy) is medication to be sent to?[facility]-[phone]  3. Do they need a 30 day or 90 day supply? 90 and refills

## 2017-06-21 ENCOUNTER — Other Ambulatory Visit: Payer: Self-pay | Admitting: Cardiology

## 2017-06-21 NOTE — Telephone Encounter (Signed)
REFILL 

## 2017-06-26 ENCOUNTER — Ambulatory Visit: Payer: Medicare Other | Admitting: Cardiology

## 2017-08-07 ENCOUNTER — Ambulatory Visit: Payer: Medicare Other | Admitting: Cardiology

## 2017-08-09 ENCOUNTER — Encounter (HOSPITAL_COMMUNITY): Payer: Self-pay | Admitting: Emergency Medicine

## 2017-08-09 DIAGNOSIS — Z5321 Procedure and treatment not carried out due to patient leaving prior to being seen by health care provider: Secondary | ICD-10-CM | POA: Insufficient documentation

## 2017-08-09 DIAGNOSIS — R7309 Other abnormal glucose: Secondary | ICD-10-CM | POA: Insufficient documentation

## 2017-08-09 LAB — BASIC METABOLIC PANEL
ANION GAP: 9 (ref 5–15)
BUN: 19 mg/dL (ref 6–20)
CHLORIDE: 101 mmol/L (ref 101–111)
CO2: 22 mmol/L (ref 22–32)
Calcium: 9.1 mg/dL (ref 8.9–10.3)
Creatinine, Ser: 1.86 mg/dL — ABNORMAL HIGH (ref 0.61–1.24)
GFR calc non Af Amer: 38 mL/min — ABNORMAL LOW (ref >60)
GFR, EST AFRICAN AMERICAN: 44 mL/min — AB (ref >60)
GLUCOSE: 467 mg/dL — AB (ref 65–99)
POTASSIUM: 4.5 mmol/L (ref 3.5–5.1)
Sodium: 132 mmol/L — ABNORMAL LOW (ref 135–145)

## 2017-08-09 LAB — CBC
HEMATOCRIT: 35.6 % — AB (ref 39.0–52.0)
HEMOGLOBIN: 12.8 g/dL — AB (ref 13.0–17.0)
MCH: 32.1 pg (ref 26.0–34.0)
MCHC: 36 g/dL (ref 30.0–36.0)
MCV: 89.2 fL (ref 78.0–100.0)
Platelets: 127 10*3/uL — ABNORMAL LOW (ref 150–400)
RBC: 3.99 MIL/uL — AB (ref 4.22–5.81)
RDW: 14.8 % (ref 11.5–15.5)
WBC: 7.8 10*3/uL (ref 4.0–10.5)

## 2017-08-09 LAB — CBG MONITORING, ED
GLUCOSE-CAPILLARY: 464 mg/dL — AB (ref 65–99)
Glucose-Capillary: 463 mg/dL — ABNORMAL HIGH (ref 65–99)

## 2017-08-09 NOTE — ED Triage Notes (Signed)
Pt states his blood sugar has been elevated for a week or so  Pt states his A1C went from 5 to 11 in the past 5 months  Pt states he realized it is because all he has been drinking lately is cheerwine and coffee  Pt states for the past week he has been thirsty and the more cheerwine he drank the more thirsty he has become  Pt states his dr sent him here to get his sugar down

## 2017-08-10 ENCOUNTER — Emergency Department (HOSPITAL_COMMUNITY)
Admission: EM | Admit: 2017-08-10 | Discharge: 2017-08-10 | Disposition: A | Discharge status: Home / Self Care | Payer: Medicare Other | Attending: Emergency Medicine | Admitting: Emergency Medicine

## 2017-08-10 LAB — CBG MONITORING, ED: Glucose-Capillary: 371 mg/dL — ABNORMAL HIGH (ref 65–99)

## 2017-08-10 NOTE — ED Notes (Signed)
Pt states he feels better and he is going to go home and follow up with his dr

## 2017-09-04 ENCOUNTER — Other Ambulatory Visit: Payer: Self-pay | Admitting: Cardiology

## 2017-09-04 NOTE — Telephone Encounter (Signed)
Rx(s) sent to pharmacy electronically.  

## 2017-09-07 ENCOUNTER — Encounter
Admission: RE | Admit: 2017-09-07 | Discharge: 2017-09-07 | Disposition: A | Discharge status: Home / Self Care | Payer: Medicare Other | Source: Ambulatory Visit | Attending: Orthopedic Surgery | Admitting: Orthopedic Surgery

## 2017-09-07 ENCOUNTER — Inpatient Hospital Stay: Admission: RE | Admit: 2017-09-07 | Payer: Medicare Other | Source: Ambulatory Visit

## 2017-09-07 ENCOUNTER — Other Ambulatory Visit: Payer: Self-pay

## 2017-09-07 ENCOUNTER — Telehealth: Payer: Self-pay | Admitting: Cardiology

## 2017-09-07 DIAGNOSIS — E119 Type 2 diabetes mellitus without complications: Secondary | ICD-10-CM | POA: Insufficient documentation

## 2017-09-07 DIAGNOSIS — Z01812 Encounter for preprocedural laboratory examination: Secondary | ICD-10-CM | POA: Insufficient documentation

## 2017-09-07 DIAGNOSIS — Z01810 Encounter for preprocedural cardiovascular examination: Secondary | ICD-10-CM | POA: Diagnosis present

## 2017-09-07 HISTORY — DX: Acute myocardial infarction, unspecified: I21.9

## 2017-09-07 LAB — SURGICAL PCR SCREEN
MRSA, PCR: NEGATIVE
Staphylococcus aureus: POSITIVE — AB

## 2017-09-07 LAB — HEMOGLOBIN A1C
HEMOGLOBIN A1C: 9.1 % — AB (ref 4.8–5.6)
MEAN PLASMA GLUCOSE: 214.47 mg/dL

## 2017-09-07 NOTE — Telephone Encounter (Signed)
New message   Patient called this in      [name] Pre-operative Risk Assessment    Request for surgical clearance:  What type of surgery is being performed?  Knee surgery  1. When is this surgery scheduled? 11/26  2. Are there any medications that need to be held prior to surgery and how long? Brillinta  3. Practice name and name of physician performing surgery?  Dr Marry Guan - [place] (on epic)  4. What is your office phone and fax number?    5. Anesthesia type (None, local, MAC, general) ?  Patient did not know    Chase Gibson 09/07/2017, 3:35 PM  _________________________________________________________________   (provider comments below)

## 2017-09-07 NOTE — Telephone Encounter (Signed)
   Chart reviewed as part of pre-operative protocol coverage.   Dr Ellyn Hack has given permission in the past to hold the Brilinta for 5 days preop for a procedure.  Resume ASAP after the procedure, per the surgeon.  He needs arthroscopic knee surgery.  Mr [place] saw the PA today for preop evaluation. He had an ECG. Pt states he was told that because his ECG is ok, no cardiology clearance for the surgery needed.   Hold the Brilinta starting 11/21 and resume it on 11/28 after the surgery.  The patient is aware.  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   He has appt w/ Dr Ellyn Hack 12/04 and says will keep it.   I will route this recommendation to the requesting party via Epic fax function and remove from pre-op pool.  Please call with questions.  Rosaria Ferries, PA-C 09/07/2017, 4:40 PM

## 2017-09-07 NOTE — Patient Instructions (Signed)
Your procedure is scheduled on: Monday 09/17/17 Report to [facility]. 2ND FLOOR MEDICAL MALL ENTRANCE. To find out your arrival time please call [phone] between 1PM - 3PM on Friday 09/14/17.  Remember: Instructions that are not followed completely may result in serious medical risk, up to and including death, or upon the discretion of your surgeon and anesthesiologist your surgery may need to be rescheduled.    __X__ 1. Do not eat anything after midnight the night before your    procedure.  No gum chewing or hard candies.  You may drink clear   liquids up to 2 hours before you are scheduled to arrive at the   hospital for your procedure. Do not drink clear liquids within 2   hours of scheduled arrival to the hospital as this may lead to your   procedure being delayed or rescheduled.       Clear liquids include:   Water or Apple juice without pulp   Clear carbohydrate beverage such as Clearfast or Gatorade   Black coffee or Clear Tea (no milk, no creamer, do not add anything   to the coffee or tea)    Diabetics should only drink water   __X__ 2. No Alcohol for 24 hours before or after surgery.   ____ 3. Bring all medications with you on the day of surgery if instructed.    __X__ 4. Notify your doctor if there is any change in your medical condition     (cold, fever, infections).             __X___5. No smoking within 24 hours of your surgery.     Do not wear jewelry, make-up, hairpins, clips or nail polish.  Do not wear lotions, powders, or perfumes.   Do not shave 48 hours prior to surgery. Men may shave face and neck.  Do not bring valuables to the hospital.    [facility] is not responsible for any belongings or valuables.               Contacts, dentures or bridgework may not be worn into surgery.  Leave your suitcase in the car. After surgery it may be brought to your room.  For patients admitted to the hospital, discharge time is determined by your                 treatment team.   Patients discharged the day of surgery will not be allowed to drive home.   Please read over the following fact sheets that you were given:   MRSA Information   __X__ Take these medicines the morning of surgery with A SIP OF WATER:    1. NONE  2.   3.   4.  5.  6.  ____ Fleet Enema (as directed)   __X__ Use CHG Soap/SAGE wipes as directed  __X__ Use inhalers on the day of surgery / AND NEBULIZER  ____ Stop metformin 2 days prior to surgery    ____ Take 1/2 of usual insulin dose the night before surgery and none on the morning of surgery.   __X__ Stop Coumadin/Plavix/aspirin on CONTACT CARDIOLOGIST REGARDING ABILITY TO STOP BRILINTA  __X__ Stop Anti-inflammatories such as Advil, Aleve, Ibuprofen, Motrin, Naproxen, Naprosyn, Goodies,powder, or aspirin products.  OK to take Tylenol.   __X__ Stop supplements, Vitamin E, Fish Oil until after surgery.    ____ Bring C-Pap to the hospital.

## 2017-09-07 NOTE — Telephone Encounter (Signed)
Clearance routed via Epic fax to Dr. Marry Guan.

## 2017-09-08 NOTE — Pre-Procedure Instructions (Signed)
Positive staph aureus results sent to Dr. Marry Guan for review.  Asked if wanted any treatment?

## 2017-09-16 MED ORDER — CEFAZOLIN SODIUM-DEXTROSE 2-4 GM/100ML-% IV SOLN
2.0000 g | INTRAVENOUS | Status: AC
  Administered 2017-09-17: 2 g via INTRAVENOUS

## 2017-09-17 ENCOUNTER — Encounter
Admission: RE | Disposition: A | Discharge status: Home / Self Care | Payer: Self-pay | Source: Ambulatory Visit | Attending: Orthopedic Surgery

## 2017-09-17 ENCOUNTER — Ambulatory Visit: Payer: Medicare Other | Admitting: Anesthesiology

## 2017-09-17 ENCOUNTER — Ambulatory Visit
Admission: RE | Admit: 2017-09-17 | Discharge: 2017-09-17 | Disposition: A | Discharge status: Home / Self Care | Payer: Medicare Other | Source: Ambulatory Visit | Attending: Orthopedic Surgery | Admitting: Orthopedic Surgery

## 2017-09-17 ENCOUNTER — Encounter: Payer: Self-pay | Admitting: Orthopedic Surgery

## 2017-09-17 ENCOUNTER — Other Ambulatory Visit: Payer: Self-pay

## 2017-09-17 DIAGNOSIS — M94262 Chondromalacia, left knee: Secondary | ICD-10-CM | POA: Diagnosis not present

## 2017-09-17 DIAGNOSIS — J449 Chronic obstructive pulmonary disease, unspecified: Secondary | ICD-10-CM | POA: Insufficient documentation

## 2017-09-17 DIAGNOSIS — Z79899 Other long term (current) drug therapy: Secondary | ICD-10-CM | POA: Insufficient documentation

## 2017-09-17 DIAGNOSIS — Z96651 Presence of right artificial knee joint: Secondary | ICD-10-CM | POA: Diagnosis not present

## 2017-09-17 DIAGNOSIS — Z7902 Long term (current) use of antithrombotics/antiplatelets: Secondary | ICD-10-CM | POA: Diagnosis not present

## 2017-09-17 DIAGNOSIS — E785 Hyperlipidemia, unspecified: Secondary | ICD-10-CM | POA: Insufficient documentation

## 2017-09-17 DIAGNOSIS — I252 Old myocardial infarction: Secondary | ICD-10-CM | POA: Diagnosis not present

## 2017-09-17 DIAGNOSIS — Z794 Long term (current) use of insulin: Secondary | ICD-10-CM | POA: Diagnosis not present

## 2017-09-17 DIAGNOSIS — F17210 Nicotine dependence, cigarettes, uncomplicated: Secondary | ICD-10-CM | POA: Insufficient documentation

## 2017-09-17 DIAGNOSIS — I2510 Atherosclerotic heart disease of native coronary artery without angina pectoris: Secondary | ICD-10-CM | POA: Insufficient documentation

## 2017-09-17 DIAGNOSIS — M23222 Derangement of posterior horn of medial meniscus due to old tear or injury, left knee: Secondary | ICD-10-CM | POA: Diagnosis not present

## 2017-09-17 DIAGNOSIS — M25562 Pain in left knee: Secondary | ICD-10-CM | POA: Diagnosis present

## 2017-09-17 DIAGNOSIS — E119 Type 2 diabetes mellitus without complications: Secondary | ICD-10-CM | POA: Insufficient documentation

## 2017-09-17 HISTORY — PX: KNEE ARTHROSCOPY: SHX127

## 2017-09-17 LAB — GLUCOSE, CAPILLARY
GLUCOSE-CAPILLARY: 115 mg/dL — AB (ref 65–99)
Glucose-Capillary: 128 mg/dL — ABNORMAL HIGH (ref 65–99)

## 2017-09-17 SURGERY — ARTHROSCOPY, KNEE
Anesthesia: General | Site: Knee | Laterality: Left | Wound class: Clean

## 2017-09-17 MED ORDER — ONDANSETRON HCL 4 MG/2ML IJ SOLN
INTRAMUSCULAR | Status: AC
  Filled 2017-09-17: qty 2

## 2017-09-17 MED ORDER — BUPIVACAINE-EPINEPHRINE (PF) 0.25% -1:200000 IJ SOLN
INTRAMUSCULAR | Status: DC | PRN
  Administered 2017-09-17: 25 mL via PERINEURAL
  Administered 2017-09-17: 5 mL via PERINEURAL

## 2017-09-17 MED ORDER — FENTANYL CITRATE (PF) 100 MCG/2ML IJ SOLN
INTRAMUSCULAR | Status: AC
  Filled 2017-09-17: qty 2

## 2017-09-17 MED ORDER — FENTANYL CITRATE (PF) 100 MCG/2ML IJ SOLN
INTRAMUSCULAR | Status: DC | PRN
  Administered 2017-09-17: 50 ug via INTRAVENOUS
  Administered 2017-09-17 (×2): 25 ug via INTRAVENOUS

## 2017-09-17 MED ORDER — SODIUM CHLORIDE 0.9 % IV SOLN
INTRAVENOUS | Status: DC
  Administered 2017-09-17: 16:00:00 via INTRAVENOUS

## 2017-09-17 MED ORDER — FAMOTIDINE 20 MG PO TABS
20.0000 mg | ORAL_TABLET | Freq: Once | ORAL | Status: AC
  Administered 2017-09-17: 20 mg via ORAL

## 2017-09-17 MED ORDER — MORPHINE SULFATE (PF) 4 MG/ML IV SOLN
INTRAVENOUS | Status: AC
  Filled 2017-09-17: qty 1

## 2017-09-17 MED ORDER — ONDANSETRON HCL 4 MG/2ML IJ SOLN
INTRAMUSCULAR | Status: DC | PRN
  Administered 2017-09-17: 4 mg via INTRAVENOUS

## 2017-09-17 MED ORDER — ACETAMINOPHEN 10 MG/ML IV SOLN
INTRAVENOUS | Status: AC
  Filled 2017-09-17: qty 100

## 2017-09-17 MED ORDER — ACETAMINOPHEN 10 MG/ML IV SOLN
INTRAVENOUS | Status: DC | PRN
  Administered 2017-09-17: 1000 mg via INTRAVENOUS

## 2017-09-17 MED ORDER — MORPHINE SULFATE 4 MG/ML IJ SOLN
INTRAMUSCULAR | Status: DC | PRN
  Administered 2017-09-17: 4 mg

## 2017-09-17 MED ORDER — PHENYLEPHRINE HCL 10 MG/ML IJ SOLN
INTRAMUSCULAR | Status: DC | PRN
  Administered 2017-09-17 (×4): 100 ug via INTRAVENOUS

## 2017-09-17 MED ORDER — DEXAMETHASONE SODIUM PHOSPHATE 10 MG/ML IJ SOLN
INTRAMUSCULAR | Status: AC
  Filled 2017-09-17: qty 1

## 2017-09-17 MED ORDER — ONDANSETRON HCL 4 MG/2ML IJ SOLN: 4.0000 mg | Freq: Once | INTRAMUSCULAR | Status: DC | PRN

## 2017-09-17 MED ORDER — MIDAZOLAM HCL 2 MG/2ML IJ SOLN
INTRAMUSCULAR | Status: DC | PRN
  Administered 2017-09-17: 2 mg via INTRAVENOUS

## 2017-09-17 MED ORDER — HYDROCODONE-ACETAMINOPHEN 5-325 MG PO TABS: 1.0000 | ORAL_TABLET | ORAL | 0 refills | Status: DC | PRN

## 2017-09-17 MED ORDER — FENTANYL CITRATE (PF) 100 MCG/2ML IJ SOLN
25.0000 ug | INTRAMUSCULAR | Status: DC | PRN
  Administered 2017-09-17: 25 ug via INTRAVENOUS

## 2017-09-17 MED ORDER — CHLORHEXIDINE GLUCONATE 4 % EX LIQD: 60.0000 mL | Freq: Once | CUTANEOUS | Status: DC

## 2017-09-17 MED ORDER — HYDROCODONE-ACETAMINOPHEN 5-325 MG PO TABS
ORAL_TABLET | ORAL | Status: AC
  Administered 2017-09-17: 1
  Filled 2017-09-17: qty 1

## 2017-09-17 MED ORDER — EPHEDRINE SULFATE 50 MG/ML IJ SOLN
INTRAMUSCULAR | Status: DC | PRN
  Administered 2017-09-17: 10 mg via INTRAVENOUS

## 2017-09-17 MED ORDER — FAMOTIDINE 20 MG PO TABS
ORAL_TABLET | ORAL | Status: AC
  Administered 2017-09-17: 20 mg via ORAL
  Filled 2017-09-17: qty 1

## 2017-09-17 MED ORDER — DEXAMETHASONE SODIUM PHOSPHATE 10 MG/ML IJ SOLN
INTRAMUSCULAR | Status: DC | PRN
  Administered 2017-09-17: 5 mg via INTRAVENOUS

## 2017-09-17 MED ORDER — EPHEDRINE SULFATE 50 MG/ML IJ SOLN
INTRAMUSCULAR | Status: AC
  Filled 2017-09-17: qty 1

## 2017-09-17 MED ORDER — LIDOCAINE HCL (PF) 2 % IJ SOLN
INTRAMUSCULAR | Status: AC
  Filled 2017-09-17: qty 10

## 2017-09-17 MED ORDER — BUPIVACAINE-EPINEPHRINE (PF) 0.25% -1:200000 IJ SOLN
INTRAMUSCULAR | Status: AC
  Filled 2017-09-17: qty 30

## 2017-09-17 MED ORDER — PROPOFOL 10 MG/ML IV BOLUS
INTRAVENOUS | Status: DC | PRN
Start: 2017-09-17 — End: 2017-09-17
  Administered 2017-09-17: 20 mg via INTRAVENOUS
  Administered 2017-09-17: 180 mg via INTRAVENOUS

## 2017-09-17 MED ORDER — CEFAZOLIN SODIUM-DEXTROSE 2-4 GM/100ML-% IV SOLN
INTRAVENOUS | Status: AC
  Filled 2017-09-17: qty 100

## 2017-09-17 MED ORDER — MIDAZOLAM HCL 2 MG/2ML IJ SOLN
INTRAMUSCULAR | Status: AC
  Filled 2017-09-17: qty 2

## 2017-09-17 SURGICAL SUPPLY — 29 items
ADAPTER IRRIG TUBE 2 SPIKE SOL (ADAPTER) ×4 IMPLANT
ADPR TBG 2 SPK PMP STRL ASCP (ADAPTER) ×2
BLADE SHAVER 4.5 DBL SERAT CV (CUTTER) IMPLANT
COOLER POLAR GLACIER W/PUMP (MISCELLANEOUS) ×2 IMPLANT
CUFF TOURN 24 STER (MISCELLANEOUS) IMPLANT
CUFF TOURN 30 STER DUAL PORT (MISCELLANEOUS) ×2 IMPLANT
DRSG DERMACEA 8X12 NADH (GAUZE/BANDAGES/DRESSINGS) ×2 IMPLANT
DURAPREP 26ML APPLICATOR (WOUND CARE) ×4 IMPLANT
GAUZE SPONGE 4X4 12PLY STRL (GAUZE/BANDAGES/DRESSINGS) ×2 IMPLANT
GLOVE BIOGEL M STRL SZ7.5 (GLOVE) ×2 IMPLANT
GLOVE BIOGEL PI IND STRL 7.5 (GLOVE) ×1 IMPLANT
GLOVE BIOGEL PI INDICATOR 7.5 (GLOVE) ×1
GLOVE INDICATOR 8.0 STRL GRN (GLOVE) ×2 IMPLANT
GOWN STRL REUS W/ TWL LRG LVL3 (GOWN DISPOSABLE) ×2 IMPLANT
GOWN STRL REUS W/TWL LRG LVL3 (GOWN DISPOSABLE) ×4
IV LACTATED RINGER IRRG 3000ML (IV SOLUTION) ×12
IV LR IRRIG 3000ML ARTHROMATIC (IV SOLUTION) ×6 IMPLANT
KIT RM TURNOVER STRD PROC AR (KITS) ×2 IMPLANT
MANIFOLD NEPTUNE II (INSTRUMENTS) ×2 IMPLANT
PACK ARTHROSCOPY KNEE (MISCELLANEOUS) ×2 IMPLANT
PAD WRAPON POLAR KNEE (MISCELLANEOUS) ×1 IMPLANT
SET TUBE SUCT SHAVER OUTFL 24K (TUBING) ×2 IMPLANT
SET TUBE TIP INTRA-ARTICULAR (MISCELLANEOUS) ×2 IMPLANT
SUT ETHILON 3-0 FS-10 30 BLK (SUTURE) ×2
SUTURE EHLN 3-0 FS-10 30 BLK (SUTURE) ×1 IMPLANT
TUBING ARTHRO INFLOW-ONLY STRL (TUBING) ×2 IMPLANT
WAND HAND CNTRL MULTIVAC 50 (MISCELLANEOUS) ×2 IMPLANT
WRAP KNEE W/COLD PACKS 25.5X14 (SOFTGOODS) IMPLANT
WRAPON POLAR PAD KNEE (MISCELLANEOUS) ×2

## 2017-09-17 NOTE — Anesthesia Postprocedure Evaluation (Signed)
Anesthesia Post Note  Patient: Chase Gibson  Procedure(s) Performed: ARTHROSCOPY KNEE, PATELLA FEMORAL  CHONDROPLASTY, PARTIAL MEDIAL MENISECTOMY (Left Knee)  Patient location during evaluation: PACU Anesthesia Type: General Level of consciousness: awake and alert and oriented Pain management: pain level controlled Vital Signs Assessment: post-procedure vital signs reviewed and stable Respiratory status: spontaneous breathing Cardiovascular status: blood pressure returned to baseline Anesthetic complications: no     Last Vitals:  Vitals:   09/17/17 1724 09/17/17 1740  BP: 118/67 119/75  Pulse: 70 70  Resp: 15 18  Temp:  36.5 C  SpO2: 97% 97%    Last Pain:  Vitals:   09/17/17 1729  TempSrc:   PainSc: 2                  [name]

## 2017-09-17 NOTE — H&P (Signed)
The patient has been re-examined, and the chart reviewed, and there have been no interval changes to the documented history and physical.    The risks, benefits, and alternatives have been discussed at length. The patient expressed understanding of the risks benefits and agreed with plans for surgical intervention.  James P. Hooten, Jr. M.D.    

## 2017-09-17 NOTE — Discharge Instructions (Addendum)
°  Instructions after Knee Arthroscopy  ° °- James P. Hooten, Jr., M.D.    ° Dept. of Orthopaedics & Sports Medicine ° [facility] ° [address] ° [place],   [address] ° ° Phone: [phone]   Fax: [phone] ° ° °DIET: °• Drink plenty of non-alcoholic fluids & begin a light diet. °• Resume your normal diet the day after surgery. ° °ACTIVITY:  °• You may use crutches or a walker with weight-bearing as tolerated, unless instructed otherwise. °• You may wean yourself off of the walker or crutches as tolerated.  °• Begin doing gentle exercises. Exercising will reduce the pain and swelling, increase motion, and prevent muscle weakness.   °• Avoid strenuous activities or athletics for a minimum of 4-6 weeks after arthroscopic surgery. °• Do not drive or operate any equipment until instructed. ° °WOUND CARE:  °• Place one to two pillows under the knee the first day or two when sitting or lying.  °• Continue to use the ice packs periodically to reduce pain and swelling. °• The small incisions in your knee are closed with nylon stitches. The stitches will be removed in the office. °• The bulky dressing may be removed on the second day after surgery. DO NOT TOUCH THE STITCHES. Put a Band-Aid over each stitch. Do NOT use any ointments or creams on the incisions.  °• You may bathe or shower after the stitches are removed at the first office visit following surgery. ° °MEDICATIONS: °• You may resume your regular medications. °• Please take the pain medication as prescribed. °• Do not take pain medication on an empty stomach. °• Do not drive or drink alcoholic beverages when taking pain medications. ° °CALL THE OFFICE FOR: °• Temperature above 101 degrees °• Excessive bleeding or drainage on the dressing. °• Excessive swelling, coldness, or paleness of the toes. °• Persistent nausea and vomiting. ° °FOLLOW-UP:  °• You should have an appointment to return to the office in 7-10 days after surgery.   ° ° °AMBULATORY SURGERY  °DISCHARGE INSTRUCTIONS ° ° °1) The drugs that you were given will stay in your system until tomorrow so for the next 24 hours you should not: ° °A) Drive an automobile °B) Make any legal decisions °C) Drink any alcoholic beverage ° ° °2) You may resume regular meals tomorrow.  Today it is better to start with liquids and gradually work up to solid foods. ° °You may eat anything you prefer, but it is better to start with liquids, then soup and crackers, and gradually work up to solid foods. ° ° °3) Please notify your doctor immediately if you have any unusual bleeding, trouble breathing, redness and pain at the surgery site, drainage, fever, or pain not relieved by medication. ° ° ° °4) Additional Instructions: ° ° ° ° ° ° ° °Please contact your physician with any problems or Same Day Surgery at [phone], Monday through Friday 6 am to 4 pm, or Mobile at [place] Main number at [phone]. ° °

## 2017-09-17 NOTE — Anesthesia Post-op Follow-up Note (Signed)
Anesthesia QCDR form completed.        

## 2017-09-17 NOTE — Anesthesia Procedure Notes (Signed)
Procedure Name: LMA Insertion Date/Time: 09/17/2017 3:52 PM Performed by: Jonna Clark, CRNA Pre-anesthesia Checklist: Patient identified, Patient being monitored, Timeout performed, Emergency Drugs available and Suction available Patient Re-evaluated:Patient Re-evaluated prior to induction Oxygen Delivery Method: Circle system utilized Preoxygenation: Pre-oxygenation with 100% oxygen Induction Type: IV induction Ventilation: Mask ventilation without difficulty LMA: LMA inserted LMA Size: 4.0 Tube type: Oral Number of attempts: 1 Placement Confirmation: positive ETCO2 and breath sounds checked- equal and bilateral Tube secured with: Tape Dental Injury: Teeth and Oropharynx as per pre-operative assessment

## 2017-09-17 NOTE — Anesthesia Preprocedure Evaluation (Signed)
Anesthesia Evaluation  Patient identified by MRN, date of birth, ID band Patient awake    Reviewed: Allergy & Precautions, NPO status , Patient's Chart, lab work & pertinent test results  Airway Mallampati: III  TM Distance: <3 FB     Dental  (+) Edentulous Upper, Edentulous Lower   Pulmonary shortness of breath, COPD, Current Smoker,    + rhonchi  + decreased breath sounds      Cardiovascular + CAD and + Past MI   Rate:Normal     Neuro/Psych Anxiety Depression    GI/Hepatic Neg liver ROS, GERD  ,  Endo/Other  diabetes, Type 2, Oral Hypoglycemic Agents  Renal/GU   negative genitourinary   Musculoskeletal  (+) Arthritis , Osteoarthritis,    Abdominal   Peds negative pediatric ROS (+)  Hematology  (+) anemia ,   Anesthesia Other Findings   Reproductive/Obstetrics                             Anesthesia Physical  Anesthesia Plan  ASA: III  Anesthesia Plan: General   Post-op Pain Management:    Induction: Intravenous  PONV Risk Score and Plan:   Airway Management Planned: LMA  Additional Equipment:   Intra-op Plan:   Post-operative Plan: Extubation in OR  Informed Consent: I have reviewed the patients History and Physical, chart, labs and discussed the procedure including the risks, benefits and alternatives for the proposed anesthesia with the patient or authorized representative who has indicated his/her understanding and acceptance.     Plan Discussed with: CRNA and Surgeon  Anesthesia Plan Comments:         Anesthesia Quick Evaluation

## 2017-09-17 NOTE — Transfer of Care (Signed)
Immediate Anesthesia Transfer of Care Note  Patient: Chase Gibson  Procedure(s) Performed: ARTHROSCOPY KNEE, PATELLA FEMORAL  CHONDROPLASTY, PARTIAL MEDIAL MENISECTOMY (Left Knee)  Patient Location: PACU  Anesthesia Type:General  Level of Consciousness: awake, alert  and oriented  Airway & Oxygen Therapy: Patient Spontanous Breathing and Patient connected to nasal cannula oxygen  Post-op Assessment: Report given to RN and Post -op Vital signs reviewed and stable  Post vital signs: Reviewed and stable  Last Vitals:  Vitals:   09/17/17 1504 09/17/17 1710  BP: 128/72 98/65  Pulse: 73 75  Resp: 18 17  Temp: 37.2 C   SpO2: 96%     Last Pain:  Vitals:   09/17/17 1504  TempSrc: Tympanic         Complications: No apparent anesthesia complications

## 2017-09-17 NOTE — Op Note (Signed)
OPERATIVE NOTE  DATE OF SURGERY:  09/17/2017  PATIENT NAME:  Chase Gibson   DOB: 17-Jan-1956  MRN: [id number]   PRE-OPERATIVE DIAGNOSIS:  Internal derangement of the left knee   POST-OPERATIVE DIAGNOSIS:   Tear of the posterior horn of the medial meniscus, left knee Grade II-III chondromalacia of the patellofemoral compartment, left knee  PROCEDURE:  Left knee arthroscopy, partial medial meniscectomy, and chondroplasty  SURGEON:  Marciano Sequin., M.D.   ASSISTANT: none  ANESTHESIA: general  ESTIMATED BLOOD LOSS: Minimal  FLUIDS REPLACED: 700 mL of crystalloid  TOURNIQUET TIME: Not used  DRAINS: none  IMPLANTS UTILIZED: None  INDICATIONS FOR SURGERY: Chase Gibson is a 61 y.o. year old male who has been seen for complaints of left knee pain.  Clinical findings were consistent with meniscal pathology. After discussion of the risks and benefits of surgical intervention, the patient expressed understanding of the risks benefits and agree with plans for left knee arthroscopy.   PROCEDURE IN DETAIL: The patient was brought into the operating room and, after adequate general anesthesia was achieved, a tourniquet was applied to the left thigh and the leg was placed in the leg holder. All bony prominences were well padded. The patient's left knee was cleaned and prepped with alcohol and Duraprep and draped in the usual sterile fashion. A "timeout" was performed as per usual protocol. The anticipated portal sites were injected with 0.25% Marcaine with epinephrine. An anterolateral incision was made and a cannula was inserted. A small effusion was evacuated and the knee was distended with fluid using the pump. The scope was advanced down the medial gutter into the medial compartment. Under visualization with the scope, an anteromedial portal was created and a hooked probe was inserted. The medial meniscus was visualized and probed.  There was degenerative tear involving the posterior  horn of the medial meniscus.  The tear was debrided using meniscal punches and a 4.5 mm incisor shaver.  Final contouring was performed using a 50 degree ArthroCare wand.  The remaining rim of meniscus was visualized and probed and felt to be stable.  The articular cartilage was visualized and noted to be in good condition.  The scope was then advanced into the intercondylar notch. The anterior cruciate ligament was visualized and probed and felt to be intact. The scope was removed from the lateral portal and reinserted via the anteromedial portal to better visualize the lateral compartment. The lateral meniscus was visualized and probed.  Lateral meniscus was intact without evidence of tear or instability. The articular cartilage of the lateral compartment was visualized and noted to be in good condition.  Finally, the scope was advanced so as to visualize the patellofemoral articulation. Good patellar tracking was appreciated.  There were grade 2-3 changes of chondromalacia involving the intercondylar groove.  The areas of chondromalacia were debrided and contoured using the ArthroCare wand.  The knee was irrigated with copius amounts of fluid and suctioned dry. The anterolateral portal was re-approximated with #3-0 nylon. A combination of 0.25% Marcaine with epinephrine and 4 mg of Morphine were injected via the scope. The scope was removed and the anteromedial portal was re-approximated with #3-0 nylon. A sterile dressing was applied followed by application of an ice wrap.  The patient tolerated the procedure well and was transported to the PACU in stable condition.  James P. Holley Bouche., M.D.

## 2017-09-18 ENCOUNTER — Encounter: Payer: Self-pay | Admitting: Orthopedic Surgery

## 2017-09-22 ENCOUNTER — Other Ambulatory Visit: Payer: Self-pay | Admitting: Neurology

## 2017-09-25 ENCOUNTER — Encounter: Payer: Self-pay | Admitting: Cardiology

## 2017-09-25 ENCOUNTER — Telehealth: Payer: Self-pay | Admitting: Neurology

## 2017-09-25 ENCOUNTER — Ambulatory Visit: Payer: Medicare Other | Admitting: Cardiology

## 2017-09-25 VITALS — BP 122/62 | HR 62 | Ht 70.0 in | Wt 208.0 lb

## 2017-09-25 DIAGNOSIS — F410 Panic disorder [episodic paroxysmal anxiety] without agoraphobia: Secondary | ICD-10-CM | POA: Diagnosis not present

## 2017-09-25 DIAGNOSIS — I214 Non-ST elevation (NSTEMI) myocardial infarction: Secondary | ICD-10-CM

## 2017-09-25 DIAGNOSIS — E785 Hyperlipidemia, unspecified: Secondary | ICD-10-CM | POA: Diagnosis not present

## 2017-09-25 DIAGNOSIS — Z9861 Coronary angioplasty status: Secondary | ICD-10-CM | POA: Diagnosis not present

## 2017-09-25 DIAGNOSIS — I2510 Atherosclerotic heart disease of native coronary artery without angina pectoris: Secondary | ICD-10-CM

## 2017-09-25 MED ORDER — PRIMIDONE 50 MG PO TABS: 50.0000 mg | ORAL_TABLET | Freq: Two times a day (BID) | ORAL | 0 refills | Status: DC

## 2017-09-25 MED ORDER — PAROXETINE HCL 20 MG PO TABS: 20.0000 mg | ORAL_TABLET | Freq: Every day | ORAL | 3 refills | Status: DC

## 2017-09-25 MED ORDER — CLOPIDOGREL BISULFATE 75 MG PO TABS: 75.0000 mg | ORAL_TABLET | Freq: Every day | ORAL | 3 refills | Status: DC

## 2017-09-25 MED ORDER — METOPROLOL SUCCINATE ER 25 MG PO TB24: 25.0000 mg | ORAL_TABLET | Freq: Every day | ORAL | 3 refills | Status: DC

## 2017-09-25 NOTE — Telephone Encounter (Signed)
Patient will need an appointment scheduled first. Can you please let him know? He doesn't have an appt scheduled. Thanks.

## 2017-09-25 NOTE — Telephone Encounter (Signed)
Patient called needing a refill on his Primidone medication.Thanks
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
## 2017-09-25 NOTE — Telephone Encounter (Signed)
RX sent to pharmacy to last til appt.  

## 2017-09-25 NOTE — Patient Instructions (Addendum)
MEDICATION INSTRUCTIONS  -- STOP BRILIINTA  -----START CLOPIDOGREL 75 MG ONE TABLET DAILY  -----STOP METOPROLOL TWICE A DAY -----START TOPROL XL  25 MG ONE TABLET DAILY.   -----START PAXIL 20 MG ONE TABLET DAILY.-- FOR PANIS DISORDER   Your physician wants you to follow-up in [place].You will receive a reminder letter in the mail two months in advance. If you don't receive a letter, please call our office to schedule the follow-up appointment.   If you need a refill on your cardiac medications before your next appointment, please call your pharmacy.

## 2017-09-25 NOTE — Telephone Encounter (Signed)
Yes I called the patient and he has just had knee surgery a week ago and is unable to come until 11/02/17. Will he be able to have his medication refilled until then? Please Advise. Thanks

## 2017-09-26 ENCOUNTER — Encounter: Payer: Self-pay | Admitting: Cardiology

## 2017-09-27 NOTE — Assessment & Plan Note (Addendum)
He remains on atorvastatin 80 mg daily times checked.  Due to have labs checked by PCP soon. --Low threshold for increasing treatment.  I have asked that his PCP please send labs to review.  Goal LDL should be less than 70 and closer to 50 --if not at goal, would probably need to consider adding Zetia versus Vascepa.  He should have a recheck lipid in 6 months if not checked now

## 2017-09-27 NOTE — Assessment & Plan Note (Signed)
He clearly is having almost panic disorder with generalized anxiety may be even some PTSD component.  He says the Xanax has helped him in the past, but no one is prescribing it.  He tends to self medicate but it scares me. I think the best option here is to treat the underlying cause and initial indication for Paxil other than for depression was for anxiety disorder.  Therefore I will start him on low-dose Paxil and refer him back to his psychiatrist or PCP to take on the mantle of treating this condition.

## 2017-09-27 NOTE — Assessment & Plan Note (Signed)
Minimal troponin elevation despite any significant LAD disease.  No significant regional wall motion is on echo with no further anginal symptoms or heart failure symptoms.    He is now far enough out where we can switch him to Plavix at this point and can actually consider switching to aspirin plus twice daily Xarelto in the future if cost will not be an issue.  Needs to stop smoking, but does not seem interested at this time. He is on low-dose metoprolol which is as much as he can tolerate. -Convert to Toprol 25 mg a day. Remains on high-dose atorvastatin.

## 2017-09-27 NOTE — Assessment & Plan Note (Signed)
Single-vessel LAD disease.  Doing well with no active angina symptoms.  It was a BMS that he had placed therefore probably does not need to be on Brilinta any longer.  Plan: Convert to Plavix without aspirin because it is an LAD lesion. High-dose statin.  Low-dose beta-blocker is most tolerated.  Also on ARB.

## 2017-10-12 ENCOUNTER — Other Ambulatory Visit: Payer: Self-pay | Admitting: Cardiology

## 2017-10-12 NOTE — Telephone Encounter (Signed)
Rx(s) sent to pharmacy electronically.  

## 2017-10-26 ENCOUNTER — Telehealth: Payer: Self-pay | Admitting: Neurology

## 2017-10-26 NOTE — Telephone Encounter (Signed)
Is he cancelling appt on 11-02-17? It is still scheduled. We called in enough medication to last until that appointment in December.

## 2017-10-26 NOTE — Telephone Encounter (Signed)
Pt called in regards to him running out of his prescription Primidone, pt had surgery and will not make his appointment so he wants to know if a prescription can be called in

## 2017-10-29 NOTE — Telephone Encounter (Signed)
Patient just had Knee Surgery and is unable to make the appt on 11/02/17. He cancelled but the next follow up is in April. He would like to know if he can have his Primidone refilled until that appointment. Thanks

## 2017-10-29 NOTE — Telephone Encounter (Signed)
Dr. Carles Collet- it looks like he called with the same reasoning in December to not make appointment. It looks like he had surgery in November. Please advise on refill.

## 2017-10-29 NOTE — Telephone Encounter (Signed)
Cannot fill.  It has been too long since being seen.  He was seen by cardiology in early December and note stated that he was well recovered from knee surgery.  Cannot provide medication without appointment

## 2017-10-29 NOTE — Telephone Encounter (Signed)
I called patient to inform him and he became very irate. He used explicit language and said he wanted a new neurologist. I tried to tell him to contact his primary provider for a referral elsewhere when he cursed at me again and hung up the phone. Dr. Carles Collet informed and patient dismissed from the practice.

## 2017-10-29 NOTE — Telephone Encounter (Signed)
See me

## 2017-10-29 NOTE — Telephone Encounter (Signed)
Patient had appt scheduled on 05/10/17 that he called and cancelled. It does not give a reason.   He called at the beginning of December for a refill of medication. He stated that he had surgery in November and wouldn't be recovered enough until January for appt. We sent enough meds until that appt. This is the appointment he just cancelled.  Please advise.

## 2017-10-31 ENCOUNTER — Encounter: Payer: Self-pay | Admitting: Neurology

## 2017-11-02 ENCOUNTER — Ambulatory Visit: Payer: Medicare Other | Admitting: Neurology

## 2017-11-02 ENCOUNTER — Telehealth: Payer: Self-pay | Admitting: Neurology

## 2017-11-02 NOTE — Telephone Encounter (Signed)
Patient dismissed from [facility] by Wells Guiles Tat DO , effective October 31, 2017. Dismissal letter sent out by certified / registered mail.  daj

## 2017-11-08 NOTE — Telephone Encounter (Signed)
Received signed domestic return receipt verifying delivery of certified letter on November 06, 2017. Article number [phone] [id number] [phone]

## 2017-12-13 ENCOUNTER — Ambulatory Visit: Payer: Medicare Other | Admitting: Neurology

## 2018-02-01 ENCOUNTER — Ambulatory Visit: Payer: Medicare Other | Admitting: Neurology

## 2018-02-08 IMAGING — CR DG CHEST 2V
2 series · 2 of 2 positions shown · non-contrast
Comparison: 02/16/2016

CLINICAL DATA: Cough and congestion for 2-3 weeks.

EXAM:
CHEST  2 VIEW

[view not recorded (1 of 2)]
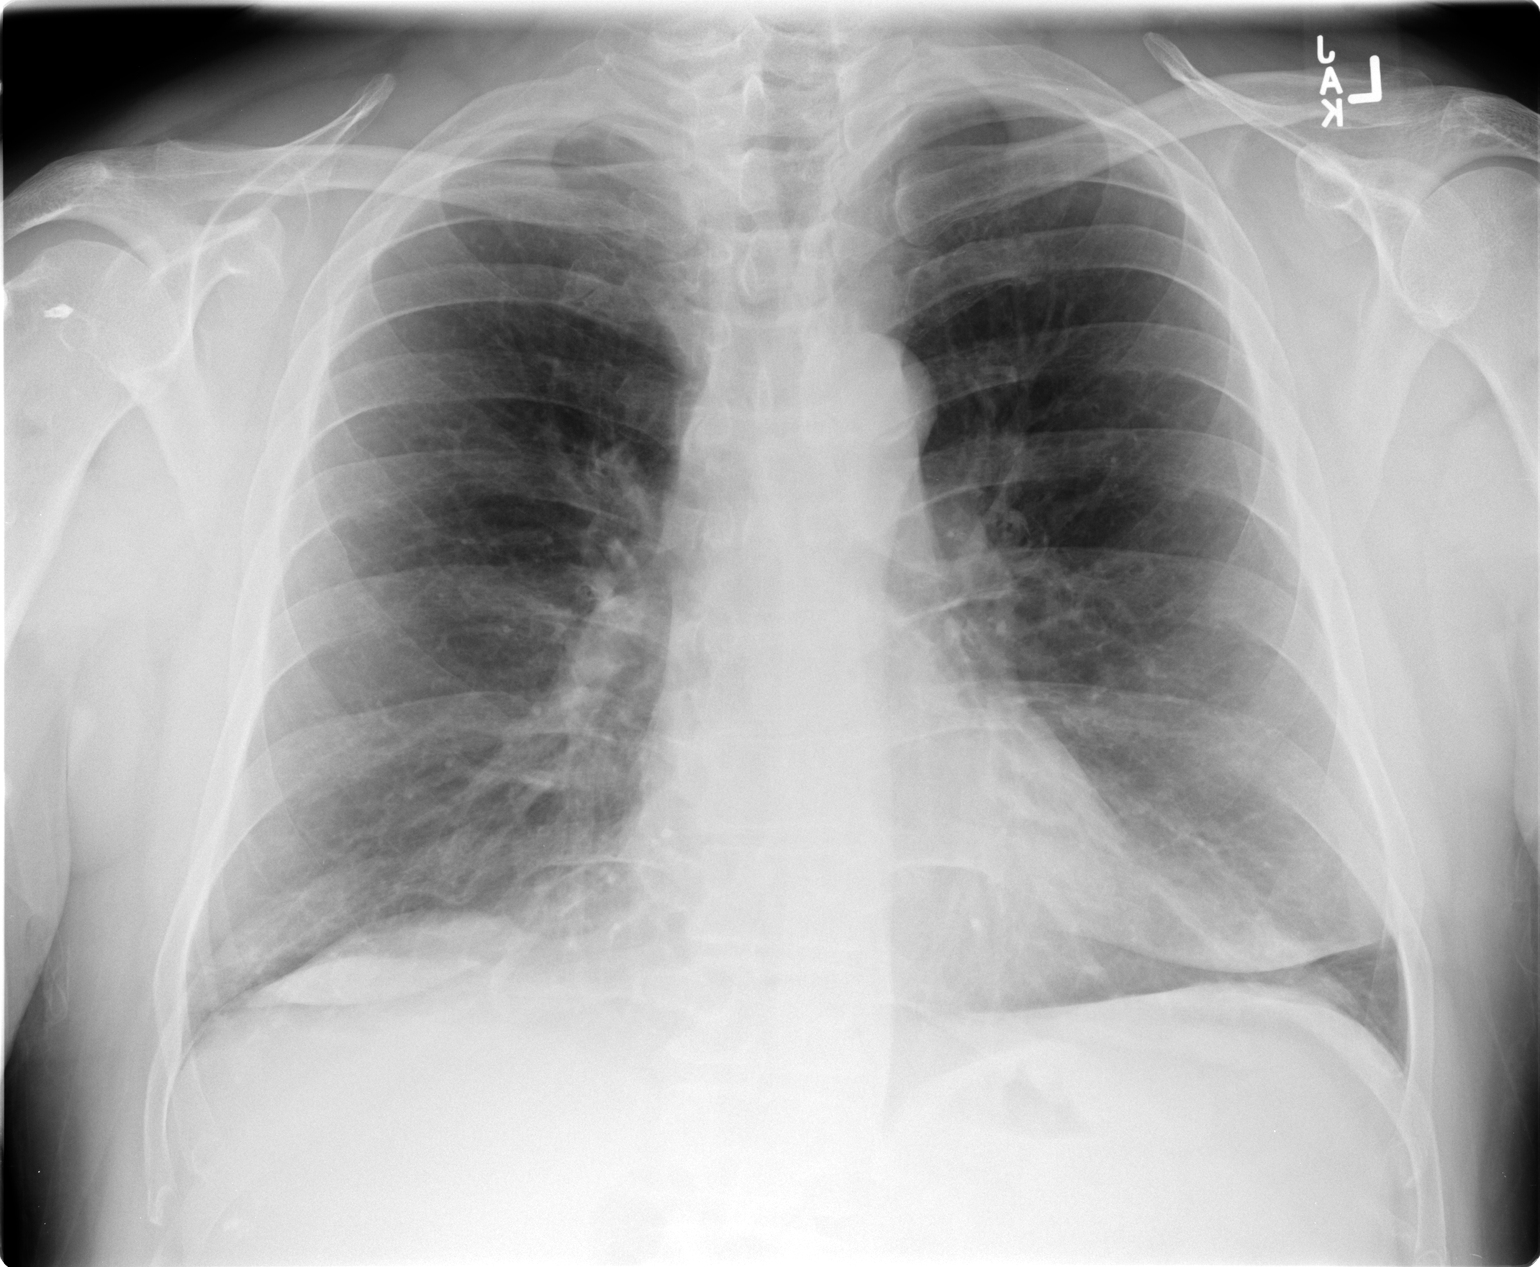

[view not recorded (2 of 2)]
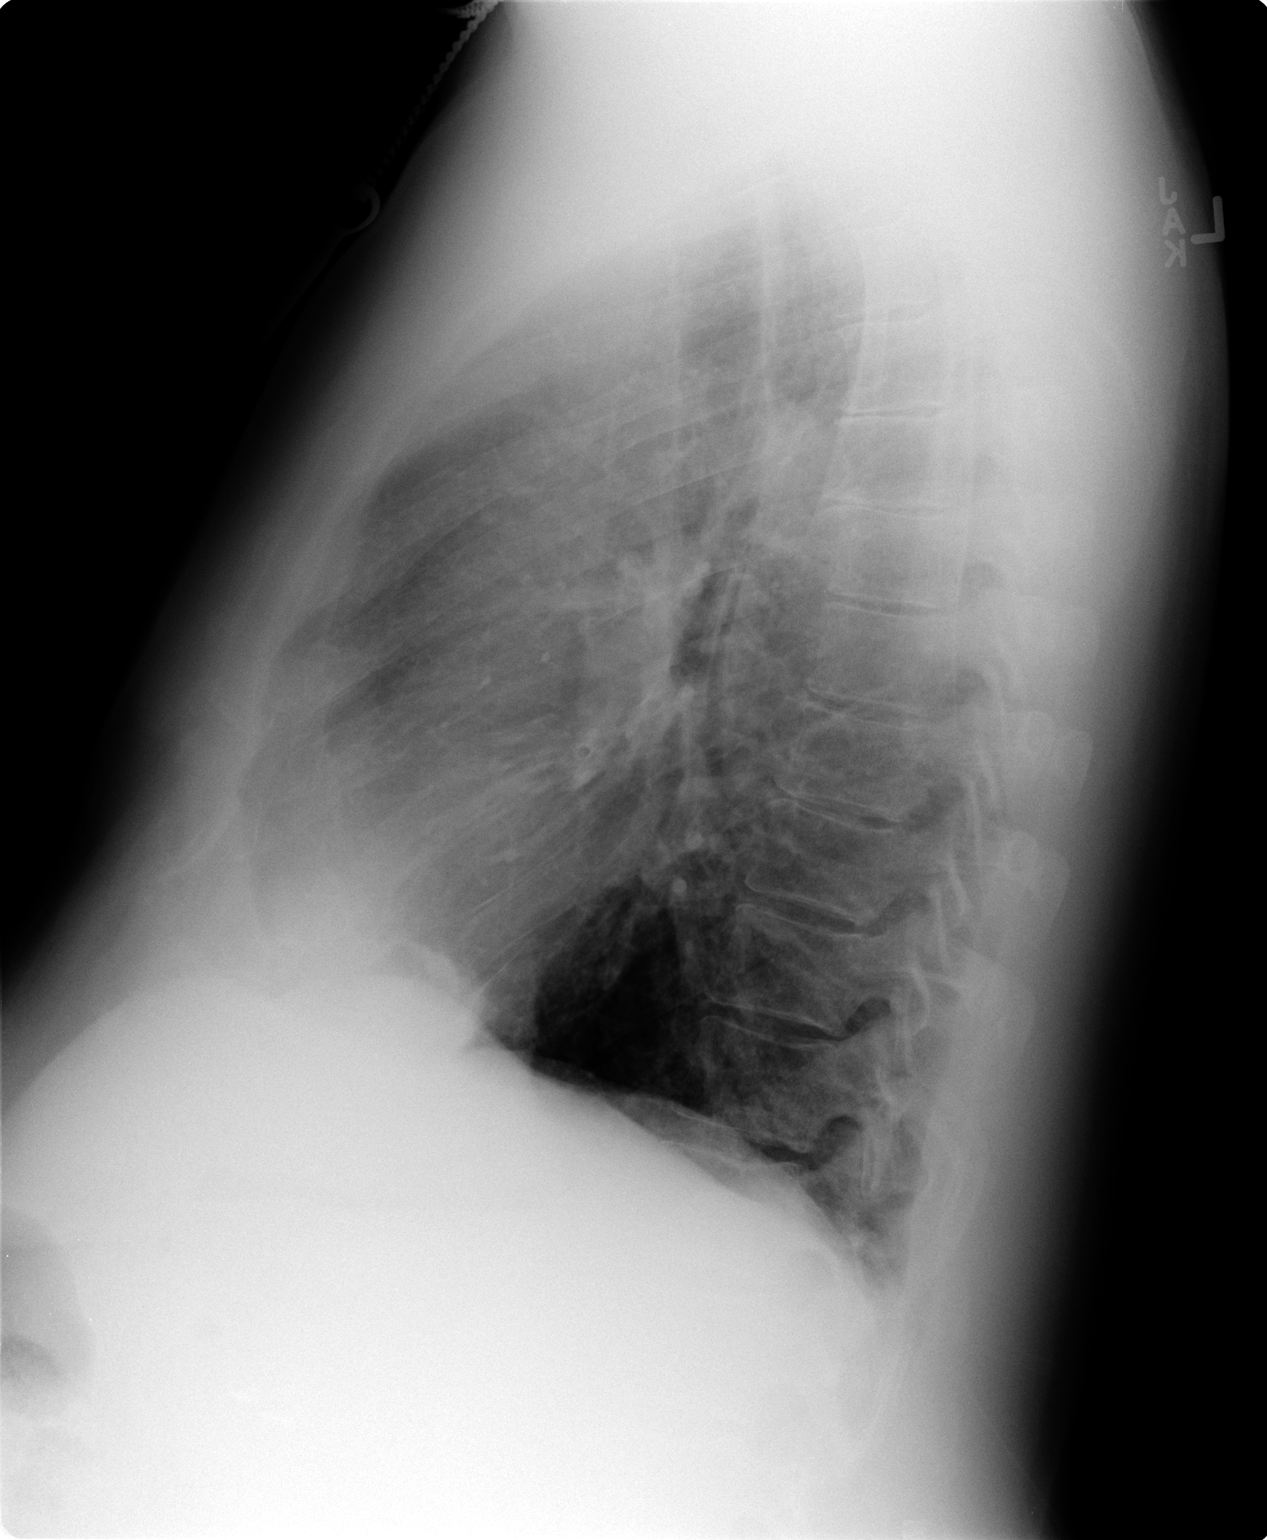

[2 of 2 positions shown; findings below may reference images not displayed]

FINDINGS: Cardiomediastinal silhouette is normal. Mediastinal contours appear
intact.

There is no evidence of focal airspace consolidation, pleural
effusion or pneumothorax.

Osseous structures are without acute abnormality. Soft tissues are
grossly normal.
IMPRESSION: No active cardiopulmonary disease.

## 2018-02-12 ENCOUNTER — Other Ambulatory Visit: Payer: Self-pay | Admitting: Family Medicine

## 2018-02-12 ENCOUNTER — Ambulatory Visit
Admission: RE | Admit: 2018-02-12 | Discharge: 2018-02-12 | Disposition: A | Discharge status: Home / Self Care | Payer: Medicare Other | Source: Ambulatory Visit | Attending: Family Medicine | Admitting: Family Medicine

## 2018-02-12 DIAGNOSIS — R059 Cough, unspecified: Secondary | ICD-10-CM

## 2018-02-12 DIAGNOSIS — R05 Cough: Secondary | ICD-10-CM

## 2018-08-13 ENCOUNTER — Emergency Department (HOSPITAL_COMMUNITY)
Admission: EM | Admit: 2018-08-13 | Discharge: 2018-08-14 | Disposition: A | Discharge status: Home / Self Care | Payer: Medicare Other | Attending: Emergency Medicine | Admitting: Emergency Medicine

## 2018-08-13 ENCOUNTER — Other Ambulatory Visit: Payer: Self-pay

## 2018-08-13 ENCOUNTER — Encounter (HOSPITAL_COMMUNITY): Payer: Self-pay | Admitting: Emergency Medicine

## 2018-08-13 DIAGNOSIS — E1165 Type 2 diabetes mellitus with hyperglycemia: Secondary | ICD-10-CM | POA: Diagnosis not present

## 2018-08-13 DIAGNOSIS — F17210 Nicotine dependence, cigarettes, uncomplicated: Secondary | ICD-10-CM | POA: Insufficient documentation

## 2018-08-13 DIAGNOSIS — Z96651 Presence of right artificial knee joint: Secondary | ICD-10-CM | POA: Diagnosis not present

## 2018-08-13 DIAGNOSIS — E1122 Type 2 diabetes mellitus with diabetic chronic kidney disease: Secondary | ICD-10-CM | POA: Insufficient documentation

## 2018-08-13 DIAGNOSIS — Z794 Long term (current) use of insulin: Secondary | ICD-10-CM | POA: Insufficient documentation

## 2018-08-13 DIAGNOSIS — Z7902 Long term (current) use of antithrombotics/antiplatelets: Secondary | ICD-10-CM | POA: Diagnosis not present

## 2018-08-13 DIAGNOSIS — I2510 Atherosclerotic heart disease of native coronary artery without angina pectoris: Secondary | ICD-10-CM | POA: Insufficient documentation

## 2018-08-13 DIAGNOSIS — J449 Chronic obstructive pulmonary disease, unspecified: Secondary | ICD-10-CM | POA: Diagnosis not present

## 2018-08-13 DIAGNOSIS — R739 Hyperglycemia, unspecified: Secondary | ICD-10-CM

## 2018-08-13 DIAGNOSIS — N183 Chronic kidney disease, stage 3 (moderate): Secondary | ICD-10-CM | POA: Diagnosis not present

## 2018-08-13 LAB — CBG MONITORING, ED: Glucose-Capillary: 522 mg/dL (ref 70–99)

## 2018-08-13 LAB — BASIC METABOLIC PANEL
Anion gap: 7 (ref 5–15)
BUN: 15 mg/dL (ref 8–23)
CHLORIDE: 97 mmol/L — AB (ref 98–111)
CO2: 24 mmol/L (ref 22–32)
CREATININE: 1.6 mg/dL — AB (ref 0.61–1.24)
Calcium: 8.8 mg/dL — ABNORMAL LOW (ref 8.9–10.3)
GFR calc Af Amer: 52 mL/min — ABNORMAL LOW (ref >60)
GFR calc non Af Amer: 45 mL/min — ABNORMAL LOW (ref >60)
GLUCOSE: 557 mg/dL — AB (ref 70–99)
Potassium: 5.2 mmol/L — ABNORMAL HIGH (ref 3.5–5.1)
Sodium: 128 mmol/L — ABNORMAL LOW (ref 135–145)

## 2018-08-13 LAB — CBC
HCT: 39.7 % (ref 39.0–52.0)
Hemoglobin: 13.2 g/dL (ref 13.0–17.0)
MCH: 28.3 pg (ref 26.0–34.0)
MCHC: 33.2 g/dL (ref 30.0–36.0)
MCV: 85.2 fL (ref 80.0–100.0)
Platelets: 178 10*3/uL (ref 150–400)
RBC: 4.66 MIL/uL (ref 4.22–5.81)
RDW: 13.7 % (ref 11.5–15.5)
WBC: 8.9 10*3/uL (ref 4.0–10.5)
nRBC: 0 % (ref 0.0–0.2)

## 2018-08-13 NOTE — ED Triage Notes (Addendum)
Patient c/o hyperglycemia x2 days. Reports CBG 543 today. Reports taking medications as prescribed. C/o nausea, denies V/D. Patient reports taking prednisone for 3 weeks to treat rash to left arm.

## 2018-08-14 ENCOUNTER — Emergency Department (HOSPITAL_COMMUNITY): Payer: Medicare Other

## 2018-08-14 LAB — CBG MONITORING, ED: Glucose-Capillary: 204 mg/dL — ABNORMAL HIGH (ref 70–99)

## 2018-08-14 LAB — URINALYSIS, ROUTINE W REFLEX MICROSCOPIC
BILIRUBIN URINE: NEGATIVE
Bacteria, UA: NONE SEEN
Hgb urine dipstick: NEGATIVE
Ketones, ur: NEGATIVE mg/dL
LEUKOCYTES UA: NEGATIVE
Nitrite: NEGATIVE
PH: 6 (ref 5.0–8.0)
PROTEIN: NEGATIVE mg/dL
Specific Gravity, Urine: 1.031 — ABNORMAL HIGH (ref 1.005–1.030)

## 2018-08-14 MED ORDER — INSULIN ASPART 100 UNIT/ML ~~LOC~~ SOLN
10.0000 [IU] | Freq: Once | SUBCUTANEOUS | Status: AC
  Administered 2018-08-14: 10 [IU] via INTRAVENOUS
  Filled 2018-08-14: qty 1

## 2018-08-14 MED ORDER — IPRATROPIUM-ALBUTEROL 0.5-2.5 (3) MG/3ML IN SOLN
3.0000 mL | Freq: Once | RESPIRATORY_TRACT | Status: AC
  Administered 2018-08-14: 3 mL via RESPIRATORY_TRACT
  Filled 2018-08-14: qty 3

## 2018-08-14 MED ORDER — SODIUM CHLORIDE 0.9 % IV BOLUS
1000.0000 mL | Freq: Once | INTRAVENOUS | Status: AC
  Administered 2018-08-14: 1000 mL via INTRAVENOUS

## 2018-08-14 NOTE — ED Provider Notes (Addendum)
[facility] DEPT Provider Note   CSN: [id number] Arrival date & time: 08/13/18  2031     History   Chief Complaint Chief Complaint  Patient presents with  . Hyperglycemia    HPI Chase Gibson is a 62 y.o. male.  Patient presents to the emergency department for evaluation of elevated blood sugar.  Patient has a history of insulin-dependent diabetes.  His doctor has been treating him for some type of rash on his arms for an extended period of time with prednisone.  Blood sugar has been difficult to manage for the patient over the last several days.  He comes in tonight with increased thirst and fatigue.  He has a history of asthma and recurrent bronchitis.  He reports that over the last couple of days he has had increased coughing and wheezing.  He reports that often when he gets "bronchitis" his blood sugars go up.     Past Medical History:  Diagnosis Date  . ALLERGIC RHINITIS 03/18/2009  . ANXIETY 06/24/2007   pt denies at PAT visit on 02/25/15  . ASTHMA 06/24/2007  . BREAST PAIN, LEFT 07/21/2008  . CAD S/P percutaneous coronary angioplasty 03/11/2015   SEVERE SINGLE VESSEL CAD OF THE PROX LAD - 95%--> 0% post PCI Vision BMS 3.5 x 18 (3.75 mm); MILD-MODERATE (~50%) NONOBSTRUCTIVE LCX AND RCA STENOSES   . CKD (chronic kidney disease), stage III ([place])   . Colon polyps 03/28/2012  . COPD (chronic obstructive pulmonary disease) ([place]) 03/28/2012  . DEPRESSION 06/24/2007  . DIABETES MELLITUS, TYPE II 07/21/2008  . GERD 08/25/2008   pt denies at PAT visit on 02/25/15  . History of non-ST elevation myocardial infarction (NSTEMI) 03/11/2015   Mild Troponin ~0.45, transient Anterior ST-T changes; LAD stenosis treated with BMS  . HYPERLIPIDEMIA 07/21/2008  . INSOMNIA-SLEEP DISORDER-UNSPEC 03/28/2010  . LOW BACK PAIN 09/03/2007  . Myocardial infarction ([place])   . NAUSEA 06/14/2010  . OSTEOARTHRITIS, KNEES, BILATERAL 08/25/2008  . PERIPHERAL EDEMA 07/21/2008  . SYMPTOM,  ABNORMAL INVOLUNTARY MOVEMENT NEC 06/21/2007  . Tobacco abuse   . Vertigo 11/2014   pt has a bout every 3-4 months, no pattern  . Wheezing 07/13/2009    Patient Active Problem List   Diagnosis Date Noted  . Panic disorder without agoraphobia 09/25/2017  . History of colonic polyps 04/18/2017  . Hyperosmolar non-ketotic state in patient with type 2 diabetes mellitus ([place]) 01/10/2016  . Hyperkalemia 01/10/2016  . Total knee replacement status 05/17/2015  . Non-ST elevation myocardial infarction (NSTEMI), subsequent episode of care ([place]) 03/11/2015  . CAD S/P LAD BMS 03/10/15 03/11/2015    Class: Diagnosis of  . DJD (degenerative joint disease) of knee 03/10/2015  . Acute renal failure superimposed on stage 3 chronic kidney disease ([place]) 03/10/2015  . Normochromic normocytic anemia   . Tobacco abuse   . Posttraumatic stress disorder 01/18/2015  . Back pain, acute 04/12/2012  . COPD/ pfts pending 03/28/2012  . Colon polyps 03/28/2012  . BRBPR (bright red blood per rectum) 03/28/2012  . COPD exacerbation ([place]) 03/28/2012  . Antiplatelet or antithrombotic long-term use 03/26/2011  . INSOMNIA-SLEEP DISORDER-UNSPEC 03/28/2010  . ALLERGIC RHINITIS 03/18/2009  . GERD 08/25/2008  . OSTEOARTHRITIS, KNEES, BILATERAL 08/25/2008  . IDDM (insulin dependent diabetes mellitus) ([place]) 07/21/2008  . Hyperlipidemia with target LDL less than 70 07/21/2008  . PERIPHERAL EDEMA 07/21/2008  . LOW BACK PAIN 09/03/2007  . Anxiety state 06/24/2007  . Depression 06/24/2007  . ASTHMA 06/24/2007  . SYMPTOM,  ABNORMAL INVOLUNTARY MOVEMENT NEC 06/21/2007    Past Surgical History:  Procedure Laterality Date  . APPENDECTOMY    . CARDIAC CATHETERIZATION N/A 03/10/2015   Procedure: Left Heart Cath and Coronary Angiography;  Surgeon: Sherren Mocha, MD;  Location: [facility];  Service: Cardiovascula: EVERE SINGLE VESSEL CAD OF THE PROX LAD - 95%--> BMS PCI. MILD-MODERATE (~50%) NONOBSTRUCTIVE LCX AND RCA  STENOSES   . CARDIAC CATHETERIZATION N/A 03/10/2015   Procedure: Coronary Stent Intervention;  Surgeon: Sherren Mocha, MD;  Location: [facility] INVASIVE CV LAB: PROX LAD - 95%--> 0% post PCI Vision BMS 3.5 x 18 (3.75 mm) - BMS used b/c upcoming knee Sgx.  . CHOLECYSTECTOMY    . inguinal herniorrhapy Bilateral    1979 & 1997  . KNEE ARTHROSCOPY Left    left  . KNEE ARTHROSCOPY Left 09/17/2017   Procedure: ARTHROSCOPY KNEE, PATELLA FEMORAL  CHONDROPLASTY, PARTIAL MEDIAL MENISECTOMY;  Surgeon: Dereck Leep, MD;  Location: [facility] ORS;  Service: Orthopedics;  Laterality: Left;  . Removal of right total knee implants for periprosthetic infection  10/02/2014  . Right knee arthrotomy, debridement, and reinsertion of antibiotic cement spacer  12/18/2014  . right knee surgury     x 9  . ROTATOR CUFF REPAIR Bilateral    bilateral  . TONSILLECTOMY    . TOTAL KNEE ARTHROPLASTY Right    right  . TOTAL KNEE ARTHROPLASTY Right 05/17/2015   Procedure: TOTAL KNEE ARTHROPLASTY, REMOVAL OF SPACER;  Surgeon: Dereck Leep, MD;  Location: [facility] OR[facility];  Service: Orthopedics;  Laterality: Right;  . TRANSTHORACIC ECHOCARDIOGRAM  03/10/2015   EF 65 and 70%. No RWMA. ~ normal diastolic parameters        Home Medications    Prior to Admission medications   Medication Sig Start Date End Date Taking? Authorizing Provider  albuterol (VENTOLIN HFA) 108 (90 BASE) MCG/ACT inhaler Inhale 1-2 puffs into the lungs every 4 (four) hours as needed for wheezing or shortness of breath.    Yes [provider]  ALPRAZolam (XANAX) 0.25 MG tablet Take 0.25 mg by mouth 2 (two) times daily as needed for anxiety.   Yes [provider]  atorvastatin (LIPITOR) 80 MG tablet TAKE 1 TABLET(80 MG) BY MOUTH DAILY AT 6 PM Patient taking differently: Take 80 mg by mouth daily.  10/12/17  Yes Leonie Man, MD  clopidogrel (PLAVIX) 75 MG tablet Take 1 tablet (75 mg total) by mouth daily. 09/25/17  Yes Leonie Man, MD    divalproex (DEPAKOTE ER) 500 MG 24 hr tablet Take 500 mg daily by mouth.    Yes [provider]  HYDROcodone-acetaminophen (NORCO) 10-325 MG per tablet Take 1 tablet by mouth every 6 (six) hours as needed for moderate pain. 03/28/12  Yes Biagio Borg, MD  hydrOXYzine (VISTARIL) 25 MG capsule Take 25 mg by mouth 3 (three) times daily as needed for itching.   Yes [provider]  Insulin Glargine (LANTUS) 100 UNIT/ML Solostar Pen Inject 10 Units into the skin daily. Patient taking differently: Inject 55 Units into the skin daily.  01/11/16  Yes Kelvin Cellar, MD  ipratropium-albuterol (DUONEB) 0.5-2.5 (3) MG/3ML SOLN Take 3 mLs every 6 (six) hours as needed by nebulization (for shortness of breath or wheezing).    Yes [provider]  losartan (COZAAR) 25 MG tablet Take 25 mg by mouth daily.  04/23/17  Yes [provider]  metoprolol succinate (TOPROL XL) 25 MG 24 hr tablet Take 1 tablet (25  mg total) by mouth daily. 09/25/17  Yes Leonie Man, MD  montelukast (SINGULAIR) 10 MG tablet Take 10 mg daily by mouth.  09/28/11  Yes Biagio Borg, MD  primidone (MYSOLINE) 50 MG tablet Take 1 tablet (50 mg total) by mouth 2 (two) times daily. Patient taking differently: Take 100 mg by mouth at bedtime.  09/25/17  Yes Tat, Eustace Quail, DO  QUEtiapine (SEROQUEL) 100 MG tablet Take 300 mg at bedtime by mouth.  11/19/15  Yes [provider]  sitaGLIPtin (JANUVIA) 50 MG tablet Take 50 mg by mouth daily.   Yes [provider]  HYDROcodone-acetaminophen (NORCO) 5-325 MG tablet Take 1-2 tablets by mouth every 4 (four) hours as needed for moderate pain. Patient not taking: Reported on 08/14/2018 09/17/17   Dereck Leep, MD  nitroGLYCERIN (NITROSTAT) 0.4 MG SL tablet Place 1 tablet (0.4 mg total) under the tongue every 5 (five) minutes as needed for chest pain. 03/12/15   Erlene Quan, PA-C  PARoxetine (PAXIL) 20 MG tablet Take 1 tablet (20 mg total) by mouth  daily. Patient not taking: Reported on 08/14/2018 09/25/17   Leonie Man, MD    Family History Family History  Problem Relation Age of Onset  . Coronary artery disease Other   . Emphysema Mother        never smoker but spouse smoked  . Allergies Son   . Asthma Maternal Grandmother   . Rheum arthritis Sister   . Lung cancer Father     Social History Social History   Tobacco Use  . Smoking status: Current Every Day Smoker    Packs/day: 1.00    Years: 35.00    Pack years: 35.00    Types: Cigarettes    Last attempt to quit: 12/02/2012    Years since quitting: 5.7  . Smokeless tobacco: Former Network engineer Use Topics  . Alcohol use: No  . Drug use: No     Allergies   Tramadol and Nsaids   Review of Systems Review of Systems  Constitutional: Positive for fatigue.  Respiratory: Positive for cough and wheezing.   Endocrine: Positive for polydipsia.  All other systems reviewed and are negative.    Physical Exam Updated Vital Signs BP 109/66   Pulse 71   Temp 98.4 F (36.9 C) (Oral)   Resp (!) 24   SpO2 96%   Physical Exam  Constitutional: He is oriented to person, place, and time. He appears well-developed and well-nourished. No distress.  HENT:  Head: Normocephalic and atraumatic.  Right Ear: Hearing normal.  Left Ear: Hearing normal.  Nose: Nose normal.  Mouth/Throat: Oropharynx is clear and moist and mucous membranes are normal.  Eyes: Pupils are equal, round, and reactive to light. Conjunctivae and EOM are normal.  Neck: Normal range of motion. Neck supple.  Cardiovascular: Regular rhythm, S1 normal and S2 normal. Exam reveals no gallop and no friction rub.  No murmur heard. Pulmonary/Chest: Effort normal. No respiratory distress. He has wheezes. He exhibits no tenderness.  Abdominal: Soft. Normal appearance and bowel sounds are normal. There is no hepatosplenomegaly. There is no tenderness. There is no rebound, no guarding, no tenderness at  McBurney's point and negative Murphy's sign. No hernia.  Musculoskeletal: Normal range of motion.  Neurological: He is alert and oriented to person, place, and time. He has normal strength. No cranial nerve deficit or sensory deficit. Coordination normal. GCS eye subscore is 4. GCS verbal subscore is 5. GCS motor subscore  is 6.  Skin: Skin is warm, dry and intact. No rash (Scaly erythematous patches and plaques bilateral arms) noted. No cyanosis.  Psychiatric: He has a normal mood and affect. His speech is normal and behavior is normal. Thought content normal.  Nursing note and vitals reviewed.    ED Treatments / Results  Labs (all labs ordered are listed, but only abnormal results are displayed) Labs Reviewed  BASIC METABOLIC PANEL - Abnormal; Notable for the following components:      Result Value   Sodium 128 (*)    Potassium 5.2 (*)    Chloride 97 (*)    Glucose, Bld 557 (*)    Creatinine, Ser 1.60 (*)    Calcium 8.8 (*)    GFR calc non Af Amer 45 (*)    GFR calc Af Amer 52 (*)    All other components within normal limits  CBG MONITORING, ED - Abnormal; Notable for the following components:   Glucose-Capillary 522 (*)    All other components within normal limits  CBG MONITORING, ED - Abnormal; Notable for the following components:   Glucose-Capillary 204 (*)    All other components within normal limits  CBC  URINALYSIS, ROUTINE W REFLEX MICROSCOPIC    EKG None  Radiology Dg Chest 2 View  Result Date: 08/14/2018 CLINICAL DATA:  Cough and wheezing EXAM: CHEST - 2 VIEW COMPARISON:  02/12/2018 FINDINGS: The heart size and mediastinal contours are within normal limits. Both lungs are clear. The visualized skeletal structures are unremarkable. IMPRESSION: No active cardiopulmonary disease. Electronically Signed   By: Ulyses Jarred M.D.   On: 08/14/2018 01:27    Procedures Procedures (including critical care time)  Medications Ordered in ED Medications  sodium chloride  0.9 % bolus 1,000 mL (0 mLs Intravenous Stopped 08/14/18 0112)  ipratropium-albuterol (DUONEB) 0.5-2.5 (3) MG/3ML nebulizer solution 3 mL (3 mLs Nebulization Given 08/14/18 0053)  insulin aspart (novoLOG) injection 10 Units (10 Units Intravenous Given 08/14/18 0051)     Initial Impression / Assessment and Plan / ED Course  I have reviewed the triage vital signs and the nursing notes.  Pertinent labs & imaging results that were available during my care of the patient were reviewed by me and considered in my medical decision making (see chart for details).     Patient presents to the emergency department for evaluation of elevated blood sugar.  It is felt that the patient's hyperglycemia secondary to prednisone use.  He does have cough and chest congestion, however, chest x-ray does not show evidence of pneumonia.  He does have some chronic lung disease, uses albuterol for bronchospasm.  He did have wheezing here, improved with DuoNeb.  Patient had IV fluids and IV insulin, significant improvement of his hyperglycemia.  No evidence of diabetic ketoacidosis.  Patient has finished his prednisone course.  He admits that he has been eating a lot of fruit and sugars lately, instructed on proper diet.  Follow-up with primary doctor for further instructions on insulin if hyperglycemia continues.  Final Clinical Impressions(s) / ED Diagnoses   Final diagnoses:  Hyperglycemia    ED Discharge Orders    None       Wallace Cogliano, Gwenyth Allegra, MD 08/14/18 0923    Orpah Greek, MD 08/14/18 [phone]

## 2018-10-02 ENCOUNTER — Other Ambulatory Visit: Payer: Self-pay | Admitting: Family Medicine

## 2018-10-02 DIAGNOSIS — J441 Chronic obstructive pulmonary disease with (acute) exacerbation: Secondary | ICD-10-CM

## 2018-10-03 ENCOUNTER — Ambulatory Visit
Admission: RE | Admit: 2018-10-03 | Discharge: 2018-10-03 | Disposition: A | Discharge status: Home / Self Care | Payer: Medicare Other | Source: Ambulatory Visit | Attending: Family Medicine | Admitting: Family Medicine

## 2018-10-03 ENCOUNTER — Other Ambulatory Visit: Payer: Self-pay | Admitting: Cardiology

## 2018-10-03 DIAGNOSIS — J441 Chronic obstructive pulmonary disease with (acute) exacerbation: Secondary | ICD-10-CM

## 2018-10-04 ENCOUNTER — Ambulatory Visit: Payer: Medicare Other | Admitting: Cardiology

## 2018-11-01 ENCOUNTER — Other Ambulatory Visit: Payer: Self-pay | Admitting: Family Medicine

## 2018-11-01 ENCOUNTER — Ambulatory Visit
Admission: RE | Admit: 2018-11-01 | Discharge: 2018-11-01 | Disposition: A | Discharge status: Home / Self Care | Payer: Medicare Other | Source: Ambulatory Visit | Attending: Family Medicine | Admitting: Family Medicine

## 2018-11-01 DIAGNOSIS — J189 Pneumonia, unspecified organism: Secondary | ICD-10-CM

## 2018-11-08 ENCOUNTER — Ambulatory Visit: Payer: Medicare Other | Admitting: Cardiology

## 2018-12-04 ENCOUNTER — Ambulatory Visit: Payer: Medicare Other | Admitting: Cardiology

## 2018-12-16 ENCOUNTER — Institutional Professional Consult (permissible substitution): Payer: Medicare Other | Admitting: Emergency Medicine

## 2018-12-18 ENCOUNTER — Other Ambulatory Visit: Payer: Self-pay | Admitting: Cardiology

## 2018-12-19 NOTE — Telephone Encounter (Signed)
Rx(s) sent to pharmacy electronically.  

## 2018-12-31 ENCOUNTER — Encounter: Payer: Self-pay | Admitting: Emergency Medicine

## 2018-12-31 ENCOUNTER — Ambulatory Visit: Payer: Medicare Other | Admitting: Emergency Medicine

## 2018-12-31 VITALS — BP 114/60 | HR 77 | Ht 70.0 in | Wt 170.8 lb

## 2018-12-31 DIAGNOSIS — J449 Chronic obstructive pulmonary disease, unspecified: Secondary | ICD-10-CM | POA: Diagnosis not present

## 2018-12-31 MED ORDER — PREDNISONE 10 MG PO TABS: ORAL_TABLET | ORAL | 0 refills | Status: DC

## 2018-12-31 MED ORDER — FLUTICASONE-UMECLIDIN-VILANT 100-62.5-25 MCG/INH IN AEPB: 1.0000 | INHALATION_SPRAY | Freq: Every day | RESPIRATORY_TRACT | 0 refills | Status: DC

## 2018-12-31 MED ORDER — AZITHROMYCIN 250 MG PO TABS: ORAL_TABLET | ORAL | 0 refills | Status: AC

## 2018-12-31 MED ORDER — FLUTICASONE PROPIONATE 50 MCG/ACT NA SUSP: 2.0000 | Freq: Every day | NASAL | 5 refills | Status: DC

## 2018-12-31 NOTE — Assessment & Plan Note (Signed)
He is having symptoms of an acute exacerbation including cough, scattered wheeze although based on his history not clear to me that some of these are subacute and persistent.  I will treat him for an acute flare now.  Try adding an ICS.  He may need Daliresp at some point based on his flare frequency.  Most important thing will be for him to stop smoking and I discussed this with him in detail today.  Please take prednisone as directed until completely gone. Please take azithromycin as directed until completely gone. Stop Anoro for now.  We will substitute Trelegy 1 inhalation once daily to see if you tolerate.  This may allow [place] to decrease the frequency with which your chronic bronchitis flares. We will order Ventolin through your pharmacy and then complete the prior authorization process see if we can get it substituted for pro-air.  Use 2 puffs up to every 4 hours if needed for shortness of breath, chest tightness, wheezing. You may benefit from medicine called Daliresp at some point in the future depending on how well we control your symptoms.  We can talk about this going forward. Most important thing for you to do is to stop smoking.  You should start by at least decreasing and then we can talk about strategies to quit altogether. Flu shot up-to-date Follow with Dr Lamonte Sakai in 1 month or next available with full PFT on the same day

## 2018-12-31 NOTE — Assessment & Plan Note (Signed)
Start fluticasone nasal spray, 2 sprays each nostril once daily.

## 2018-12-31 NOTE — Patient Instructions (Addendum)
Please take prednisone as directed until completely gone. Please take azithromycin as directed until completely gone. Stop Anoro for now.  We will substitute Trelegy 1 inhalation once daily to see if you tolerate.  This may allow [place] to decrease the frequency with which your chronic bronchitis flares. We will order Ventolin through your pharmacy and then complete the prior authorization process see if we can get it substituted for pro-air.  Use 2 puffs up to every 4 hours if needed for shortness of breath, chest tightness, wheezing. You may benefit from medicine called Daliresp at some point in the future depending on how well we control your symptoms.  We can talk about this going forward. Most important thing for you to do is to stop smoking.  You should start by at least decreasing and then we can talk about strategies to quit altogether. Flu shot up-to-date Start fluticasone nasal spray, 2 sprays each nostril once daily. Follow with Dr Lamonte Sakai in 1 month or next available with full PFT on the same day.

## 2018-12-31 NOTE — Progress Notes (Signed)
Subjective:    Patient ID: Chase Gibson, male    DOB: 09/28/1956, 63 y.o.   MRN: [id number]  HPI 63 year old active smoker (35 pack years) with a history of CAD/MI, diabetes, hypertension, CKD stage III, GERD, allergic rhinitis.  He carries a history of asthma that was made as a child.  He is referred today for evaluation of COPD.  He is on Anoro, has DuoNeb available to use as needed as well as Ventolin. Has been on Singulair for years. His sx include chronic bronchitis frequently - has been symptomatic over the Fall / Winter, had a few URI's, then dx with Flu B. He has a lot of cough, prod of clear phlegm at baseline. Hears wheeze at night, can wake him, associated w cough.  Uses DuoNeb prn, few times a week. He averages prednisone about 2-3x a year. He still smokes, has quit for 9 months once before.  He tells me that he believes that he is flaring currently and he does have cough, some scattered wheeze but not clear to me that this is not subacute and part of his daily symptomatology.   Review of Systems  Constitutional: Negative for fever and unexpected weight change.  HENT: Negative for congestion, dental problem, ear pain, nosebleeds, postnasal drip, rhinorrhea, sinus pressure, sneezing, sore throat and trouble swallowing.   Eyes: Negative for redness and itching.  Respiratory: Positive for cough and shortness of breath. Negative for chest tightness and wheezing.   Cardiovascular: Negative for palpitations and leg swelling.  Gastrointestinal: Negative for nausea and vomiting.  Genitourinary: Negative for dysuria.  Musculoskeletal: Negative for joint swelling.  Skin: Negative for rash.  Neurological: Negative for headaches.  Hematological: Does not bruise/bleed easily.  Psychiatric/Behavioral: The patient is nervous/anxious.     Past Medical History:  Diagnosis Date  . ALLERGIC RHINITIS 03/18/2009  . ANXIETY 06/24/2007   pt denies at PAT visit on 02/25/15  . ASTHMA 06/24/2007  .  BREAST PAIN, LEFT 07/21/2008  . CAD S/P percutaneous coronary angioplasty 03/11/2015   SEVERE SINGLE VESSEL CAD OF THE PROX LAD - 95%--> 0% post PCI Vision BMS 3.5 x 18 (3.75 mm); MILD-MODERATE (~50%) NONOBSTRUCTIVE LCX AND RCA STENOSES   . CKD (chronic kidney disease), stage III ([place])   . Colon polyps 03/28/2012  . COPD (chronic obstructive pulmonary disease) ([place]) 03/28/2012  . DEPRESSION 06/24/2007  . DIABETES MELLITUS, TYPE II 07/21/2008  . GERD 08/25/2008   pt denies at PAT visit on 02/25/15  . History of non-ST elevation myocardial infarction (NSTEMI) 03/11/2015   Mild Troponin ~0.45, transient Anterior ST-T changes; LAD stenosis treated with BMS  . HYPERLIPIDEMIA 07/21/2008  . INSOMNIA-SLEEP DISORDER-UNSPEC 03/28/2010  . LOW BACK PAIN 09/03/2007  . Myocardial infarction ([place])   . NAUSEA 06/14/2010  . OSTEOARTHRITIS, KNEES, BILATERAL 08/25/2008  . PERIPHERAL EDEMA 07/21/2008  . SYMPTOM, ABNORMAL INVOLUNTARY MOVEMENT NEC 06/21/2007  . Tobacco abuse   . Vertigo 11/2014   pt has a bout every 3-4 months, no pattern  . Wheezing 07/13/2009     Family History  Problem Relation Age of Onset  . Coronary artery disease Other   . Emphysema Mother        never smoker but spouse smoked  . Allergies Son   . Asthma Maternal Grandmother   . Rheum arthritis Sister   . Lung cancer Father      Social History   Socioeconomic History  . Marital status: Divorced    Spouse name: Not on file  .  Number of children: 2  . Years of education: Not on file  . Highest education level: Not on file  Occupational History  . Not on file  Social Needs  . Financial resource strain: Not on file  . Food insecurity:    Worry: Not on file    Inability: Not on file  . Transportation needs:    Medical: Not on file    Non-medical: Not on file  Tobacco Use  . Smoking status: Current Every Day Smoker    Packs/day: 1.00    Years: 35.00    Pack years: 35.00    Types: Cigarettes  . Smokeless tobacco: Former Dance movement psychotherapist and Sexual Activity  . Alcohol use: No  . Drug use: No  . Sexual activity: Not on file  Lifestyle  . Physical activity:    Days per week: Not on file    Minutes per session: Not on file  . Stress: Not on file  Relationships  . Social connections:    Talks on phone: Not on file    Gets together: Not on file    Attends religious service: Not on file    Active member of club or organization: Not on file    Attends meetings of clubs or organizations: Not on file    Relationship status: Not on file  . Intimate partner violence:    Fear of current or ex partner: Not on file    Emotionally abused: Not on file    Physically abused: Not on file    Forced sexual activity: Not on file  Other Topics Concern  . Not on file  Social History Narrative   Works as a Biochemist, clinical. Was actually not working at the time of his MI because of pain from his knee. He was scheduled to have right knee total replacement the week after his MI.   With long-term smoker, who quit following his MI.   He has a huge [employer] with tattoos  has lived in [place], [place], [place] Was in the [place] Reactions  . Tramadol Itching  . Nsaids Nausea And Vomiting and Rash     Outpatient Medications Prior to Visit  Medication Sig Dispense Refill  . albuterol (VENTOLIN HFA) 108 (90 BASE) MCG/ACT inhaler Inhale 1-2 puffs into the lungs every 4 (four) hours as needed for wheezing or shortness of breath.     Jearl Klinefelter ELLIPTA 62.5-25 MCG/INH AEPB Inhale 1 puff into the lungs daily.    Marland Kitchen atorvastatin (LIPITOR) 80 MG tablet Take 1 tablet (80 mg total) by mouth daily at 6 PM. 90 tablet 0  . clonazePAM (KLONOPIN) 0.5 MG tablet Take 0.5 mg by mouth daily.    . clopidogrel (PLAVIX) 75 MG tablet TAKE 1 TABLET(75 MG) BY MOUTH DAILY 90 tablet 0  . divalproex (DEPAKOTE ER) 500 MG 24 hr tablet Take 500 mg daily by mouth.     Marland Kitchen HYDROcodone-acetaminophen (NORCO) 10-325 MG per tablet Take 1 tablet by mouth  every 6 (six) hours as needed for moderate pain.    . hydrOXYzine (VISTARIL) 25 MG capsule Take 25 mg by mouth 3 (three) times daily as needed for itching.    . Insulin Glargine (LANTUS) 100 UNIT/ML Solostar Pen Inject 10 Units into the skin daily. (Patient taking differently: Inject 55 Units into the skin daily. ) 15 mL 11  . ipratropium-albuterol (DUONEB) 0.5-2.5 (3) MG/3ML SOLN Take 3 mLs every 6 (six) hours as  needed by nebulization (for shortness of breath or wheezing).     Marland Kitchen losartan (COZAAR) 25 MG tablet Take 25 mg by mouth daily.   1  . meclizine (ANTIVERT) 12.5 MG tablet Take 12.5 mg by mouth 3 (three) times daily as needed for dizziness.    . montelukast (SINGULAIR) 10 MG tablet Take 10 mg daily by mouth.     . nitroGLYCERIN (NITROSTAT) 0.4 MG SL tablet Place 1 tablet (0.4 mg total) under the tongue every 5 (five) minutes as needed for chest pain. 25 tablet 2  . primidone (MYSOLINE) 50 MG tablet Take 1 tablet (50 mg total) by mouth 2 (two) times daily. 60 tablet 0  . QUEtiapine (SEROQUEL) 100 MG tablet Take 300 mg at bedtime by mouth.   1  . sitaGLIPtin (JANUVIA) 50 MG tablet Take 50 mg by mouth daily.    Marland Kitchen ALPRAZolam (XANAX) 0.25 MG tablet Take 0.25 mg by mouth 2 (two) times daily as needed for anxiety.    Marland Kitchen HYDROcodone-acetaminophen (NORCO) 5-325 MG tablet Take 1-2 tablets by mouth every 4 (four) hours as needed for moderate pain. (Patient not taking: Reported on 08/14/2018) 20 tablet 0  . metoprolol succinate (TOPROL-XL) 25 MG 24 hr tablet TAKE 1 TABLET(25 MG) BY MOUTH DAILY 90 tablet 0  . PARoxetine (PAXIL) 20 MG tablet Take 1 tablet (20 mg total) by mouth daily. (Patient not taking: Reported on 08/14/2018) 90 tablet 3   Facility-Administered Medications Prior to Visit  Medication Dose Route Frequency Provider Last Rate Last Dose  . 0.9 %  sodium chloride infusion  500 mL Intravenous Continuous Doran Stabler, MD            Objective:   Physical Exam  Vitals:   12/31/18  0918  BP: 114/60  Pulse: 77  SpO2: 98%  Weight: 170 lb 12.8 oz (77.5 kg)  Height: 5\' 10"  (1.778 m)   Gen: Pleasant, well-nourished, in no distress,  normal affect, coughing frequently  ENT: No lesions,  mouth clear,  oropharynx clear, no postnasal drip  Neck: No JVD, no stridor  Lungs: No use of accessory muscles, he does have some scattered expiratory wheezes in both mid lungs  Cardiovascular: RRR, heart sounds normal, no murmur or gallops, no peripheral edema  Musculoskeletal: No deformities, no cyanosis or clubbing  Neuro: alert, awake, non focal  Skin: Warm, no lesions or rash      Assessment & Plan:  COPD He is having symptoms of an acute exacerbation including cough, scattered wheeze although based on his history not clear to me that some of these are subacute and persistent.  I will treat him for an acute flare now.  Try adding an ICS.  He may need Daliresp at some point based on his flare frequency.  Most important thing will be for him to stop smoking and I discussed this with him in detail today.  Please take prednisone as directed until completely gone. Please take azithromycin as directed until completely gone. Stop Anoro for now.  We will substitute Trelegy 1 inhalation once daily to see if you tolerate.  This may allow [place] to decrease the frequency with which your chronic bronchitis flares. We will order Ventolin through your pharmacy and then complete the prior authorization process see if we can get it substituted for pro-air.  Use 2 puffs up to every 4 hours if needed for shortness of breath, chest tightness, wheezing. You may benefit from medicine called Daliresp at some point in  the future depending on how well we control your symptoms.  We can talk about this going forward. Most important thing for you to do is to stop smoking.  You should start by at least decreasing and then we can talk about strategies to quit altogether. Flu shot up-to-date Follow with Dr  Lamonte Sakai in 1 month or next available with full PFT on the same day  Allergic rhinitis Start fluticasone nasal spray, 2 sprays each nostril once daily.   Baltazar Apo, MD, PhD 12/31/2018, 10:07 AM  Pulmonary and Critical Care [phone] or if no answer [phone]

## 2019-01-09 ENCOUNTER — Telehealth: Payer: Self-pay | Admitting: Emergency Medicine

## 2019-01-09 ENCOUNTER — Other Ambulatory Visit: Payer: Self-pay | Admitting: Emergency Medicine

## 2019-01-09 MED ORDER — PREDNISONE 10 MG PO TABS: ORAL_TABLET | ORAL | 0 refills | Status: DC

## 2019-01-09 MED ORDER — AZITHROMYCIN 250 MG PO TABS: ORAL_TABLET | ORAL | 0 refills | Status: DC

## 2019-01-09 NOTE — Telephone Encounter (Signed)
Let him know that the most important thing he can do at this time is decrease his smoking. I am willing to treat him with another round of prednisone and azithromycin as below, but I suspect that the sx he is experiencing are more sub-acute, may persist despite treating for a flare. We will have to discuss going forward.   Prednisone >> Take 40mg  daily for 3 days, then 30mg  daily for 3 days, then 20mg  daily for 3 days, then 10mg  daily for 3 days, then stop Azithromycin > Z-pack

## 2019-01-09 NOTE — Telephone Encounter (Signed)
Pt called in for a refill on Z-Pack. CB# [phone]

## 2019-01-09 NOTE — Telephone Encounter (Signed)
Called and spoke with patient, he stated that he finished the first pack of the prednisone but he still has the bronchitis. No fever no other symptoms. No recent travel. He was told he could have one other refill after finishing the first dose. Please advise, thank you.   Instructions   Please take prednisone as directed until completely gone. Please take azithromycin as directed until completely gone. Stop Anoro for now.  We will substitute Trelegy 1 inhalation once daily to see if you tolerate.  This may allow [place] to decrease the frequency with which your chronic bronchitis flares. We will order Ventolin through your pharmacy and then complete the prior authorization process see if we can get it substituted for pro-air.  Use 2 puffs up to every 4 hours if needed for shortness of breath, chest tightness, wheezing. You may benefit from medicine called Daliresp at some point in the future depending on how well we control your symptoms.  We can talk about this going forward. Most important thing for you to do is to stop smoking.  You should start by at least decreasing and then we can talk about strategies to quit altogether. Flu shot up-to-date Start fluticasone nasal spray, 2 sprays each nostril once daily. Follow with Dr Lamonte Sakai in 1 month or next available with full PFT on the same day.     ---------------------------------  RB please advise, thank you.

## 2019-01-09 NOTE — Telephone Encounter (Signed)
Called and spoke with pt stating to him the info per RB. Pt expressed understanding. Verified pt's preferred pharmacy and sent pred taper and zpak into pt's preferred pharmacy. Nothing further needed.

## 2019-01-21 ENCOUNTER — Telehealth: Payer: Self-pay | Admitting: Cardiology

## 2019-01-21 ENCOUNTER — Other Ambulatory Visit: Payer: Self-pay | Admitting: Cardiology

## 2019-01-21 NOTE — Telephone Encounter (Signed)
Supposed to be on Metoprolol XL 25 mg. Not Metoprolol tartrate.  Glenetta Hew, MD

## 2019-01-21 NOTE — Telephone Encounter (Signed)
Pt calling requesting a refill on metoprolol. This medication is no longer on pt's medication list. Please address

## 2019-01-22 MED ORDER — METOPROLOL SUCCINATE ER 25 MG PO TB24: 25.0000 mg | ORAL_TABLET | Freq: Every day | ORAL | 11 refills | Status: DC

## 2019-01-24 ENCOUNTER — Telehealth: Payer: Self-pay | Admitting: *Deleted

## 2019-01-24 NOTE — Telephone Encounter (Signed)
LMTCB- NEED SCHEDULE E-VISIT FOR 4/9 W/HARDING.Marland Kitchen

## 2019-01-27 ENCOUNTER — Telehealth: Payer: Self-pay

## 2019-01-27 NOTE — Telephone Encounter (Signed)
Left detailed voice message about switching to a E-visit for [facility] 01/30/19 at 9:40 AM

## 2019-01-27 NOTE — Telephone Encounter (Signed)
lmtcb  - ned schedule an e-visit  For 01/30/19 w/harding

## 2019-01-30 ENCOUNTER — Telehealth: Payer: Medicare Other | Admitting: Cardiology

## 2019-01-30 ENCOUNTER — Encounter

## 2019-01-31 ENCOUNTER — Telehealth: Payer: Self-pay | Admitting: Cardiology

## 2019-01-31 NOTE — Telephone Encounter (Signed)
UNABLE TO TALK TO PATIENT. LEFT DETAILED MESSAGE - PLEASE HAVE VITAL SIGNS ABLE AND MEDICATIONS  BE AVAILABLE  BTWN 1 PM AND 2 PM  DID NOT HAVE TO CALL BACK  WILL CONTACT 4/13.

## 2019-01-31 NOTE — Telephone Encounter (Signed)
LVM for pre reg. 01-31-19 ST

## 2019-01-31 NOTE — Telephone Encounter (Signed)
Smart phone/MyChart/pre reg complete. 01-31-19 ST Consent obtained on 01-31-19 for telephone visit. ST

## 2019-02-03 ENCOUNTER — Encounter: Payer: Self-pay | Admitting: Cardiology

## 2019-02-03 ENCOUNTER — Telehealth (INDEPENDENT_AMBULATORY_CARE_PROVIDER_SITE_OTHER): Payer: Medicare Other | Admitting: Cardiology

## 2019-02-03 ENCOUNTER — Telehealth: Payer: Self-pay | Admitting: *Deleted

## 2019-02-03 VITALS — Ht 70.0 in | Wt 180.0 lb

## 2019-02-03 DIAGNOSIS — F411 Generalized anxiety disorder: Secondary | ICD-10-CM | POA: Diagnosis not present

## 2019-02-03 DIAGNOSIS — I214 Non-ST elevation (NSTEMI) myocardial infarction: Secondary | ICD-10-CM | POA: Diagnosis not present

## 2019-02-03 DIAGNOSIS — I10 Essential (primary) hypertension: Secondary | ICD-10-CM | POA: Insufficient documentation

## 2019-02-03 DIAGNOSIS — Z720 Tobacco use: Secondary | ICD-10-CM

## 2019-02-03 DIAGNOSIS — I2510 Atherosclerotic heart disease of native coronary artery without angina pectoris: Secondary | ICD-10-CM

## 2019-02-03 DIAGNOSIS — E785 Hyperlipidemia, unspecified: Secondary | ICD-10-CM | POA: Diagnosis not present

## 2019-02-03 DIAGNOSIS — Z9861 Coronary angioplasty status: Secondary | ICD-10-CM

## 2019-02-03 MED ORDER — PROPRANOLOL HCL 20 MG PO TABS: 20.0000 mg | ORAL_TABLET | Freq: Two times a day (BID) | ORAL | 3 refills | Status: DC

## 2019-02-03 MED ORDER — ASPIRIN EC 81 MG PO TBEC: DELAYED_RELEASE_TABLET | ORAL | 3 refills | Status: DC

## 2019-02-03 MED ORDER — NITROGLYCERIN 0.4 MG SL SUBL: 0.4000 mg | SUBLINGUAL_TABLET | SUBLINGUAL | 6 refills | Status: AC | PRN

## 2019-02-03 MED ORDER — CLOPIDOGREL BISULFATE 75 MG PO TABS: 75.0000 mg | ORAL_TABLET | Freq: Every day | ORAL | 3 refills | Status: DC

## 2019-02-03 NOTE — Assessment & Plan Note (Addendum)
History of severe single-vessel LAD disease with BMS to the LAD back in 2016.  No further angina symptoms. Was switched back in 2018 to Plavix alone --> now with significant bruising, I think we can probably have him take alternating aspirin and Plavix every other day.  Okay to hold Plavix 5 to 7 days preop for any procedures or for significant bruising.  Remains on high-dose statin --okay to reduce dose based on lipid levels. He is on beta-blocker (converted to propranolol from Toprol) along with ARB. Continued smoking cessation

## 2019-02-03 NOTE — Assessment & Plan Note (Addendum)
Several years out from his non-STEMI.  No other significant disease noted other than the stented LAD. No further angina or heart failure symptoms  Still smoking, but partially interested in quitting.  Now on propranolol as opposed to Toprol along with his losartan and statin.  Switch to alternating aspirin 81 mg and Plavix now every other day.

## 2019-02-03 NOTE — Telephone Encounter (Signed)
Left message going over instruction of AVS.   AVS will be sent via mychart , any question may call back

## 2019-02-03 NOTE — Assessment & Plan Note (Addendum)
He indicated his PCP was wanting him to potentially reduce his dose to 40 mg atorvastatin. Recommendation would be to potentially reduce to 40 mg daily if LDL is at the goal of less than 70.  If not, would continue current dose.  Unfortunately, I do not have most recent labs from PCP office.

## 2019-02-03 NOTE — Assessment & Plan Note (Signed)
Doing well now with smoking cessation.  I think having his grandkids around much more frequently is helping out a lot with this as well.

## 2019-02-03 NOTE — Assessment & Plan Note (Signed)
Is now using Klonopin plus BuSpar.  Dosing adjusted by psychiatry and PCP.

## 2019-02-03 NOTE — Patient Instructions (Addendum)
Medication Instructions:  OK to stop Metoprolol Succinate (Toprol) & use Propranolol (Inderal) 20mg  one tablet twice a day.   (reduced dosing days of Plavix - Clopidogrel) Alternate every other day ASA 81 mg & Plavix 75 mg Take clopidogrel 75 mg every other day. Dr Ellyn Hack instructions.    OK to hold Clopidogrel for procedures or bad bruising - 5-7 days  NITROGLYCERIN WAS REFILLED  For cholesterol - continue to have labs followed by Dr. Moreen Fowler.  -The goal LDL (bad cholesterol goal is Less than 70) -- if you are at goal, OK to reduce the Cholesterol medicine (atorvastatin) to 1/2 tablet daily.  If you need a refill on your cardiac medications before your next appointment, please call your pharmacy.   Lab work:   Doctor, general practice followed by [employer].  If you have labs (blood work) drawn today and your tests are completely normal, you will receive your results only by: Marland Kitchen MyChart Message (if you have MyChart) OR . A paper copy in the mail If you have any lab test that is abnormal or we need to change your treatment, we will call you to review the results.  Testing/Procedures: NOT NEEDED  Follow-Up: At [facility], you and your health needs are our priority.  As part of our continuing mission to provide you with exceptional heart care, we have created designated Provider Care Teams.  These Care Teams include your primary Cardiologist (physician) and Advanced Practice Providers (APPs -  Physician Assistants and Nurse Practitioners) who all work together to provide you with the care you need, when you need it. You will need a follow up appointment in   12 MONTH - April 2021.  Please call our office 2 months in advance to schedule this appointment.  You may see Glenetta Hew, MD or one of the following Advanced Practice Providers on your designated Care Team:   Rosaria Ferries, PA-C . Jory Sims, DNP, ANP  Any Other Special Instructions Will Be Listed Below (If Applicable).  -- OK to  hold Clopidogrel for procedures or bad bruising - 5-7 days
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
## 2019-02-03 NOTE — Assessment & Plan Note (Addendum)
Blood pressure well controlled on propranolol and losartan.  Was converted by PCP to propranolol from Toprol

## 2019-03-20 ENCOUNTER — Other Ambulatory Visit: Payer: Self-pay

## 2019-03-20 ENCOUNTER — Ambulatory Visit (INDEPENDENT_AMBULATORY_CARE_PROVIDER_SITE_OTHER): Payer: Medicare Other | Admitting: Nurse Practitioner

## 2019-03-20 ENCOUNTER — Telehealth: Payer: Self-pay | Admitting: Emergency Medicine

## 2019-03-20 ENCOUNTER — Telehealth: Payer: Self-pay | Admitting: Nurse Practitioner

## 2019-03-20 DIAGNOSIS — J449 Chronic obstructive pulmonary disease, unspecified: Secondary | ICD-10-CM

## 2019-03-20 MED ORDER — ROFLUMILAST 250 MCG PO TABS: 1.0000 | ORAL_TABLET | Freq: Every day | ORAL | 0 refills | Status: DC

## 2019-03-20 NOTE — Telephone Encounter (Signed)
Called and spoke with pt letting him know that we would do the PA once received by pharmacy and stated that once we knew the outcome we could call him to let him know. Stated to pt that sometimes it could take up to 72 hours to receive the results but stated we would let him know as soon as we knew. Nothing further needed.

## 2019-03-20 NOTE — Progress Notes (Signed)
Virtual Visit via Telephone Note  I connected with Chase Gibson on 03/21/19 at  3:00 PM EDT by telephone and verified that I am speaking with the correct person using two identifiers.  Location: Patient: home Provider: office   I discussed the limitations, risks, security and privacy concerns of performing an evaluation and management service by telephone and the availability of in person appointments. I also discussed with the patient that there may be a patient responsible charge related to this service. The patient expressed understanding and agreed to proceed.   History of Present Illness: 63 year old male active smoker (35 pack years) COPD and allergic rhinitis who is followed by Dr. Lamonte Sakai. PMH includes CAD/MI, diabetes, hypertension, CKD stage III, GERD Maintenance: trelegy - previously on Anoro  Patient has a tele-visit today for on going shortness of breath.  His last visit with Dr. Lamonte Sakai on 12/31/2018 he was started on Trelegy.  He states that he did go back to [facility] because he could not tell any difference taking the Trelegy.  Compliant with Singulair.  Patient does use Flonase daily.  States that he does wheeze at night.  Unfortunately patient continues to smoke but is trying to quit.  He states that at his last visit with Dr. Lamonte Sakai they discussed starting [place].  Patient would like to try to start this medication to see if it helps. Denies f/c/s, n/v/d, hemoptysis, PND, leg swelling.    Observations/Objective:  CXR 11/01/18 - Chronic lung changes and evidence of emphysema without superimposed acute cardiopulmonary disease.   Assessment and Plan: COPD: Patient has a tele-visit today for on going shortness of breath.  His last visit with Dr. Lamonte Sakai on 12/31/2018 he was started on Trelegy.  He states that he did go back to [facility] because he could not tell any difference taking the Trelegy.  Compliant with Singulair.  Patient does use Flonase daily.  States that he does wheeze at  night.  Unfortunately patient continues to smoke but is trying to quit.  He states that at his last visit with Dr. Lamonte Sakai they discussed starting [place].  Patient would like to try to start this medication to see if it helps.   Patient Instructions  May continue Anoro - patient prefers this over trelegy  Continue singulair Continue duoneb and ventolin as needed Will trial daliresp Most important thing for you to do is to stop smoking.  You should start by at least decreasing and then we can talk about strategies to quit altogether. Continue fluticasone nasal spray, 2 sprays each nostril once daily.   Follow Up Instructions:  Follow with Dr Lamonte Sakai in 1-2 months through tele-visit or sooner if needed   I discussed the assessment and treatment plan with the patient. The patient was provided an opportunity to ask questions and all were answered. The patient agreed with the plan and demonstrated an understanding of the instructions.   The patient was advised to call back or seek an in-person evaluation if the symptoms worsen or if the condition fails to improve as anticipated.  I provided 23 minutes of non-face-to-face time during this encounter.   Fenton Foy, NP

## 2019-03-20 NOTE — Telephone Encounter (Signed)
Called pt to schedule COVID screen prior to PFT on 04/02/2019. Pt stated he did not want to do the PFT at this time, he does not want to leave his house at this time due to COVID risk. I advised pt of the precautions we are taking, pt still requested to not come in. Pt did request to talk to a provider about changing his medication. Pt last seen in 12/2018 by RB and was advised to change Anoro to Trelegy, sample was given but rx was not sent in to pharmacy. Pt states during the 4 weeks he was taking Trelegy he did not notice an improvement in his breathing and is requesting to speak with a provider about the possibility of starting a new medication to help his breathing. Per RB's last note, Daliresp was discussed, pt wants to speak with provider about starting this. Pt was offered telephone and video visit with APP, pt requested telephone visit. Appt made with Lazaro Arms, NP, on 03/21/2019. Pt verbalized understanding and denied any further questions or concerns at this time.    PFT and OV were not cancelled as he has an appt with a provider on 5/29 and may be willing to keep upcoming appts after televisit.

## 2019-03-21 ENCOUNTER — Ambulatory Visit: Payer: Medicare Other | Admitting: Nurse Practitioner

## 2019-03-21 ENCOUNTER — Telehealth: Payer: Self-pay | Admitting: Emergency Medicine

## 2019-03-21 ENCOUNTER — Encounter: Payer: Self-pay | Admitting: Nurse Practitioner

## 2019-03-21 NOTE — Assessment & Plan Note (Signed)
COPD: Patient has a tele-visit today for on going shortness of breath.  His last visit with Dr. Lamonte Sakai on 12/31/2018 he was started on Trelegy.  He states that he did go back to [facility] because he could not tell any difference taking the Trelegy.  Compliant with Singulair.  Patient does use Flonase daily.  States that he does wheeze at night.  Unfortunately patient continues to smoke but is trying to quit.  He states that at his last visit with Dr. Lamonte Sakai they discussed starting [place].  Patient would like to try to start this medication to see if it helps.   Patient Instructions  May continue Anoro - patient prefers this over trelegy  Continue singulair Continue duoneb and ventolin as needed Will trial daliresp Most important thing for you to do is to stop smoking.  You should start by at least decreasing and then we can talk about strategies to quit altogether. Continue fluticasone nasal spray, 2 sprays each nostril once daily. Follow with Dr Lamonte Sakai in 1-2 months through tele-visit or sooner if needed

## 2019-03-21 NOTE — Telephone Encounter (Signed)
Started PA via [email or web address]  Key is [id number]. Per CMM, [employer] will have a determination within 72 hours.

## 2019-03-21 NOTE — Telephone Encounter (Signed)
PA has been approved until 10/23/2019.   Spoke with pharmacist. She is aware and will order medication for the patient. Nothing further needed at time of call.

## 2019-03-21 NOTE — Patient Instructions (Addendum)
May continue Anoro - patient prefers this over trelegy  Continue singulair Continue duoneb and ventolin as needed Will trial daliresp Most important thing for you to do is to stop smoking.  You should start by at least decreasing and then we can talk about strategies to quit altogether. Continue fluticasone nasal spray, 2 sprays each nostril once daily. Follow with Dr Lamonte Sakai in 1-2 months through tele-visit or sooner if needed

## 2019-03-24 ENCOUNTER — Other Ambulatory Visit: Payer: Self-pay | Admitting: Cardiology

## 2019-04-02 ENCOUNTER — Ambulatory Visit: Payer: Medicare Other | Admitting: Pulmonary Disease

## 2019-04-17 ENCOUNTER — Ambulatory Visit (INDEPENDENT_AMBULATORY_CARE_PROVIDER_SITE_OTHER): Payer: Medicare Other | Admitting: Nurse Practitioner

## 2019-04-17 ENCOUNTER — Encounter: Payer: Self-pay | Admitting: Nurse Practitioner

## 2019-04-17 ENCOUNTER — Other Ambulatory Visit: Payer: Self-pay | Admitting: Nurse Practitioner

## 2019-04-17 ENCOUNTER — Telehealth: Payer: Self-pay | Admitting: Emergency Medicine

## 2019-04-17 ENCOUNTER — Other Ambulatory Visit: Payer: Self-pay

## 2019-04-17 DIAGNOSIS — J449 Chronic obstructive pulmonary disease, unspecified: Secondary | ICD-10-CM | POA: Diagnosis not present

## 2019-04-17 DIAGNOSIS — R6889 Other general symptoms and signs: Secondary | ICD-10-CM

## 2019-04-17 MED ORDER — TRELEGY ELLIPTA 100-62.5-25 MCG/INH IN AEPB: 1.0000 | INHALATION_SPRAY | Freq: Every day | RESPIRATORY_TRACT | 0 refills | Status: DC

## 2019-04-17 MED ORDER — NICOTINE 14 MG/24HR TD PT24: 14.0000 mg | MEDICATED_PATCH | Freq: Every day | TRANSDERMAL | 0 refills | Status: DC

## 2019-04-17 MED ORDER — ROFLUMILAST 500 MCG PO TABS: 500.0000 ug | ORAL_TABLET | Freq: Every day | ORAL | 11 refills | Status: DC

## 2019-04-17 MED ORDER — PREDNISONE 10 MG PO TABS: ORAL_TABLET | ORAL | 0 refills | Status: DC

## 2019-04-17 NOTE — Assessment & Plan Note (Addendum)
Patient has a tele-visit today for follow-up.  He was started on Daliresp 250 mg at his last visit on 03/20/2019.  Patient had ran out of his Trelegy samples and had went back to Anoro.  He states that he would like to try Trelegy again.  Unfortunately patient continues to smoke around a pack of cigarettes per day.  He states that he has 4 packs of cigarettes left and does not plan to buy anymore after he finishes these.  Does want to quit smoking.  He is requesting a prescription for nicotine patches.  States that he does still have ongoing shortness of breath.  He does get short of breath with exertion.  He is compliant with Singulair and Flonase.   Patient Instructions  Will order daliresp 500 mg daily Will order prednisone taper Will give trelegy samples  Most important thing for you to do is to stop smoking. You should start by at least decreasing and then we can talk about strategies to quit altogether. Will order nicotene patches  Continue fluticasone nasal spray, 2sprays each nostril once daily.  Follow with Dr Lamonte Sakai in 1 monththrough tele-visit or sooner if needed

## 2019-04-17 NOTE — Progress Notes (Signed)
Virtual Visit via Telephone Note  I connected with Chase Gibson Bias on 04/17/19 at  1:30 PM EDT by telephone and verified that I am speaking with the correct person using two identifiers.  Location: Patient: home Provider: office   I discussed the limitations, risks, security and privacy concerns of performing an evaluation and management service by telephone and the availability of in person appointments. I also discussed with the patient that there may be a patient responsible charge related to this service. The patient expressed understanding and agreed to proceed.   History of Present Illness: 63 year old male active smoker (35 pack years) with COPD and allergic rhinitis who is followed by Dr. Lamonte Sakai PMH includes CAD/MI, diabetes, hypertension, CKD stage III, GERD Maintenance: trelegy - previously on Anoro  Patient has a tele-visit today for follow-up.  He was started on Daliresp 250 mg at his last visit on 03/20/2019.  Patient had ran out of his Trelegy samples and had went back to Anoro.  He states that he would like to try Trelegy again.  Unfortunately patient continues to smoke around a pack of cigarettes per day.  He states that he has 4 packs of cigarettes left and does not plan to buy anymore after he finishes these.  Does want to quit smoking.  He is requesting a prescription for nicotine patches.  States that he does still have ongoing shortness of breath.  He does get short of breath with exertion.  He is compliant with Singulair and Flonase. Denies f/c/s, n/v/d, hemoptysis, PND, leg swelling.      Observations/Objective: CXR 11/01/18 - Chronic lung changes and evidence of emphysema without superimposed acute cardiopulmonary disease.    Assessment and Plan: Patient has a tele-visit today for follow-up.  He was started on Daliresp 250 mg at his last visit on 03/20/2019.  Patient had ran out of his Trelegy samples and had went back to Anoro.  He states that he would like to try  Trelegy again.  Unfortunately patient continues to smoke around a pack of cigarettes per day.  He states that he has 4 packs of cigarettes left and does not plan to buy anymore after he finishes these.  Does want to quit smoking.  He is requesting a prescription for nicotine patches.  States that he does still have ongoing shortness of breath.  He does get short of breath with exertion.  He is compliant with Singulair and Flonase.   Patient Instructions  Will order daliresp 500 mg daily Will order prednisone taper Will give trelegy samples  Most important thing for you to do is to stop smoking. You should start by at least decreasing and then we can talk about strategies to quit altogether. Will order nicotene patches  Continue fluticasone nasal spray, 2sprays each nostril once daily.    Follow Up Instructions:  Follow with Dr Lamonte Sakai in 1 monththrough tele-visit or sooner if needed       I discussed the assessment and treatment plan with the patient. The patient was provided an opportunity to ask questions and all were answered. The patient agreed with the plan and demonstrated an understanding of the instructions.   The patient was advised to call back or seek an in-person evaluation if the symptoms worsen or if the condition fails to improve as anticipated.  I provided 22 minutes of non-face-to-face time during this encounter.   Fenton Foy, NP

## 2019-04-17 NOTE — Patient Instructions (Addendum)
Will order daliresp 500 mg daily Will order prednisone taper Will give trelegy samples  Most important thing for you to do is to stop smoking. You should start by at least decreasing and then we can talk about strategies to quit altogether. Will order nicotene patches  Continue fluticasone nasal spray, 2sprays each nostril once daily.  Follow with Dr Lamonte Sakai in 1 monththrough tele-visit or sooner if needed

## 2019-04-17 NOTE — Telephone Encounter (Signed)
Called and spoke with pt letting him know that a PA cannot be done for nicotine patches as they are avail OTC. Pt verbalized understanding.  While speaking with pt, pt wanted to know if he could have COVID antibody test performed due to the fact that he had the flu back in February and wanted to know if he possibly could have had COVID as well at that time.  Dr. Lamonte Sakai, please advise if you are fine with [place] ordering the COVID antibody test for pt. Thank you!

## 2019-04-18 ENCOUNTER — Telehealth: Payer: Self-pay | Admitting: Nurse Practitioner

## 2019-04-18 MED ORDER — ROFLUMILAST 500 MCG PO TABS: 500.0000 ug | ORAL_TABLET | Freq: Every day | ORAL | 3 refills | Status: AC

## 2019-04-18 NOTE — Telephone Encounter (Signed)
Still awaiting response from Dr. Lamonte Sakai to see if he is fine for [place] ordering COVID antibody test for pt to have perfomred.

## 2019-04-18 NOTE — Telephone Encounter (Signed)
Instructions    Return in about 4 weeks (around 05/15/2019) for follow up. Will order daliresp 500 mg daily Will order prednisone taper Will give trelegy samples  Most important thing for you to do is to stop smoking. You should start by at least decreasing and then we can talk about strategies to quit altogether. Will order nicotene patches  Continuefluticasone nasal spray, 2sprays each nostril once daily.  Follow with Dr Lamonte Sakai in1 monththrough tele-visit or sooner if needed     Called and spoke with pt asking him if he picked up the Rx that was ready of the [facility] and pt stated he had not since it was not a 90-day supply. I stated to pt that I would call pharmacy to get this changed and pt verbalized understanding.  Called pt's pharmacy and spoke with the pharmacist telling them that we needed to change pt's Daliresp Rx to a 90 day supply and then have 3 refills on there for pt. Pharmacist verbalized understanding and stated they would fix this for pt. Nothing further needed.

## 2019-04-18 NOTE — Telephone Encounter (Signed)
Spoke with patient. He is aware that I have ordered the antibodies test. Advised him of the times to come into the office for the [facility], he verbalized understanding. Nothing further needed at time of call.

## 2019-04-18 NOTE — Telephone Encounter (Signed)
Yes Ok to order the Ab test

## 2019-04-28 ENCOUNTER — Other Ambulatory Visit: Payer: Medicare Other

## 2019-04-28 DIAGNOSIS — R6889 Other general symptoms and signs: Secondary | ICD-10-CM

## 2019-04-30 LAB — SARS-COV-2 ANTIBODY, IGM: SARS-CoV-2 Antibody, IgM: NEGATIVE

## 2019-05-05 ENCOUNTER — Telehealth: Payer: Self-pay | Admitting: Emergency Medicine

## 2019-05-05 NOTE — Telephone Encounter (Signed)
Called and spoke with pt letting him know the results of the covid antibody test. Pt verbalized understanding. Nothing further needed.

## 2019-06-09 ENCOUNTER — Telehealth: Payer: Self-pay | Admitting: Cardiology

## 2019-06-09 NOTE — Telephone Encounter (Signed)
New message    Patient calling to report left arm pain. No other symptoms  Requesting EKG

## 2019-06-09 NOTE — Telephone Encounter (Signed)
Spoke with pt, 4-5 years ago the patient had a MI and the only symptom he had was left arm pain between bicep and elbow. For the last 3-4 days he has again noticed left arm pain in the same area. He reports it is a dull pain with occasional sharp stabbing pains. He has put icy hot and pain patch on the area and it does help some but it does not go away. He is concerned because he has not had an ekg in some time. Follow up scheduled with dr harding this week. Patient voiced understanding to call or go to the ER if symptoms worsen or change prior to appointment.

## 2019-06-12 ENCOUNTER — Ambulatory Visit: Payer: Medicare Other | Admitting: Cardiology

## 2019-07-14 ENCOUNTER — Ambulatory Visit: Payer: Medicare Other | Admitting: Cardiology

## 2020-02-02 ENCOUNTER — Telehealth: Payer: Self-pay | Admitting: Emergency Medicine

## 2020-02-02 NOTE — Telephone Encounter (Signed)
Will hold encounter in triage until 4/13 to do PA with it being after 5pm.

## 2020-02-03 NOTE — Telephone Encounter (Signed)
Spoke with patient. He is aware that I have started the PA. I advised him that as soon as the PA has been approved, someone will call him and the pharmacy to let them know. He verbalized understanding.   Key: [id number]  PA was approved by the time I typed the above message. It has been approved until 10/22/2020. [facility] is aware of the approval. Patient is aware of the approval.   Nothing further needed at time of call.

## 2020-02-24 ENCOUNTER — Ambulatory Visit: Payer: Medicare Other | Admitting: Cardiology

## 2020-02-24 ENCOUNTER — Other Ambulatory Visit: Payer: Self-pay

## 2020-02-24 ENCOUNTER — Encounter: Payer: Self-pay | Admitting: Cardiology

## 2020-02-24 VITALS — BP 101/64 | HR 65 | Ht 70.0 in | Wt 173.4 lb

## 2020-02-24 DIAGNOSIS — I2510 Atherosclerotic heart disease of native coronary artery without angina pectoris: Secondary | ICD-10-CM | POA: Diagnosis not present

## 2020-02-24 DIAGNOSIS — I10 Essential (primary) hypertension: Secondary | ICD-10-CM

## 2020-02-24 DIAGNOSIS — Z9861 Coronary angioplasty status: Secondary | ICD-10-CM

## 2020-02-24 DIAGNOSIS — F410 Panic disorder [episodic paroxysmal anxiety] without agoraphobia: Secondary | ICD-10-CM

## 2020-02-24 DIAGNOSIS — E785 Hyperlipidemia, unspecified: Secondary | ICD-10-CM | POA: Diagnosis not present

## 2020-02-24 DIAGNOSIS — I214 Non-ST elevation (NSTEMI) myocardial infarction: Secondary | ICD-10-CM | POA: Diagnosis not present

## 2020-02-24 MED ORDER — ASPIRIN EC 81 MG PO TBEC: 81.0000 mg | DELAYED_RELEASE_TABLET | Freq: Every day | ORAL | Status: DC

## 2020-02-24 MED ORDER — ATORVASTATIN CALCIUM 80 MG PO TABS: 40.0000 mg | ORAL_TABLET | Freq: Every day | ORAL | 3 refills | Status: DC

## 2020-02-24 NOTE — Progress Notes (Signed)
Primary Care Provider: Antony Contras, MD Cardiologist: Glenetta Hew, MD Electrophysiologist: None  Clinic Note: Chief Complaint  Patient presents with  . Follow-up    annual , in person  . Coronary Artery Disease    No angina     HPI:    Chase Gibson is a 64 y.o. male with a PMH notable for CAD (BMS PCI to LAD in May 2016 in the setting of non-STEMI), COPD, hypertension, hyperlipidemia below who presents today for annual follow-up.  Chase Gibson was last seen on February 03, 2019 via telemedicine.  Besides having issue with influenza and bronchitis, he was doing relatively well.  Very active, having taken over custody of his grandkids.  PCP started him on Metformin and NovoLog because A1c was up to 12.  Baseline beta-blocker was changed propranolol because of tremor.  Recent Hospitalizations: None  Reviewed  CV studies:    The following studies were reviewed today: (if available, images/films reviewed: From Epic Chart or Care Everywhere) . None:   Interval History:   Chase Gibson returns here today for in person evaluation doing remarkably well.  He is very happy to note his close to 75 pound weight loss is 2018.  He has been very active with exercise and dietary modification.  Unfortunately now he is limited because of knee pain.  Regardless he still does what he is able to do just having to stop because his knee hurting him.  He has not had any cardiac symptoms of angina or dyspnea with exertion.  No heart failure symptoms of PND orthopnea or edema.  No signs or symptoms of arrhythmias.  Energy level seems to be better with his weight loss.   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  He is currently taking alternating aspirin and Plavix over the day.  Cardiovascular  Review of Symptoms (Summary): no chest pain or dyspnea on  exertion positive for - Easy bruising/bleeding negative for - edema, irregular heartbeat, loss of consciousness, orthopnea, palpitations, paroxysmal nocturnal dyspnea, rapid heart rate, shortness of breath or Lightheadedness, dizziness, syncope/near syncope, TIA/almost fugax s, claudication  The patient does not have symptoms concerning for COVID-19 infection (fever, chills, cough, or new shortness of breath).  The patient is practicing social distancing & Masking.   He has had both of his COVID-19 vaccine injections.  REVIEWED OF SYSTEMS   Review of Systems  Constitutional: Positive for weight loss (Had gained back some of the weight during the COVID-19 pandemic lockdown timeframe.  He has lost that weight back.  Watching his diet and increased exercise.). Negative for malaise/fatigue.  HENT: Negative for congestion and nosebleeds.   Respiratory: Positive for wheezing (Only related to allergies). Negative for cough and shortness of breath.   Cardiovascular: Negative for leg swelling.  Gastrointestinal: Positive for abdominal pain (Occasional left upper quadrant pain after eating.) and constipation. Negative for blood in stool and melena.  Genitourinary: Negative for hematuria.  Musculoskeletal: Positive for joint pain (Knee pain limits activities.).  Neurological: Negative for dizziness, focal weakness, weakness and headaches.  Endo/Heme/Allergies: Bruises/bleeds easily.  Psychiatric/Behavioral: Negative for depression. The patient has insomnia (Well-controlled when he takes Seroquel.  He just does not always like taking medication.). The patient is not nervous/anxious (Seems to have improved now that he is on Seroquel-no longer on BuSpar or Paxil.  Not even on Klonopin.).  I have reviewed and (if needed) personally updated the patient's problem list, medications, allergies, past medical and surgical history, social and family history.   PAST MEDICAL HISTORY   Past Medical History:    Diagnosis Date  . ALLERGIC RHINITIS 03/18/2009  . ANXIETY 06/24/2007   pt denies at PAT visit on 02/25/15  . ASTHMA 06/24/2007  . BREAST PAIN, LEFT 07/21/2008  . CAD S/P percutaneous coronary angioplasty 03/11/2015   SEVERE SINGLE VESSEL CAD OF THE PROX LAD - 95%--> 0% post PCI Vision BMS 3.5 x 18 (3.75 mm); MILD-MODERATE (~50%) NONOBSTRUCTIVE LCX AND RCA STENOSES   . CKD (chronic kidney disease), stage III   . Colon polyps 03/28/2012  . COPD (chronic obstructive pulmonary disease) ([place]) 03/28/2012  . DEPRESSION 06/24/2007  . DIABETES MELLITUS, TYPE II 07/21/2008  . GERD 08/25/2008   pt denies at PAT visit on 02/25/15  . History of non-ST elevation myocardial infarction (NSTEMI) 03/11/2015   Mild Troponin ~0.45, transient Anterior ST-T changes; LAD stenosis treated with BMS  . HYPERLIPIDEMIA 07/21/2008  . INSOMNIA-SLEEP DISORDER-UNSPEC 03/28/2010  . LOW BACK PAIN 09/03/2007  . Myocardial infarction ([place])   . NAUSEA 06/14/2010  . OSTEOARTHRITIS, KNEES, BILATERAL 08/25/2008  . PERIPHERAL EDEMA 07/21/2008  . SYMPTOM, ABNORMAL INVOLUNTARY MOVEMENT NEC 06/21/2007  . Tobacco abuse   . Vertigo 11/2014   pt has a bout every 3-4 months, no pattern  . Wheezing 07/13/2009    PAST SURGICAL HISTORY   Past Surgical History:  Procedure Laterality Date  . APPENDECTOMY    . CARDIAC CATHETERIZATION N/A 03/10/2015   Procedure: Left Heart Cath and Coronary Angiography;  Surgeon: Sherren Mocha, MD;  Location: [facility];  Service: Cardiovascula: EVERE SINGLE VESSEL CAD OF THE PROX LAD - 95%--> BMS PCI. MILD-MODERATE (~50%) NONOBSTRUCTIVE LCX AND RCA STENOSES   . CARDIAC CATHETERIZATION N/A 03/10/2015   Procedure: Coronary Stent Intervention;  Surgeon: Sherren Mocha, MD;  Location: [facility] INVASIVE CV LAB: PROX LAD - 95%--> 0% post PCI Vision BMS 3.5 x 18 (3.75 mm) - BMS used b/c upcoming knee Sgx.  . CHOLECYSTECTOMY    . inguinal herniorrhapy Bilateral    1979 & 1997  . KNEE ARTHROSCOPY Left    left  . KNEE  ARTHROSCOPY Left 09/17/2017   Procedure: ARTHROSCOPY KNEE, PATELLA FEMORAL  CHONDROPLASTY, PARTIAL MEDIAL MENISECTOMY;  Surgeon: Dereck Leep, MD;  Location: [facility];  Service: Orthopedics;  Laterality: Left;  . Removal of right total knee implants for periprosthetic infection  10/02/2014  . Right knee arthrotomy, debridement, and reinsertion of antibiotic cement spacer  12/18/2014  . right knee surgury     x 9  . ROTATOR CUFF REPAIR Bilateral    bilateral  . TONSILLECTOMY    . TOTAL KNEE ARTHROPLASTY Right 05/17/2015   Procedure: TOTAL KNEE ARTHROPLASTY, REMOVAL OF SPACER;  Surgeon: Dereck Leep, MD;  Location: [facility];  Service: Orthopedics;  Laterality: Right;  . TRANSTHORACIC ECHOCARDIOGRAM  03/10/2015   EF 65 and 70%. No RWMA. ~ normal diastolic parameters   0000000: Left Heart Cath and Coronary Angiography  - Coronary Stent Intervention   SEVERE SINGLE VESSEL CAD OF THE PROXIMAL LAD 95%-0%   PCI: STENT VISION RX [id number]; BALLOON [place] EUPHORA [id number]  MILD-MODERATE NONOBSTRUCTIVE LCX AND RCA STENOSES      MEDICATIONS/ALLERGIES   Current Meds  Medication Sig  . albuterol (VENTOLIN HFA) 108 (90 BASE) MCG/ACT inhaler Inhale 1-2 puffs into the lungs every 4 (four) hours as needed for wheezing or  shortness of breath.   Jearl Klinefelter ELLIPTA 62.5-25 MCG/INH AEPB Inhale 1 puff into the lungs daily.  Marland Kitchen aspirin EC 81 MG tablet Take 1 tablet (81 mg total) by mouth daily.  Marland Kitchen atorvastatin (LIPITOR) 80 MG tablet Take 40 mg by mouth daily.  . divalproex (DEPAKOTE) 250 MG DR tablet Take 750 mg by mouth daily. Take 3 tablets by mouth daily  . fluticasone (FLONASE) 50 MCG/ACT nasal spray Place 2 sprays into both nostrils daily.  . Fluticasone-Umeclidin-Vilant (TRELEGY ELLIPTA) 100-62.5-25 MCG/INH AEPB Inhale 1 puff into the lungs daily.  . Fluticasone-Umeclidin-Vilant (TRELEGY ELLIPTA) 100-62.5-25 MCG/INH AEPB Inhale 1 puff into the lungs daily.  Marland Kitchen HYDROcodone-acetaminophen (NORCO) 10-325 MG  per tablet Take 1 tablet by mouth every 6 (six) hours as needed for moderate pain.  Marland Kitchen insulin aspart protamine- aspart (NOVOLOG MIX 70/30) (70-30) 100 UNIT/ML injection Inject 35 Units into the skin 2 (two) times daily with a meal.  . ipratropium-albuterol (DUONEB) 0.5-2.5 (3) MG/3ML SOLN Take 3 mLs every 6 (six) hours as needed by nebulization (for shortness of breath or wheezing).   Marland Kitchen losartan (COZAAR) 25 MG tablet Take 25 mg by mouth daily.   . meclizine (ANTIVERT) 12.5 MG tablet Take 12.5 mg by mouth 3 (three) times daily as needed for dizziness.  . metFORMIN (GLUCOPHAGE) 500 MG tablet Take 1,000 mg by mouth daily.  . montelukast (SINGULAIR) 10 MG tablet Take 10 mg daily by mouth.   . nitroGLYCERIN (NITROSTAT) 0.4 MG SL tablet Place 1 tablet (0.4 mg total) under the tongue every 5 (five) minutes as needed for chest pain.  . Oxycodone HCl 10 MG TABS TAKE 1 TABLET BY MOUTH EVERY 6 HOURS  . primidone (MYSOLINE) 50 MG tablet Take 1 tablet (50 mg total) by mouth 2 (two) times daily.  . propranolol (INDERAL) 20 MG tablet Take 1 tablet (20 mg total) by mouth 2 (two) times daily.  . QUEtiapine (SEROQUEL) 100 MG tablet Take 300 mg at bedtime by mouth.   . roflumilast (DALIRESP) 500 MCG TABS tablet Take 1 tablet (500 mcg total) by mouth daily.  . [DISCONTINUED] aspirin EC 81 MG tablet Take one tablet every other day alternating with Clopidogrel 75 mg  . [DISCONTINUED] atorvastatin (LIPITOR) 80 MG tablet TAKE 1 TABLET(80 MG) BY MOUTH DAILY AT 6 PM  . [DISCONTINUED] atorvastatin (LIPITOR) 80 MG tablet Take 0.5 tablets (40 mg total) by mouth daily.  . [DISCONTINUED] clonazePAM (KLONOPIN) 0.5 MG tablet Take 0.5 mg by mouth daily.  . [DISCONTINUED] clopidogrel (PLAVIX) 75 MG tablet Take 1 tablet (75 mg total) by mouth daily. Or as directed.  . [DISCONTINUED] nicotine (NICODERM CQ - DOSED IN MG/24 HOURS) 14 mg/24hr patch Place 1 patch (14 mg total) onto the skin daily.  . [DISCONTINUED] predniSONE (DELTASONE)  10 MG tablet Take 4 tabs for 2 days, then 3 tabs for 2 days, then 2 tabs for 2 days, then 1 tab for 2 days, then stop   Current Facility-Administered Medications for the 02/24/20 encounter (Office Visit) with Leonie Man, MD  Medication  . 0.9 %  sodium chloride infusion  --taking atorvastatin 40 mg  Allergies  Allergen Reactions  . Tramadol Itching  . Nsaids Nausea And Vomiting and Rash    SOCIAL HISTORY/FAMILY HISTORY   Reviewed in Epic:  Social History   Tobacco Use  . Smoking status: Former Smoker    Packs/day: 1.00    Years: 35.00    Pack years: 35.00    Types: Cigarettes  Quit date: 2018    Years since quitting: 3.3  . Smokeless tobacco: Former Network engineer Use Topics  . Alcohol use: No  . Drug use: No   Pertinent findings: No longer able to walk as much as he used to because of significant knee pain.  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  This started off actually because of the COVID-19 pandemic minimizing opportunity coach.  OBJCTIVE -PE, EKG, labs   Wt Readings from Last 3 Encounters:  02/24/20 173 lb 6.4 oz (78.7 kg)  02/03/19 180 lb (81.6 kg)  12/31/18 170 lb 12.8 oz (77.5 kg)  When seen in early 2018, weighed 250 pounds -> then dropped to 208 pounds by December 2018  Physical Exam: BP 101/64   Pulse 65   Ht 5\' 10"  (1.778 m)   Wt 173 lb 6.4 oz (78.7 kg)   SpO2 95%   BMI 24.88 kg/m  Physical Exam  Constitutional: He is oriented to person, place, and time. He appears well-developed and well-nourished. No distress.  Relatively healthy-appearing.  Well-groomed  HENT:  Head: Normocephalic and atraumatic.  Neck: No hepatojugular reflux and no JVD present. Carotid bruit is not present.  Cardiovascular: Normal rate, regular rhythm, normal heart sounds and intact distal pulses.  No extrasystoles are present. PMI is not displaced. Exam reveals no gallop and no friction rub.  No murmur  heard. Pulmonary/Chest: Effort normal and breath sounds normal. No respiratory distress. He has no wheezes. He has no rales.  Abdominal: Soft. Bowel sounds are normal. He exhibits no distension. There is no abdominal tenderness.  No HSM  Musculoskeletal:        General: No edema. Normal range of motion.     Cervical back: Normal range of motion and neck supple.  Neurological: He is alert and oriented to person, place, and time.  Psychiatric: He has a normal mood and affect. His behavior is normal. Judgment and thought content normal.  Vitals reviewed.    Adult ECG Report  Rate: 61;  Rhythm: normal sinus rhythm and LAFB, RBBB (bifascicular block), LAA.;  Normal intervals and durations.  Narrative Interpretation: Stable EKG.  Stable EKG  Recent Labs: January 22, 2020: TC 81, TG 181, HDL 28, (LDL 5 from 06/26/2019); Hgb 14.1, Cr 1.27, K+ 4.1.  ASSESSMENT/PLAN   Problem List Items Addressed This Visit    Non-ST elevation myocardial infarction (NSTEMI), subsequent episode of care [facility]) (Chronic)    He is now 5 years out from his MI.  Really did not have any other significant disease besides his LAD that was stented.  He has not had no further heart failure or angina symptoms.  Relatively preserved EF on echocardiogram.        Relevant Medications   atorvastatin (LIPITOR) 80 MG tablet   aspirin EC 81 MG tablet   Other Relevant Orders   EKG 12-Lead (Completed)   CAD S/P LAD BMS 03/10/15 - Primary (Chronic)    Single-vessel severe CAD of the LAD with BMS stent back in 2016 during a non-STEMI.  No further anginal symptoms since then.  We recently converted him from Plavix alone to intermittent aspirin Plavix.   Plan:  At this point now 5 years out from his MI/PCI, I think we can simply have her go back to just 81 mg aspirin daily.  DC Plavix.  He has finally quit smoking now.  Says that that has helped his breathing out quite a  bit.  Is on ARB and statin along with propranolol (converted  because of tremor).  PCP has titrated up his glycemic control medications adding insulin and Metformin       Relevant Medications   atorvastatin (LIPITOR) 80 MG tablet   aspirin EC 81 MG tablet   Other Relevant Orders   EKG 12-Lead (Completed)   Hyperlipidemia with target LDL less than 70 (Chronic)    Most recent labs were outstanding. Plan is for him to actually take 40 mg atorvastatin.  If continues to be at the level where it has been in follow-up, we can probably reduce the 20 mg daily.      Relevant Medications   atorvastatin (LIPITOR) 80 MG tablet   aspirin EC 81 MG tablet   Other Relevant Orders   EKG 12-Lead (Completed)   Panic disorder without agoraphobia (Chronic)    This seems to be a little more stable now.  He is not really on any medications he has been on Xanax at night Klonopin and Paxil etc.  No longer on any medications besides nightly Seroquel for helping him sleep.  Thankfully, he does not seem to be having any more PTSD type symptoms from his MI.      Essential hypertension (Chronic)    Blood pressure looks great on propranolol and losartan.  Seems to doing well with propranolol as far as tremor goes.--Converted from Toprol because of tremor.      Relevant Medications   atorvastatin (LIPITOR) 80 MG tablet   aspirin EC 81 MG tablet   Other Relevant Orders   EKG 12-Lead (Completed)       COVID-19 Education: The signs and symptoms of COVID-19 were discussed with the patient and how to seek care for testing (follow up with PCP or arrange E-visit).   The importance of social distancing and COVID-19 vaccination was discussed today.  I spent a total of 22 minutes with the patient. >  50% of the time was spent in direct patient consultation.  Additional time spent with chart review  / charting (studies, outside notes, etc): 8 Total Time: 30 min   Current medicines are reviewed at length with the patient today.  (+/- concerns) none  Notice: This  dictation was prepared with Dragon dictation along with smaller phrase technology. Any transcriptional errors that result from this process are unintentional and may not be corrected upon review.  Patient Instructions / Medication Changes & Studies & Tests Ordered   Patient Instructions  Medication Instructions:   start taking aspirin 81 mg one tablet daily   Stop taking clopidogrel 75 mg    *If you need a refill on your cardiac medications before your next appointment, please call your pharmacy*   Lab Work: Not needed   Testing/Procedures: Not needed   Follow-Up: At [facility], you and your health needs are our priority.  As part of our continuing mission to provide you with exceptional heart care, we have created designated Provider Care Teams.  These Care Teams include your primary Cardiologist (physician) and Advanced Practice Providers (APPs -  Physician Assistants and Nurse Practitioners) who all work together to provide you with the care you need, when you need it.    Your next appointment:   12 month(s)  The format for your next appointment:   In Person  Provider:   Glenetta Hew, MD   Other Instructions n/a    Studies Ordered:   Orders Placed This Encounter  Procedures  . EKG 12-Lead  Glenetta Hew, M.D., M.S. Interventional Cardiologist   Pager # [phone] Phone # [phone] [address] [place], [place] [address]   Thank you for choosing Heartcare at [facility]!!

## 2020-02-24 NOTE — Patient Instructions (Addendum)
Medication Instructions:   start taking aspirin 81 mg one tablet daily   Stop taking clopidogrel 75 mg    *If you need a refill on your cardiac medications before your next appointment, please call your pharmacy*   Lab Work: Not needed   Testing/Procedures: Not needed   Follow-Up: At [facility], you and your health needs are our priority.  As part of our continuing mission to provide you with exceptional heart care, we have created designated Provider Care Teams.  These Care Teams include your primary Cardiologist (physician) and Advanced Practice Providers (APPs -  Physician Assistants and Nurse Practitioners) who all work together to provide you with the care you need, when you need it.    Your next appointment:   12 month(s)  The format for your next appointment:   In Person  Provider:   Glenetta Hew, MD   Other Instructions n/a

## 2020-02-29 ENCOUNTER — Encounter: Payer: Self-pay | Admitting: Cardiology

## 2020-02-29 NOTE — Assessment & Plan Note (Signed)
No longer on Januvia.  He is on insulin and Metformin.  Would consider SGLT2 inhibitor as an option for additional control given cardiovascular benefit.

## 2020-02-29 NOTE — Assessment & Plan Note (Signed)
This seems to be a little more stable now.  He is not really on any medications he has been on Xanax at night Klonopin and Paxil etc.  No longer on any medications besides nightly Seroquel for helping him sleep.  Thankfully, he does not seem to be having any more PTSD type symptoms from his MI.

## 2020-02-29 NOTE — Assessment & Plan Note (Signed)
Single-vessel severe CAD of the LAD with BMS stent back in 2016 during a non-STEMI.  No further anginal symptoms since then.  We recently converted him from Plavix alone to intermittent aspirin Plavix.   Plan:  At this point now 5 years out from his MI/PCI, I think we can simply have her go back to just 81 mg aspirin daily.  DC Plavix.  He has finally quit smoking now.  Says that that has helped his breathing out quite a bit.  Is on ARB and statin along with propranolol (converted because of tremor).  PCP has titrated up his glycemic control medications adding insulin and Metformin

## 2020-02-29 NOTE — Assessment & Plan Note (Addendum)
Blood pressure looks great on propranolol and losartan.  Seems to doing well with propranolol as far as tremor goes.--Converted from Toprol because of tremor.

## 2020-02-29 NOTE — Assessment & Plan Note (Signed)
He is now 5 years out from his MI.  Really did not have any other significant disease besides his LAD that was stented.  He has not had no further heart failure or angina symptoms.  Relatively preserved EF on echocardiogram.

## 2020-02-29 NOTE — Assessment & Plan Note (Signed)
Most recent labs were outstanding. Plan is for him to actually take 40 mg atorvastatin.  If continues to be at the level where it has been in follow-up, we can probably reduce the 20 mg daily.

## 2020-04-14 ENCOUNTER — Other Ambulatory Visit: Payer: Self-pay | Admitting: Cardiology

## 2020-05-07 ENCOUNTER — Other Ambulatory Visit: Payer: Self-pay | Admitting: Emergency Medicine

## 2020-05-12 ENCOUNTER — Other Ambulatory Visit: Payer: Self-pay | Admitting: Family Medicine

## 2020-05-12 DIAGNOSIS — I714 Abdominal aortic aneurysm, without rupture, unspecified: Secondary | ICD-10-CM

## 2020-05-17 ENCOUNTER — Ambulatory Visit
Admission: RE | Admit: 2020-05-17 | Discharge: 2020-05-17 | Disposition: A | Discharge status: Home / Self Care | Payer: Medicare Other | Source: Ambulatory Visit | Attending: Family Medicine | Admitting: Family Medicine

## 2020-05-17 DIAGNOSIS — I714 Abdominal aortic aneurysm, without rupture, unspecified: Secondary | ICD-10-CM

## 2020-05-18 ENCOUNTER — Telehealth: Payer: Self-pay | Admitting: Cardiology

## 2020-05-18 NOTE — Telephone Encounter (Signed)
New message  Patient is calling in to speak with Dr. Ellyn Hack or his nurse about his Aorta results. Please give patient a call back.

## 2020-05-18 NOTE — Telephone Encounter (Signed)
Pt is wanting you to review his [place] of aorta Per pt this was ordered by PCP . Pt is currently seeing a chiropractor for back pain and Chiropractor  will not tx based on the findings ./cy

## 2020-05-23 NOTE — Telephone Encounter (Signed)
He had an Abdominal Aorta Ultrasound that showed:  3.0 cm fusiform distal abdominal aortic aneurysm --> not very big!  We usually start getting concerned about abdominal aortic aneurysmal dilation when they get into the ~4.5 cm level & do not usually consider repair unless they were rapidly growing or larger than 5 cm.  Recommendation based on Guidelines is f/u study in 3 years.   Glenetta Hew, MD

## 2020-05-25 NOTE — Telephone Encounter (Signed)
Lm to call back ./cy 

## 2020-05-25 NOTE — Telephone Encounter (Signed)
Patient called back

## 2020-05-26 NOTE — Telephone Encounter (Signed)
Patient is calling to follow up

## 2020-05-26 NOTE — Telephone Encounter (Signed)
Pt updated and verbalized understanding.  

## 2020-06-23 ENCOUNTER — Other Ambulatory Visit: Payer: Self-pay | Admitting: Internal Medicine

## 2020-06-23 ENCOUNTER — Other Ambulatory Visit: Payer: Medicare Other

## 2020-06-23 DIAGNOSIS — M7989 Other specified soft tissue disorders: Secondary | ICD-10-CM

## 2020-10-27 DIAGNOSIS — R0981 Nasal congestion: Secondary | ICD-10-CM | POA: Diagnosis not present

## 2020-10-27 DIAGNOSIS — B349 Viral infection, unspecified: Secondary | ICD-10-CM | POA: Diagnosis not present

## 2020-10-27 DIAGNOSIS — R059 Cough, unspecified: Secondary | ICD-10-CM | POA: Diagnosis not present

## 2020-10-27 DIAGNOSIS — Z8709 Personal history of other diseases of the respiratory system: Secondary | ICD-10-CM | POA: Diagnosis not present

## 2020-10-27 DIAGNOSIS — J40 Bronchitis, not specified as acute or chronic: Secondary | ICD-10-CM | POA: Diagnosis not present

## 2020-10-27 DIAGNOSIS — R52 Pain, unspecified: Secondary | ICD-10-CM | POA: Diagnosis not present

## 2020-10-27 DIAGNOSIS — Z03818 Encounter for observation for suspected exposure to other biological agents ruled out: Secondary | ICD-10-CM | POA: Diagnosis not present

## 2020-10-28 DIAGNOSIS — Z20822 Contact with and (suspected) exposure to covid-19: Secondary | ICD-10-CM | POA: Diagnosis not present

## 2020-10-29 ENCOUNTER — Other Ambulatory Visit: Payer: Medicare Other

## 2020-11-02 DIAGNOSIS — R059 Cough, unspecified: Secondary | ICD-10-CM | POA: Diagnosis not present

## 2021-01-24 DIAGNOSIS — Z0000 Encounter for general adult medical examination without abnormal findings: Secondary | ICD-10-CM | POA: Diagnosis not present

## 2021-01-24 DIAGNOSIS — D692 Other nonthrombocytopenic purpura: Secondary | ICD-10-CM | POA: Diagnosis not present

## 2021-01-24 DIAGNOSIS — M545 Low back pain, unspecified: Secondary | ICD-10-CM | POA: Diagnosis not present

## 2021-01-24 DIAGNOSIS — G250 Essential tremor: Secondary | ICD-10-CM | POA: Diagnosis not present

## 2021-01-24 DIAGNOSIS — E875 Hyperkalemia: Secondary | ICD-10-CM | POA: Diagnosis not present

## 2021-01-24 DIAGNOSIS — J449 Chronic obstructive pulmonary disease, unspecified: Secondary | ICD-10-CM | POA: Diagnosis not present

## 2021-01-24 DIAGNOSIS — Z1389 Encounter for screening for other disorder: Secondary | ICD-10-CM | POA: Diagnosis not present

## 2021-01-24 DIAGNOSIS — E782 Mixed hyperlipidemia: Secondary | ICD-10-CM | POA: Diagnosis not present

## 2021-01-24 DIAGNOSIS — M79675 Pain in left toe(s): Secondary | ICD-10-CM | POA: Diagnosis not present

## 2021-01-24 DIAGNOSIS — N1831 Chronic kidney disease, stage 3a: Secondary | ICD-10-CM | POA: Diagnosis not present

## 2021-02-17 DIAGNOSIS — M65322 Trigger finger, left index finger: Secondary | ICD-10-CM | POA: Diagnosis not present

## 2021-03-18 DIAGNOSIS — J45909 Unspecified asthma, uncomplicated: Secondary | ICD-10-CM | POA: Diagnosis not present

## 2021-03-18 DIAGNOSIS — J441 Chronic obstructive pulmonary disease with (acute) exacerbation: Secondary | ICD-10-CM | POA: Diagnosis not present

## 2021-03-24 ENCOUNTER — Other Ambulatory Visit: Payer: Self-pay | Admitting: Cardiology

## 2021-06-16 DIAGNOSIS — J45909 Unspecified asthma, uncomplicated: Secondary | ICD-10-CM | POA: Diagnosis not present

## 2021-06-21 DIAGNOSIS — R5383 Other fatigue: Secondary | ICD-10-CM | POA: Diagnosis not present

## 2021-06-21 DIAGNOSIS — R051 Acute cough: Secondary | ICD-10-CM | POA: Diagnosis not present

## 2021-06-21 DIAGNOSIS — R0602 Shortness of breath: Secondary | ICD-10-CM | POA: Diagnosis not present

## 2021-06-21 DIAGNOSIS — U071 COVID-19: Secondary | ICD-10-CM | POA: Diagnosis not present

## 2021-08-01 DIAGNOSIS — J449 Chronic obstructive pulmonary disease, unspecified: Secondary | ICD-10-CM | POA: Diagnosis not present

## 2021-08-01 DIAGNOSIS — N1831 Chronic kidney disease, stage 3a: Secondary | ICD-10-CM | POA: Diagnosis not present

## 2021-08-01 DIAGNOSIS — Z23 Encounter for immunization: Secondary | ICD-10-CM | POA: Diagnosis not present

## 2021-08-01 DIAGNOSIS — Z7984 Long term (current) use of oral hypoglycemic drugs: Secondary | ICD-10-CM | POA: Diagnosis not present

## 2021-08-01 DIAGNOSIS — E875 Hyperkalemia: Secondary | ICD-10-CM | POA: Diagnosis not present

## 2021-08-01 DIAGNOSIS — E782 Mixed hyperlipidemia: Secondary | ICD-10-CM | POA: Diagnosis not present

## 2021-08-01 DIAGNOSIS — I2510 Atherosclerotic heart disease of native coronary artery without angina pectoris: Secondary | ICD-10-CM | POA: Diagnosis not present

## 2021-08-01 DIAGNOSIS — E0822 Diabetes mellitus due to underlying condition with diabetic chronic kidney disease: Secondary | ICD-10-CM | POA: Diagnosis not present

## 2021-08-01 DIAGNOSIS — M545 Low back pain, unspecified: Secondary | ICD-10-CM | POA: Diagnosis not present

## 2021-08-01 DIAGNOSIS — J45909 Unspecified asthma, uncomplicated: Secondary | ICD-10-CM | POA: Diagnosis not present

## 2021-08-01 DIAGNOSIS — Z794 Long term (current) use of insulin: Secondary | ICD-10-CM | POA: Diagnosis not present

## 2021-08-02 ENCOUNTER — Emergency Department (HOSPITAL_COMMUNITY)
Admission: EM | Admit: 2021-08-02 | Discharge: 2021-08-02 | Disposition: A | Discharge status: Home / Self Care | Payer: Medicare Other | Attending: Emergency Medicine | Admitting: Emergency Medicine

## 2021-08-02 ENCOUNTER — Emergency Department (HOSPITAL_COMMUNITY): Payer: Medicare Other

## 2021-08-02 ENCOUNTER — Other Ambulatory Visit: Payer: Self-pay

## 2021-08-02 DIAGNOSIS — Z79899 Other long term (current) drug therapy: Secondary | ICD-10-CM | POA: Diagnosis not present

## 2021-08-02 DIAGNOSIS — M546 Pain in thoracic spine: Secondary | ICD-10-CM | POA: Diagnosis not present

## 2021-08-02 DIAGNOSIS — I129 Hypertensive chronic kidney disease with stage 1 through stage 4 chronic kidney disease, or unspecified chronic kidney disease: Secondary | ICD-10-CM | POA: Diagnosis not present

## 2021-08-02 DIAGNOSIS — R001 Bradycardia, unspecified: Secondary | ICD-10-CM | POA: Diagnosis not present

## 2021-08-02 DIAGNOSIS — E875 Hyperkalemia: Secondary | ICD-10-CM

## 2021-08-02 DIAGNOSIS — M25511 Pain in right shoulder: Secondary | ICD-10-CM | POA: Insufficient documentation

## 2021-08-02 DIAGNOSIS — J449 Chronic obstructive pulmonary disease, unspecified: Secondary | ICD-10-CM | POA: Insufficient documentation

## 2021-08-02 DIAGNOSIS — M542 Cervicalgia: Secondary | ICD-10-CM | POA: Diagnosis not present

## 2021-08-02 DIAGNOSIS — E119 Type 2 diabetes mellitus without complications: Secondary | ICD-10-CM | POA: Diagnosis not present

## 2021-08-02 DIAGNOSIS — M4312 Spondylolisthesis, cervical region: Secondary | ICD-10-CM | POA: Diagnosis not present

## 2021-08-02 DIAGNOSIS — Z7982 Long term (current) use of aspirin: Secondary | ICD-10-CM | POA: Insufficient documentation

## 2021-08-02 DIAGNOSIS — R220 Localized swelling, mass and lump, head: Secondary | ICD-10-CM | POA: Diagnosis not present

## 2021-08-02 DIAGNOSIS — M47812 Spondylosis without myelopathy or radiculopathy, cervical region: Secondary | ICD-10-CM | POA: Diagnosis not present

## 2021-08-02 DIAGNOSIS — Z041 Encounter for examination and observation following transport accident: Secondary | ICD-10-CM | POA: Diagnosis not present

## 2021-08-02 DIAGNOSIS — J45909 Unspecified asthma, uncomplicated: Secondary | ICD-10-CM | POA: Insufficient documentation

## 2021-08-02 DIAGNOSIS — Z7984 Long term (current) use of oral hypoglycemic drugs: Secondary | ICD-10-CM | POA: Insufficient documentation

## 2021-08-02 DIAGNOSIS — Z96651 Presence of right artificial knee joint: Secondary | ICD-10-CM | POA: Diagnosis not present

## 2021-08-02 DIAGNOSIS — Z87891 Personal history of nicotine dependence: Secondary | ICD-10-CM | POA: Diagnosis not present

## 2021-08-02 DIAGNOSIS — N189 Chronic kidney disease, unspecified: Secondary | ICD-10-CM | POA: Insufficient documentation

## 2021-08-02 DIAGNOSIS — M2578 Osteophyte, vertebrae: Secondary | ICD-10-CM | POA: Diagnosis not present

## 2021-08-02 DIAGNOSIS — S0990XA Unspecified injury of head, initial encounter: Secondary | ICD-10-CM | POA: Diagnosis present

## 2021-08-02 DIAGNOSIS — S060X0A Concussion without loss of consciousness, initial encounter: Secondary | ICD-10-CM | POA: Diagnosis not present

## 2021-08-02 LAB — CBC WITH DIFFERENTIAL/PLATELET
Abs Immature Granulocytes: 0.02 10*3/uL (ref 0.00–0.07)
Basophils Absolute: 0.1 10*3/uL (ref 0.0–0.1)
Basophils Relative: 1 %
Eosinophils Absolute: 0.5 10*3/uL (ref 0.0–0.5)
Eosinophils Relative: 6 %
HCT: 43.5 % (ref 39.0–52.0)
Hemoglobin: 14.3 g/dL (ref 13.0–17.0)
Immature Granulocytes: 0 %
Lymphocytes Relative: 40 %
Lymphs Abs: 3.3 10*3/uL (ref 0.7–4.0)
MCH: 30.6 pg (ref 26.0–34.0)
MCHC: 32.9 g/dL (ref 30.0–36.0)
MCV: 93.1 fL (ref 80.0–100.0)
Monocytes Absolute: 0.7 10*3/uL (ref 0.1–1.0)
Monocytes Relative: 8 %
Neutro Abs: 3.8 10*3/uL (ref 1.7–7.7)
Neutrophils Relative %: 45 %
Platelets: 149 10*3/uL — ABNORMAL LOW (ref 150–400)
RBC: 4.67 MIL/uL (ref 4.22–5.81)
RDW: 14.2 % (ref 11.5–15.5)
WBC: 8.3 10*3/uL (ref 4.0–10.5)
nRBC: 0 % (ref 0.0–0.2)

## 2021-08-02 LAB — COMPREHENSIVE METABOLIC PANEL
ALT: 13 U/L (ref 0–44)
AST: 18 U/L (ref 15–41)
Albumin: 3.8 g/dL (ref 3.5–5.0)
Alkaline Phosphatase: 106 U/L (ref 38–126)
Anion gap: 7 (ref 5–15)
BUN: 25 mg/dL — ABNORMAL HIGH (ref 8–23)
CO2: 26 mmol/L (ref 22–32)
Calcium: 9.6 mg/dL (ref 8.9–10.3)
Chloride: 104 mmol/L (ref 98–111)
Creatinine, Ser: 1.41 mg/dL — ABNORMAL HIGH (ref 0.61–1.24)
GFR, Estimated: 56 mL/min — ABNORMAL LOW (ref >60)
Glucose, Bld: 84 mg/dL (ref 70–99)
Potassium: 5.8 mmol/L — ABNORMAL HIGH (ref 3.5–5.1)
Sodium: 137 mmol/L (ref 135–145)
Total Bilirubin: 0.4 mg/dL (ref 0.3–1.2)
Total Protein: 7.1 g/dL (ref 6.5–8.1)

## 2021-08-02 LAB — ETHANOL: Alcohol, Ethyl (B): <10 mg/dL (ref <10)

## 2021-08-02 LAB — CBG MONITORING, ED: Glucose-Capillary: 81 mg/dL (ref 70–99)

## 2021-08-02 MED ORDER — OXYCODONE-ACETAMINOPHEN 5-325 MG PO TABS
1.0000 | ORAL_TABLET | Freq: Once | ORAL | Status: AC
  Administered 2021-08-02: 1 via ORAL
  Filled 2021-08-02: qty 1

## 2021-08-02 MED ORDER — IOHEXOL 350 MG/ML SOLN: 80.0000 mL | Freq: Once | INTRAVENOUS | Status: DC | PRN

## 2021-08-02 MED ORDER — SODIUM ZIRCONIUM CYCLOSILICATE 5 G PO PACK
5.0000 g | PACK | Freq: Once | ORAL | Status: AC
  Administered 2021-08-02: 5 g via ORAL
  Filled 2021-08-02: qty 1
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
## 2021-08-05 DIAGNOSIS — S060X0D Concussion without loss of consciousness, subsequent encounter: Secondary | ICD-10-CM | POA: Diagnosis not present

## 2021-08-05 DIAGNOSIS — E782 Mixed hyperlipidemia: Secondary | ICD-10-CM | POA: Diagnosis not present

## 2021-08-05 DIAGNOSIS — M542 Cervicalgia: Secondary | ICD-10-CM | POA: Diagnosis not present

## 2021-08-05 DIAGNOSIS — E875 Hyperkalemia: Secondary | ICD-10-CM | POA: Diagnosis not present

## 2021-08-29 DIAGNOSIS — H2513 Age-related nuclear cataract, bilateral: Secondary | ICD-10-CM | POA: Diagnosis not present

## 2021-08-29 DIAGNOSIS — H40033 Anatomical narrow angle, bilateral: Secondary | ICD-10-CM | POA: Diagnosis not present

## 2021-10-03 DIAGNOSIS — R52 Pain, unspecified: Secondary | ICD-10-CM | POA: Diagnosis not present

## 2021-10-03 DIAGNOSIS — Z20822 Contact with and (suspected) exposure to covid-19: Secondary | ICD-10-CM | POA: Diagnosis not present

## 2021-10-03 DIAGNOSIS — R197 Diarrhea, unspecified: Secondary | ICD-10-CM | POA: Diagnosis not present

## 2021-10-03 DIAGNOSIS — J069 Acute upper respiratory infection, unspecified: Secondary | ICD-10-CM | POA: Diagnosis not present

## 2021-10-17 ENCOUNTER — Other Ambulatory Visit: Payer: Self-pay | Admitting: Cardiology

## 2021-11-22 ENCOUNTER — Emergency Department (HOSPITAL_COMMUNITY)
Admission: EM | Admit: 2021-11-22 | Discharge: 2021-11-23 | Disposition: A | Discharge status: Home / Self Care | Payer: Medicare Other | Attending: Emergency Medicine | Admitting: Emergency Medicine

## 2021-11-22 DIAGNOSIS — J441 Chronic obstructive pulmonary disease with (acute) exacerbation: Secondary | ICD-10-CM | POA: Diagnosis not present

## 2021-11-22 DIAGNOSIS — R0602 Shortness of breath: Secondary | ICD-10-CM | POA: Diagnosis not present

## 2021-11-22 DIAGNOSIS — R9431 Abnormal electrocardiogram [ECG] [EKG]: Secondary | ICD-10-CM | POA: Diagnosis not present

## 2021-11-22 DIAGNOSIS — Z20822 Contact with and (suspected) exposure to covid-19: Secondary | ICD-10-CM | POA: Insufficient documentation

## 2021-11-22 DIAGNOSIS — Z79899 Other long term (current) drug therapy: Secondary | ICD-10-CM | POA: Diagnosis not present

## 2021-11-22 NOTE — ED Provider Triage Note (Signed)
Emergency Medicine Provider Triage Evaluation Note  Chase Gibson , a 66 y.o. male  was evaluated in triage.  Pt complains of shortness of breath over the last week.  Has been worsening over the last several days.  He reports associated PND but denies chest pain, congestion, sore throat, fever, chills, abdominal pain.  No leg swelling.  No orthopnea.  Does report associated cough characterizes productive with clear sputum  Review of Systems  Positive:  Negative: See above   Physical Exam  BP (!) 152/81 (BP Location: Left Arm)    Pulse 72    Temp 98 F (36.7 C) (Oral)    Resp 16    Ht 5\' 10"  (1.778 m)    Wt 72.6 kg    SpO2 95%    BMI 22.96 kg/m  Gen:   Awake, no distress   Resp:  Normal effort, moderate diffuse expiratory wheezing MSK:   Moves extremities without difficulty  Other:    Medical Decision Making  Medically screening exam initiated at 11:57 PM.  Appropriate orders placed.  Chase Gibson was informed that the remainder of the evaluation will be completed by another provider, this initial triage assessment does not replace that evaluation, and the importance of remaining in the ED until their evaluation is complete.     Chase Gibson, [place] 11/23/21 0003

## 2021-11-23 ENCOUNTER — Emergency Department (HOSPITAL_COMMUNITY): Payer: Medicare Other

## 2021-11-23 ENCOUNTER — Encounter (HOSPITAL_COMMUNITY): Payer: Self-pay

## 2021-11-23 DIAGNOSIS — R0602 Shortness of breath: Secondary | ICD-10-CM | POA: Diagnosis not present

## 2021-11-23 LAB — CBC WITH DIFFERENTIAL/PLATELET
Abs Immature Granulocytes: 0.05 10*3/uL (ref 0.00–0.07)
Basophils Absolute: 0.1 10*3/uL (ref 0.0–0.1)
Basophils Relative: 1 %
Eosinophils Absolute: 1.4 10*3/uL — ABNORMAL HIGH (ref 0.0–0.5)
Eosinophils Relative: 17 %
HCT: 39.4 % (ref 39.0–52.0)
Hemoglobin: 13.3 g/dL (ref 13.0–17.0)
Immature Granulocytes: 1 %
Lymphocytes Relative: 40 %
Lymphs Abs: 3.2 10*3/uL (ref 0.7–4.0)
MCH: 31.1 pg (ref 26.0–34.0)
MCHC: 33.8 g/dL (ref 30.0–36.0)
MCV: 92.1 fL (ref 80.0–100.0)
Monocytes Absolute: 0.7 10*3/uL (ref 0.1–1.0)
Monocytes Relative: 8 %
Neutro Abs: 2.7 10*3/uL (ref 1.7–7.7)
Neutrophils Relative %: 33 %
Platelets: 173 10*3/uL (ref 150–400)
RBC: 4.28 MIL/uL (ref 4.22–5.81)
RDW: 14.4 % (ref 11.5–15.5)
WBC: 8 10*3/uL (ref 4.0–10.5)
nRBC: 0 % (ref 0.0–0.2)

## 2021-11-23 LAB — RESP PANEL BY RT-PCR (FLU A&B, COVID) ARPGX2
Influenza A by PCR: NEGATIVE
Influenza B by PCR: NEGATIVE
SARS Coronavirus 2 by RT PCR: NEGATIVE

## 2021-11-23 LAB — BASIC METABOLIC PANEL
Anion gap: 7 (ref 5–15)
BUN: 23 mg/dL (ref 8–23)
CO2: 24 mmol/L (ref 22–32)
Calcium: 8.7 mg/dL — ABNORMAL LOW (ref 8.9–10.3)
Chloride: 108 mmol/L (ref 98–111)
Creatinine, Ser: 1.04 mg/dL (ref 0.61–1.24)
GFR, Estimated: >60 mL/min (ref >60)
Glucose, Bld: 137 mg/dL — ABNORMAL HIGH (ref 70–99)
Potassium: 5.1 mmol/L (ref 3.5–5.1)
Sodium: 139 mmol/L (ref 135–145)

## 2021-11-23 LAB — TROPONIN I (HIGH SENSITIVITY): Troponin I (High Sensitivity): 4 ng/L (ref <18)

## 2021-11-23 LAB — BRAIN NATRIURETIC PEPTIDE: B Natriuretic Peptide: 39.4 pg/mL (ref 0.0–100.0)

## 2021-11-23 MED ORDER — METHYLPREDNISOLONE 4 MG PO TBPK: ORAL_TABLET | ORAL | 0 refills | Status: DC

## 2021-11-23 MED ORDER — IPRATROPIUM-ALBUTEROL 0.5-2.5 (3) MG/3ML IN SOLN
3.0000 mL | Freq: Once | RESPIRATORY_TRACT | Status: AC
  Administered 2021-11-23: 3 mL via RESPIRATORY_TRACT
  Filled 2021-11-23: qty 3

## 2021-11-23 MED ORDER — MAGNESIUM SULFATE 2 GM/50ML IV SOLN
2.0000 g | Freq: Once | INTRAVENOUS | Status: AC
  Administered 2021-11-23: 2 g via INTRAVENOUS
  Filled 2021-11-23: qty 50

## 2021-11-23 MED ORDER — METHYLPREDNISOLONE SODIUM SUCC 125 MG IJ SOLR
125.0000 mg | Freq: Once | INTRAMUSCULAR | Status: AC
  Administered 2021-11-23: 125 mg via INTRAVENOUS
  Filled 2021-11-23: qty 2

## 2021-11-23 MED ORDER — AZITHROMYCIN 250 MG PO TABS: ORAL_TABLET | ORAL | 0 refills | Status: DC

## 2021-11-23 NOTE — Discharge Instructions (Signed)
Get help right away if: You have worsening shortness of breath, even when resting. You have trouble talking. You have severe chest pain. You cough up blood. You have a fever. You have weakness, vomit repeatedly, or faint. You feel confused. You are not able to sleep because of your symptoms. You have trouble doing daily activities.

## 2021-11-23 NOTE — ED Provider Notes (Signed)
[place] DEPT Provider Note   CSN: [id number] Arrival date & time: 11/22/21  2340     History  Chief Complaint  Patient presents with   Shortness of Breath    Chase Gibson is a 66 y.o. male with a past medical history of smoking, quit more than 7 weeks ago, chronic bronchitis/COPD who presents emergency department with cough and shortness of breath.  He has had worsening cough, worse at night productive of clear sputum beginning 1 week ago.  Patient states that he is having exertional dyspnea and has been using his inhaler more frequently.  Patient feels like this is a COPD exacerbation states that he does well with treatment with steroids and antibiotics.  He denies fevers or chills   Shortness of Breath     Home Medications Prior to Admission medications   Medication Sig Start Date End Date Taking? Authorizing Provider  azithromycin (ZITHROMAX Z-PAK) 250 MG tablet 500mg  PO day 1, then 250mg  PO daily 11/23/21  Yes Marqueta Pulley, PA-C  methylPREDNISolone (MEDROL DOSEPAK) 4 MG TBPK tablet Use as directed 11/23/21  Yes Emerald Gehres, PA-C  albuterol (VENTOLIN HFA) 108 (90 BASE) MCG/ACT inhaler Inhale 1-2 puffs into the lungs every 4 (four) hours as needed for wheezing or shortness of breath.     [provider]  ANORO ELLIPTA 62.5-25 MCG/INH AEPB Inhale 1 puff into the lungs daily. 12/25/18   [provider]  aspirin EC 81 MG tablet Take 1 tablet (81 mg total) by mouth daily. 02/24/20   Leonie Man, MD  atorvastatin (LIPITOR) 80 MG tablet TAKE 1/2 TABLET BY MOUTH EVERY DAY 10/18/21   Leonie Man, MD  divalproex (DEPAKOTE) 250 MG DR tablet Take 750 mg by mouth daily. Take 3 tablets by mouth daily    [provider]  fluticasone (FLONASE) 50 MCG/ACT nasal spray SHAKE LIQUID AND USE 2 SPRAYS IN EACH NOSTRIL DAILY 05/11/20   Collene Gobble, MD  Fluticasone-Umeclidin-Vilant (TRELEGY ELLIPTA) 100-62.5-25 MCG/INH AEPB  Inhale 1 puff into the lungs daily. 12/31/18   Collene Gobble, MD  Fluticasone-Umeclidin-Vilant (TRELEGY ELLIPTA) 100-62.5-25 MCG/INH AEPB Inhale 1 puff into the lungs daily. 04/17/19   Fenton Foy, NP  HYDROcodone-acetaminophen (NORCO) 10-325 MG per tablet Take 1 tablet by mouth every 6 (six) hours as needed for moderate pain. 03/28/12   Biagio Borg, MD  insulin aspart protamine- aspart (NOVOLOG MIX 70/30) (70-30) 100 UNIT/ML injection Inject 35 Units into the skin 2 (two) times daily with a meal.    [provider]  ipratropium-albuterol (DUONEB) 0.5-2.5 (3) MG/3ML SOLN Take 3 mLs every 6 (six) hours as needed by nebulization (for shortness of breath or wheezing).     [provider]  losartan (COZAAR) 25 MG tablet Take 25 mg by mouth daily.  04/23/17   [provider]  meclizine (ANTIVERT) 12.5 MG tablet Take 12.5 mg by mouth 3 (three) times daily as needed for dizziness.    [provider]  metFORMIN (GLUCOPHAGE) 500 MG tablet Take 1,000 mg by mouth daily.    [provider]  montelukast (SINGULAIR) 10 MG tablet Take 10 mg daily by mouth.  09/28/11   Biagio Borg, MD  nitroGLYCERIN (NITROSTAT) 0.4 MG SL tablet Place 1 tablet (0.4 mg total) under the tongue every 5 (five) minutes as needed for chest pain. 02/03/19   Leonie Man, MD  Oxycodone HCl 10 MG TABS TAKE 1 TABLET BY MOUTH EVERY 6 HOURS  01/22/20   [provider]  primidone (MYSOLINE) 50 MG tablet Take 1 tablet (50 mg total) by mouth 2 (two) times daily. 09/25/17   Tat, Eustace Quail, DO  propranolol (INDERAL) 20 MG tablet TAKE 1 TABLET(20 MG) BY MOUTH TWICE DAILY 04/15/20   Leonie Man, MD  QUEtiapine (SEROQUEL) 100 MG tablet Take 300 mg at bedtime by mouth.  11/19/15   [provider]  roflumilast (DALIRESP) 500 MCG TABS tablet Take 1 tablet (500 mcg total) by mouth daily. 04/18/19   Fenton Foy, NP      Allergies    Tramadol and Nsaids    Review of Systems   Review  of Systems  Respiratory:  Positive for shortness of breath.    Physical Exam Updated Vital Signs BP (!) 152/81 (BP Location: Left Arm)    Pulse 72    Temp 98 F (36.7 C) (Oral)    Resp 16    Ht 5\' 10"  (1.778 m)    Wt 72.6 kg    SpO2 95%    BMI 22.96 kg/m  Physical Exam Vitals and nursing note reviewed.  Constitutional:      General: He is not in acute distress.    Appearance: He is well-developed. He is not diaphoretic.  HENT:     Head: Normocephalic and atraumatic.  Eyes:     General: No scleral icterus.    Conjunctiva/sclera: Conjunctivae normal.  Cardiovascular:     Rate and Rhythm: Normal rate and regular rhythm.     Heart sounds: Normal heart sounds.  Pulmonary:     Effort: Pulmonary effort is normal. No respiratory distress.     Breath sounds: Decreased air movement present. Examination of the right-upper field reveals wheezing. Examination of the left-upper field reveals wheezing. Examination of the left-middle field reveals wheezing. Examination of the left-lower field reveals wheezing. Wheezing present.  Abdominal:     Palpations: Abdomen is soft.     Tenderness: There is no abdominal tenderness.  Musculoskeletal:     Cervical back: Normal range of motion and neck supple.  Skin:    General: Skin is warm and dry.  Neurological:     Mental Status: He is alert.  Psychiatric:        Behavior: Behavior normal.    ED Results / Procedures / Treatments   Labs (all labs ordered are listed, but only abnormal results are displayed) Labs Reviewed  CBC WITH DIFFERENTIAL/PLATELET - Abnormal; Notable for the following components:      Result Value   Eosinophils Absolute 1.4 (*)    All other components within normal limits  BASIC METABOLIC PANEL - Abnormal; Notable for the following components:   Glucose, Bld 137 (*)    Calcium 8.7 (*)    All other components within normal limits  RESP PANEL BY RT-PCR (FLU A&B, COVID) ARPGX2  BRAIN NATRIURETIC PEPTIDE  TROPONIN I (HIGH  SENSITIVITY)    EKG None  Radiology DG Chest 2 View  Result Date: 11/23/2021 CLINICAL DATA:  Increasing shortness of breath. EXAM: CHEST - 2 VIEW COMPARISON:  Chest x-ray 11/01/2018. FINDINGS: The heart size and mediastinal contours are within normal limits. Both lungs are clear. The visualized skeletal structures are unremarkable. IMPRESSION: No active cardiopulmonary disease. Electronically Signed   By: Ronney Asters M.D.   On: 11/23/2021 00:22    Procedures Procedures    Medications Ordered in ED Medications  methylPREDNISolone sodium succinate (SOLU-MEDROL) 125 mg/2 mL injection 125 mg (125 mg Intravenous Given  11/23/21 0048)  magnesium sulfate IVPB 2 g 50 mL (0 g Intravenous Stopped 11/23/21 0159)  ipratropium-albuterol (DUONEB) 0.5-2.5 (3) MG/3ML nebulizer solution 3 mL (3 mLs Nebulization Given 11/23/21 0049)    ED Course/ Medical Decision Making/ A&P Clinical Course as of 11/23/21 0220  Wed Nov 23, 2021  0213 Patient reevaluated.  No active wheezing, excellent air movement, patient states he is feeling much better. [AH]  0219 Resp Panel by RT-PCR (Flu A&B, Covid) Nasopharyngeal Swab [AH]  0712 Basic metabolic panel(!) [AH]  1975 Glucose(!): 137 Mild hyperglycemia  [AH]  0219 Troponin I (High Sensitivity) [AH]  0219 CBC with Differential(!) Trop/cbc wnl [AH]  0220 DG Chest 2 View I interpreted two-view chest x-ray which shows no acute findings [AH]  0220 EKG 12-Lead EKG shows sinus arrhythmia at a rate of 79  [AH]    Clinical Course User Index [AH] Margarita Mail, PA-C                           Medical Decision Making Pt here with sob. The emergent differential diagnosis for shortness of breath includes, but is not limited to, Pulmonary edema, bronchoconstriction, Pneumonia, Pulmonary embolism, Pneumotherax/ Hemothorax, Dysrythmia, ACS.   Patient appears to have COPD exacerbation as the cause.  Tx: DuoNeb, Solu-Medrol, magnesium.  Reevaluation patient is  significantly improved and his breathing.  At discharge patient is without hypoxia or tachypnea.  Will discharge with azithromycin, Medrol Dosepak.  Patient has multiple albuterol MDI inhalers at home.  He is advised to follow closely with PCP. Discussed return precautions  Amount and/or Complexity of Data Reviewed Independent Historian:     Details: Son at bedside Labs: ordered. Decision-making details documented in ED Course. Radiology: ordered and independent interpretation performed. Decision-making details documented in ED Course.  Risk Prescription drug management.   Final Clinical Impression(s) / ED Diagnoses Final diagnoses:  COPD exacerbation ([place])    Rx / DC Orders ED Discharge Orders          Ordered    azithromycin (ZITHROMAX Z-PAK) 250 MG tablet        11/23/21 0215    methylPREDNISolone (MEDROL DOSEPAK) 4 MG TBPK tablet        11/23/21 0215              Margarita Mail, PA-C 11/23/21 0222    Maudie Flakes, MD 11/23/21 [phone]

## 2021-11-23 NOTE — ED Triage Notes (Signed)
Patient arrives from home with complaint of SOB that began x 1 week ago. Pt endorses waking up in the middle of the night unable to catch his breath. Hx COPD, asthma

## 2021-11-24 DIAGNOSIS — J441 Chronic obstructive pulmonary disease with (acute) exacerbation: Secondary | ICD-10-CM | POA: Diagnosis not present

## 2021-12-12 DIAGNOSIS — J449 Chronic obstructive pulmonary disease, unspecified: Secondary | ICD-10-CM | POA: Diagnosis not present

## 2021-12-12 DIAGNOSIS — J45909 Unspecified asthma, uncomplicated: Secondary | ICD-10-CM | POA: Diagnosis not present

## 2021-12-19 ENCOUNTER — Ambulatory Visit: Payer: Medicare Other | Admitting: Nurse Practitioner

## 2021-12-26 DIAGNOSIS — H40033 Anatomical narrow angle, bilateral: Secondary | ICD-10-CM | POA: Diagnosis not present

## 2021-12-26 DIAGNOSIS — E119 Type 2 diabetes mellitus without complications: Secondary | ICD-10-CM | POA: Diagnosis not present

## 2022-01-13 ENCOUNTER — Other Ambulatory Visit: Payer: Self-pay | Admitting: Cardiology

## 2022-02-02 DIAGNOSIS — I2510 Atherosclerotic heart disease of native coronary artery without angina pectoris: Secondary | ICD-10-CM | POA: Diagnosis not present

## 2022-02-02 DIAGNOSIS — Z0000 Encounter for general adult medical examination without abnormal findings: Secondary | ICD-10-CM | POA: Diagnosis not present

## 2022-02-02 DIAGNOSIS — E782 Mixed hyperlipidemia: Secondary | ICD-10-CM | POA: Diagnosis not present

## 2022-02-02 DIAGNOSIS — E875 Hyperkalemia: Secondary | ICD-10-CM | POA: Diagnosis not present

## 2022-02-02 DIAGNOSIS — N1831 Chronic kidney disease, stage 3a: Secondary | ICD-10-CM | POA: Diagnosis not present

## 2022-02-02 DIAGNOSIS — J449 Chronic obstructive pulmonary disease, unspecified: Secondary | ICD-10-CM | POA: Diagnosis not present

## 2022-02-02 DIAGNOSIS — Z7984 Long term (current) use of oral hypoglycemic drugs: Secondary | ICD-10-CM | POA: Diagnosis not present

## 2022-02-02 DIAGNOSIS — Z794 Long term (current) use of insulin: Secondary | ICD-10-CM | POA: Diagnosis not present

## 2022-02-02 DIAGNOSIS — M545 Low back pain, unspecified: Secondary | ICD-10-CM | POA: Diagnosis not present

## 2022-02-02 DIAGNOSIS — D649 Anemia, unspecified: Secondary | ICD-10-CM | POA: Diagnosis not present

## 2022-02-02 DIAGNOSIS — E0822 Diabetes mellitus due to underlying condition with diabetic chronic kidney disease: Secondary | ICD-10-CM | POA: Diagnosis not present

## 2022-03-26 ENCOUNTER — Other Ambulatory Visit: Payer: Self-pay | Admitting: Cardiology

## 2022-03-27 ENCOUNTER — Other Ambulatory Visit: Payer: Self-pay | Admitting: Cardiology

## 2022-06-01 ENCOUNTER — Other Ambulatory Visit: Payer: Self-pay | Admitting: Cardiology

## 2022-06-02 ENCOUNTER — Other Ambulatory Visit: Payer: Self-pay | Admitting: Cardiology

## 2022-06-05 ENCOUNTER — Encounter: Payer: Self-pay | Admitting: Gastroenterology

## 2022-07-12 ENCOUNTER — Other Ambulatory Visit: Payer: Self-pay | Admitting: Cardiology

## 2022-07-13 ENCOUNTER — Other Ambulatory Visit: Payer: Self-pay | Admitting: Cardiology

## 2022-07-24 DIAGNOSIS — M25562 Pain in left knee: Secondary | ICD-10-CM | POA: Diagnosis not present

## 2022-07-24 DIAGNOSIS — M549 Dorsalgia, unspecified: Secondary | ICD-10-CM | POA: Diagnosis not present

## 2022-08-08 DIAGNOSIS — D649 Anemia, unspecified: Secondary | ICD-10-CM | POA: Diagnosis not present

## 2022-08-08 DIAGNOSIS — E875 Hyperkalemia: Secondary | ICD-10-CM | POA: Diagnosis not present

## 2022-08-08 DIAGNOSIS — I2510 Atherosclerotic heart disease of native coronary artery without angina pectoris: Secondary | ICD-10-CM | POA: Diagnosis not present

## 2022-08-08 DIAGNOSIS — K219 Gastro-esophageal reflux disease without esophagitis: Secondary | ICD-10-CM | POA: Diagnosis not present

## 2022-08-08 DIAGNOSIS — Z23 Encounter for immunization: Secondary | ICD-10-CM | POA: Diagnosis not present

## 2022-08-08 DIAGNOSIS — J449 Chronic obstructive pulmonary disease, unspecified: Secondary | ICD-10-CM | POA: Diagnosis not present

## 2022-08-08 DIAGNOSIS — E782 Mixed hyperlipidemia: Secondary | ICD-10-CM | POA: Diagnosis not present

## 2022-08-08 DIAGNOSIS — G250 Essential tremor: Secondary | ICD-10-CM | POA: Diagnosis not present

## 2022-08-08 DIAGNOSIS — J45909 Unspecified asthma, uncomplicated: Secondary | ICD-10-CM | POA: Diagnosis not present

## 2022-08-08 DIAGNOSIS — N1831 Chronic kidney disease, stage 3a: Secondary | ICD-10-CM | POA: Diagnosis not present

## 2022-08-11 ENCOUNTER — Other Ambulatory Visit: Payer: Self-pay | Admitting: Cardiology

## 2022-08-16 ENCOUNTER — Encounter (HOSPITAL_COMMUNITY): Payer: Self-pay | Admitting: Emergency Medicine

## 2022-08-16 ENCOUNTER — Other Ambulatory Visit: Payer: Self-pay

## 2022-08-16 ENCOUNTER — Emergency Department (HOSPITAL_COMMUNITY)
Admission: EM | Admit: 2022-08-16 | Discharge: 2022-08-17 | Discharge status: Against Medical Advice | Payer: Medicare Other | Attending: Emergency Medicine | Admitting: Emergency Medicine

## 2022-08-16 DIAGNOSIS — I10 Essential (primary) hypertension: Secondary | ICD-10-CM | POA: Diagnosis not present

## 2022-08-16 DIAGNOSIS — E119 Type 2 diabetes mellitus without complications: Secondary | ICD-10-CM | POA: Diagnosis not present

## 2022-08-16 DIAGNOSIS — Z87891 Personal history of nicotine dependence: Secondary | ICD-10-CM | POA: Insufficient documentation

## 2022-08-16 DIAGNOSIS — R42 Dizziness and giddiness: Secondary | ICD-10-CM | POA: Diagnosis not present

## 2022-08-16 DIAGNOSIS — R531 Weakness: Secondary | ICD-10-CM | POA: Diagnosis not present

## 2022-08-16 DIAGNOSIS — I6789 Other cerebrovascular disease: Secondary | ICD-10-CM | POA: Diagnosis not present

## 2022-08-16 DIAGNOSIS — R262 Difficulty in walking, not elsewhere classified: Secondary | ICD-10-CM | POA: Diagnosis not present

## 2022-08-16 DIAGNOSIS — G459 Transient cerebral ischemic attack, unspecified: Secondary | ICD-10-CM | POA: Diagnosis not present

## 2022-08-16 DIAGNOSIS — Z743 Need for continuous supervision: Secondary | ICD-10-CM | POA: Diagnosis not present

## 2022-08-16 DIAGNOSIS — Z5321 Procedure and treatment not carried out due to patient leaving prior to being seen by health care provider: Secondary | ICD-10-CM | POA: Diagnosis not present

## 2022-08-16 NOTE — ED Triage Notes (Signed)
Pt BIB [facility] from home,c/o multiple falls at home over the last few days, denies LOC or hitting his head. States that at times his legs give out and he is unable to stand, when he attempts to stand he states he feels off balance. No other neuro deficits noted at this time. Pt does have a tremor.

## 2022-08-16 NOTE — ED Provider Triage Note (Signed)
Emergency Medicine Provider Triage Evaluation Note  Chase Gibson , a 66 y.o. male  was evaluated in triage.  Pt complains of difficulty with standing, states that started a few days ago, states that when he attempts to stand up just has to sit back down he states he feels off balance, no associated headaches change in vision paresthesias or weakness the upper or lower extremities, he states he has had a fall but denies hitting his head or losing conscious, he is not on anticoag's, history of diabetes, hypertension, hyperlipidemia, former smoker, no history of TIAs or strokes, states that he normally walks without any assistance.  Review of Systems  Positive: Dizziness, off-balance Negative: Chest pain, shortness of breath  Physical Exam  BP 136/83 (BP Location: Right Arm)   Pulse 85   Temp 98 F (36.7 C) (Oral)   Resp 16   SpO2 98%  Gen:   Awake, no distress   Resp:  Normal effort  MSK:   Moves extremities without difficulty  Other:  Cranial nerves II through XII are grossly intact there is no difficulty with word finding, following two-step commands, there is no unilateral weakness present, patient is able to ambulate but he appears to be favoring his left side it is slightly shuffled appears off balance.  Medical Decision Making  Medically screening exam initiated at 11:41 PM.  Appropriate orders placed.  Chase Gibson was informed that the remainder of the evaluation will be completed by another provider, this initial triage assessment does not replace that evaluation, and the importance of remaining in the ED until their evaluation is complete.  Will not initiate code stroke as he is outside the stroke window, will obtain basic lab work-up imaging.   Chase Fennel, PA-C 08/16/22 2342

## 2022-08-17 ENCOUNTER — Emergency Department (HOSPITAL_COMMUNITY): Payer: Medicare Other

## 2022-08-17 DIAGNOSIS — G459 Transient cerebral ischemic attack, unspecified: Secondary | ICD-10-CM | POA: Diagnosis not present

## 2022-08-17 LAB — CBC WITH DIFFERENTIAL/PLATELET
Abs Immature Granulocytes: 0.04 10*3/uL (ref 0.00–0.07)
Basophils Absolute: 0 10*3/uL (ref 0.0–0.1)
Basophils Relative: 1 %
Eosinophils Absolute: 0.3 10*3/uL (ref 0.0–0.5)
Eosinophils Relative: 6 %
HCT: 35.8 % — ABNORMAL LOW (ref 39.0–52.0)
Hemoglobin: 11.7 g/dL — ABNORMAL LOW (ref 13.0–17.0)
Immature Granulocytes: 1 %
Lymphocytes Relative: 36 %
Lymphs Abs: 1.9 10*3/uL (ref 0.7–4.0)
MCH: 30.4 pg (ref 26.0–34.0)
MCHC: 32.7 g/dL (ref 30.0–36.0)
MCV: 93 fL (ref 80.0–100.0)
Monocytes Absolute: 0.6 10*3/uL (ref 0.1–1.0)
Monocytes Relative: 12 %
Neutro Abs: 2.3 10*3/uL (ref 1.7–7.7)
Neutrophils Relative %: 44 %
Platelets: 146 10*3/uL — ABNORMAL LOW (ref 150–400)
RBC: 3.85 MIL/uL — ABNORMAL LOW (ref 4.22–5.81)
RDW: 14.3 % (ref 11.5–15.5)
WBC: 5.2 10*3/uL (ref 4.0–10.5)
nRBC: 0 % (ref 0.0–0.2)

## 2022-08-17 LAB — BASIC METABOLIC PANEL
Anion gap: 6 (ref 5–15)
BUN: 20 mg/dL (ref 8–23)
CO2: 26 mmol/L (ref 22–32)
Calcium: 9 mg/dL (ref 8.9–10.3)
Chloride: 110 mmol/L (ref 98–111)
Creatinine, Ser: 1.58 mg/dL — ABNORMAL HIGH (ref 0.61–1.24)
GFR, Estimated: 48 mL/min — ABNORMAL LOW (ref >60)
Glucose, Bld: 140 mg/dL — ABNORMAL HIGH (ref 70–99)
Potassium: 4.8 mmol/L (ref 3.5–5.1)
Sodium: 142 mmol/L (ref 135–145)

## 2022-08-17 LAB — PROTIME-INR
INR: 1 (ref 0.8–1.2)
Prothrombin Time: 13.5 seconds (ref 11.4–15.2)

## 2022-08-17 MED ORDER — OXYCODONE-ACETAMINOPHEN 5-325 MG PO TABS
1.0000 | ORAL_TABLET | Freq: Once | ORAL | Status: AC
  Administered 2022-08-17: 1 via ORAL
  Filled 2022-08-17: qty 1

## 2022-08-17 NOTE — ED Notes (Signed)
Called pt x3 for urine sample, no response.

## 2022-08-17 NOTE — Progress Notes (Signed)
@  840AM PT REFUSED MRI EXAM , DUE TO NOBODY IS TELLING HIM WHATS GOING ON , ATTEMPTED TO REACH OUT TO RN NO RESPONSE

## 2022-08-21 ENCOUNTER — Emergency Department (HOSPITAL_COMMUNITY): Payer: Medicare Other

## 2022-08-21 ENCOUNTER — Emergency Department (HOSPITAL_COMMUNITY)
Admission: EM | Admit: 2022-08-21 | Discharge: 2022-08-22 | Discharge status: Against Medical Advice | Payer: Medicare Other | Attending: Medical | Admitting: Medical

## 2022-08-21 ENCOUNTER — Encounter (HOSPITAL_COMMUNITY): Payer: Self-pay

## 2022-08-21 DIAGNOSIS — T50905A Adverse effect of unspecified drugs, medicaments and biological substances, initial encounter: Secondary | ICD-10-CM | POA: Insufficient documentation

## 2022-08-21 DIAGNOSIS — Z743 Need for continuous supervision: Secondary | ICD-10-CM | POA: Diagnosis not present

## 2022-08-21 DIAGNOSIS — R531 Weakness: Secondary | ICD-10-CM | POA: Diagnosis not present

## 2022-08-21 DIAGNOSIS — S0990XA Unspecified injury of head, initial encounter: Secondary | ICD-10-CM | POA: Insufficient documentation

## 2022-08-21 DIAGNOSIS — R6889 Other general symptoms and signs: Secondary | ICD-10-CM | POA: Diagnosis not present

## 2022-08-21 DIAGNOSIS — Z5321 Procedure and treatment not carried out due to patient leaving prior to being seen by health care provider: Secondary | ICD-10-CM | POA: Insufficient documentation

## 2022-08-21 DIAGNOSIS — T887XXA Unspecified adverse effect of drug or medicament, initial encounter: Secondary | ICD-10-CM | POA: Diagnosis not present

## 2022-08-21 DIAGNOSIS — Z043 Encounter for examination and observation following other accident: Secondary | ICD-10-CM | POA: Diagnosis not present

## 2022-08-21 DIAGNOSIS — W01198A Fall on same level from slipping, tripping and stumbling with subsequent striking against other object, initial encounter: Secondary | ICD-10-CM | POA: Diagnosis not present

## 2022-08-21 DIAGNOSIS — R251 Tremor, unspecified: Secondary | ICD-10-CM | POA: Insufficient documentation

## 2022-08-21 DIAGNOSIS — F32A Depression, unspecified: Secondary | ICD-10-CM | POA: Diagnosis not present

## 2022-08-21 DIAGNOSIS — W19XXXA Unspecified fall, initial encounter: Secondary | ICD-10-CM | POA: Diagnosis not present

## 2022-08-21 DIAGNOSIS — M25561 Pain in right knee: Secondary | ICD-10-CM | POA: Diagnosis not present

## 2022-08-21 LAB — COMPREHENSIVE METABOLIC PANEL
ALT: 12 U/L (ref 0–44)
AST: 22 U/L (ref 15–41)
Albumin: 3.3 g/dL — ABNORMAL LOW (ref 3.5–5.0)
Alkaline Phosphatase: 95 U/L (ref 38–126)
Anion gap: 6 (ref 5–15)
BUN: 24 mg/dL — ABNORMAL HIGH (ref 8–23)
CO2: 26 mmol/L (ref 22–32)
Calcium: 8.8 mg/dL — ABNORMAL LOW (ref 8.9–10.3)
Chloride: 104 mmol/L (ref 98–111)
Creatinine, Ser: 1.5 mg/dL — ABNORMAL HIGH (ref 0.61–1.24)
GFR, Estimated: 51 mL/min — ABNORMAL LOW (ref >60)
Glucose, Bld: 78 mg/dL (ref 70–99)
Potassium: 4.7 mmol/L (ref 3.5–5.1)
Sodium: 136 mmol/L (ref 135–145)
Total Bilirubin: 0.4 mg/dL (ref 0.3–1.2)
Total Protein: 7.2 g/dL (ref 6.5–8.1)

## 2022-08-21 LAB — CBC WITH DIFFERENTIAL/PLATELET
Abs Immature Granulocytes: 0.01 10*3/uL (ref 0.00–0.07)
Basophils Absolute: 0.1 10*3/uL (ref 0.0–0.1)
Basophils Relative: 1 %
Eosinophils Absolute: 0.3 10*3/uL (ref 0.0–0.5)
Eosinophils Relative: 6 %
HCT: 37.8 % — ABNORMAL LOW (ref 39.0–52.0)
Hemoglobin: 12.1 g/dL — ABNORMAL LOW (ref 13.0–17.0)
Immature Granulocytes: 0 %
Lymphocytes Relative: 41 %
Lymphs Abs: 2.1 10*3/uL (ref 0.7–4.0)
MCH: 30 pg (ref 26.0–34.0)
MCHC: 32 g/dL (ref 30.0–36.0)
MCV: 93.6 fL (ref 80.0–100.0)
Monocytes Absolute: 0.4 10*3/uL (ref 0.1–1.0)
Monocytes Relative: 8 %
Neutro Abs: 2.3 10*3/uL (ref 1.7–7.7)
Neutrophils Relative %: 44 %
Platelets: 170 10*3/uL (ref 150–400)
RBC: 4.04 MIL/uL — ABNORMAL LOW (ref 4.22–5.81)
RDW: 13.8 % (ref 11.5–15.5)
WBC: 5.2 10*3/uL (ref 4.0–10.5)
nRBC: 0 % (ref 0.0–0.2)

## 2022-08-21 NOTE — ED Triage Notes (Addendum)
Pt arrived via EMS, has been taking primidone for tremors, on Tuesday seen at [place], had increased in dosage. Since increase, pt states he has felt unbalanced with standing. Has had multiple falls since increase. Had been seen at [facility], had CT, recommended MRI but left before completion.    Medication prescribed  250 mg, TID. Has been taking '750mg'$  pills x3 times a day due to confusion on dosage.   Last dose- yesterday- 2 pills morning, 2 pills afternoon, and one at night  Hit head with falls, no thinners no LOC. C/o right knee pain.

## 2022-08-21 NOTE — ED Provider Triage Note (Signed)
Emergency Medicine Provider Triage Evaluation Note  Chase Gibson , a 66 y.o. male  was evaluated in triage.  Pt complains of taking 3x as much primidone for the last few days that has caused frequent falls. Sent by provider at [facility] to be tested for primidone level and barbituates. Fell on R knee, and hit head. -BT, -LOC.  Review of Systems  Positive: Weakness, tremor Negative: Urinary symptoms  Physical Exam  BP 123/65   Pulse 82   Temp 98.1 F (36.7 C) (Oral)   Resp 14   SpO2 94%  Gen:   Awake, no distress, tremor at rest Resp:  Normal effort  MSK:   Moves extremities without difficulty , mild ttp of R anterior knee  Medical Decision Making  Medically screening exam initiated at 5:10 PM.  Appropriate orders placed.  Chase Gibson was informed that the remainder of the evaluation will be completed by another provider, this initial triage assessment does not replace that evaluation, and the importance of remaining in the ED until their evaluation is complete.     Osvaldo Shipper, [place] 08/21/22 1720

## 2022-08-24 LAB — PRIMIDONE, SERUM
Phenobarbital: 36 ug/mL (ref 15–40)
Primidone, Serum: 11.3 ug/mL (ref 5.0–12.0)

## 2022-08-29 ENCOUNTER — Encounter: Payer: Self-pay | Admitting: Physical Medicine & Rehabilitation

## 2022-09-04 ENCOUNTER — Telehealth: Payer: Self-pay

## 2022-09-04 NOTE — Telephone Encounter (Signed)
     Patient  visit on 11/1  at [facility]   Have you been able to follow up with your primary care physician? YES  The patient was or was not able to obtain any needed medicine or equipment. YES  Are there diet recommendations that you are having difficulty following? NA  Patient expresses understanding of discharge instructions and education provided has no other needs at this time.  [place], [facility], Care Management  [phone] [address], [place], [place] [address] Phone: [phone] Email: [email or web address]

## 2022-09-11 DIAGNOSIS — E875 Hyperkalemia: Secondary | ICD-10-CM | POA: Diagnosis not present

## 2022-09-25 DIAGNOSIS — E875 Hyperkalemia: Secondary | ICD-10-CM | POA: Diagnosis not present

## 2022-10-09 ENCOUNTER — Encounter: Payer: Medicare Other | Admitting: Physical Medicine & Rehabilitation

## 2022-10-13 DIAGNOSIS — J40 Bronchitis, not specified as acute or chronic: Secondary | ICD-10-CM | POA: Diagnosis not present

## 2022-10-13 DIAGNOSIS — J449 Chronic obstructive pulmonary disease, unspecified: Secondary | ICD-10-CM | POA: Diagnosis not present

## 2022-10-13 DIAGNOSIS — J069 Acute upper respiratory infection, unspecified: Secondary | ICD-10-CM | POA: Diagnosis not present

## 2022-10-13 DIAGNOSIS — J45909 Unspecified asthma, uncomplicated: Secondary | ICD-10-CM | POA: Diagnosis not present

## 2022-11-13 ENCOUNTER — Encounter: Payer: Medicare Other | Attending: Physical Medicine & Rehabilitation | Admitting: Physical Medicine & Rehabilitation

## 2023-01-22 DIAGNOSIS — M79641 Pain in right hand: Secondary | ICD-10-CM | POA: Diagnosis not present

## 2023-01-25 DIAGNOSIS — M79641 Pain in right hand: Secondary | ICD-10-CM | POA: Diagnosis not present

## 2023-01-30 ENCOUNTER — Emergency Department (HOSPITAL_COMMUNITY): Payer: Medicare Other

## 2023-01-30 ENCOUNTER — Emergency Department (HOSPITAL_COMMUNITY)
Admission: EM | Admit: 2023-01-30 | Discharge: 2023-01-30 | Disposition: A | Discharge status: Home / Self Care | Payer: Medicare Other | Attending: Emergency Medicine | Admitting: Emergency Medicine

## 2023-01-30 ENCOUNTER — Other Ambulatory Visit: Payer: Self-pay

## 2023-01-30 ENCOUNTER — Encounter (HOSPITAL_COMMUNITY): Payer: Self-pay

## 2023-01-30 ENCOUNTER — Emergency Department (HOSPITAL_BASED_OUTPATIENT_CLINIC_OR_DEPARTMENT_OTHER): Payer: Medicare Other

## 2023-01-30 DIAGNOSIS — E1022 Type 1 diabetes mellitus with diabetic chronic kidney disease: Secondary | ICD-10-CM | POA: Diagnosis not present

## 2023-01-30 DIAGNOSIS — I82622 Acute embolism and thrombosis of deep veins of left upper extremity: Secondary | ICD-10-CM

## 2023-01-30 DIAGNOSIS — R52 Pain, unspecified: Secondary | ICD-10-CM

## 2023-01-30 DIAGNOSIS — R918 Other nonspecific abnormal finding of lung field: Secondary | ICD-10-CM | POA: Diagnosis not present

## 2023-01-30 DIAGNOSIS — R2232 Localized swelling, mass and lump, left upper limb: Secondary | ICD-10-CM | POA: Diagnosis present

## 2023-01-30 DIAGNOSIS — N183 Chronic kidney disease, stage 3 unspecified: Secondary | ICD-10-CM | POA: Diagnosis not present

## 2023-01-30 DIAGNOSIS — I2510 Atherosclerotic heart disease of native coronary artery without angina pectoris: Secondary | ICD-10-CM | POA: Diagnosis not present

## 2023-01-30 DIAGNOSIS — I878 Other specified disorders of veins: Secondary | ICD-10-CM | POA: Diagnosis not present

## 2023-01-30 DIAGNOSIS — Z7901 Long term (current) use of anticoagulants: Secondary | ICD-10-CM | POA: Insufficient documentation

## 2023-01-30 DIAGNOSIS — M7989 Other specified soft tissue disorders: Secondary | ICD-10-CM

## 2023-01-30 DIAGNOSIS — J449 Chronic obstructive pulmonary disease, unspecified: Secondary | ICD-10-CM | POA: Diagnosis not present

## 2023-01-30 LAB — CBC WITH DIFFERENTIAL/PLATELET
Abs Immature Granulocytes: 0.02 10*3/uL (ref 0.00–0.07)
Basophils Absolute: 0.1 10*3/uL (ref 0.0–0.1)
Basophils Relative: 1 %
Eosinophils Absolute: 0.4 10*3/uL (ref 0.0–0.5)
Eosinophils Relative: 8 %
HCT: 35.2 % — ABNORMAL LOW (ref 39.0–52.0)
Hemoglobin: 11.7 g/dL — ABNORMAL LOW (ref 13.0–17.0)
Immature Granulocytes: 0 %
Lymphocytes Relative: 39 %
Lymphs Abs: 1.9 10*3/uL (ref 0.7–4.0)
MCH: 30.7 pg (ref 26.0–34.0)
MCHC: 33.2 g/dL (ref 30.0–36.0)
MCV: 92.4 fL (ref 80.0–100.0)
Monocytes Absolute: 0.4 10*3/uL (ref 0.1–1.0)
Monocytes Relative: 8 %
Neutro Abs: 2.1 10*3/uL (ref 1.7–7.7)
Neutrophils Relative %: 44 %
Platelets: 162 10*3/uL (ref 150–400)
RBC: 3.81 MIL/uL — ABNORMAL LOW (ref 4.22–5.81)
RDW: 13.6 % (ref 11.5–15.5)
WBC: 4.8 10*3/uL (ref 4.0–10.5)
nRBC: 0 % (ref 0.0–0.2)

## 2023-01-30 LAB — BASIC METABOLIC PANEL
Anion gap: 5 (ref 5–15)
BUN: 27 mg/dL — ABNORMAL HIGH (ref 8–23)
CO2: 21 mmol/L — ABNORMAL LOW (ref 22–32)
Calcium: 8.8 mg/dL — ABNORMAL LOW (ref 8.9–10.3)
Chloride: 107 mmol/L (ref 98–111)
Creatinine, Ser: 1.3 mg/dL — ABNORMAL HIGH (ref 0.61–1.24)
GFR, Estimated: >60 mL/min (ref >60)
Glucose, Bld: 171 mg/dL — ABNORMAL HIGH (ref 70–99)
Potassium: 4.8 mmol/L (ref 3.5–5.1)
Sodium: 133 mmol/L — ABNORMAL LOW (ref 135–145)

## 2023-01-30 LAB — TROPONIN I (HIGH SENSITIVITY): Troponin I (High Sensitivity): 3 ng/L (ref <18)

## 2023-01-30 MED ORDER — APIXABAN (ELIQUIS) EDUCATION KIT FOR DVT/PE PATIENTS: PACK | Freq: Once | Status: DC

## 2023-01-30 MED ORDER — APIXABAN (ELIQUIS) VTE STARTER PACK (10MG AND 5MG): ORAL_TABLET | ORAL | 0 refills | Status: DC

## 2023-01-30 MED ORDER — APIXABAN 5 MG PO TABS
10.0000 mg | ORAL_TABLET | Freq: Once | ORAL | Status: AC
  Administered 2023-01-30: 10 mg via ORAL
  Filled 2023-01-30: qty 2

## 2023-01-30 NOTE — ED Triage Notes (Signed)
Swelling and bruising noted to left arm x3 days.  Denies blood thinner usage Denies fall/trauma

## 2023-01-30 NOTE — Progress Notes (Signed)
LUE venous duplex has been completed.  Preliminary results given to Evlyn Kanner, PA-C.   Results can be found under chart review under CV PROC. 01/30/2023 4:46 PM Yafet Cline RVT, RDMS

## 2023-01-30 NOTE — ED Provider Triage Note (Signed)
Emergency Medicine Provider Triage Evaluation Note  Chase Gibson , a 67 y.o. male  was evaluated in triage.  Pt complains of left arm swelling that began 3 days ago.  Patient stated he got out of the shower and noticed that his left arm had a bruise near his left axilla that has progressed to his whole arm becoming swollen.  Patient denies any change in sensation/motor skills, blood thinners, fall/trauma, recent IV use.  Patient states has a history of NSTEMI but has not been on blood thinners for 2 years.  Patient states he went to the urgent care and referred here for possible blood thinners and ultrasound to rule out a DVT.  Patient states the swelling is nonpainful but when he flexes his left elbow he notices some pressure in his bicep.  Patient has also noticed spider veins along his proximal left upper arm  Patient had chest pain, shortness of breath, vision changes, nausea/vomiting, change in sensation-motor skills  Review of Systems  Positive: See HPI Negative: See HPI  Physical Exam  BP (!) 153/88 (BP Location: Right Arm)   Pulse 66   Temp 97.9 F (36.6 C) (Oral)   Resp 18   Wt 72 kg   SpO2 98%   BMI 22.78 kg/m  Gen:   Awake, no distress   Resp:  Normal effort  MSK:   Moves extremities without difficulty  Other:  Is motor and sensation intact in left arm, left arm does appear swollen as compared to right arm, left arm nontender to palpation spider veins noted along left axilla  Medical Decision Making  Medically screening exam initiated at 3:37 PM.  Appropriate orders placed.  Lan Hodgson Kaucher was informed that the remainder of the evaluation will be completed by another provider, this initial triage assessment does not replace that evaluation, and the importance of remaining in the ED until their evaluation is complete.  Workup initiated, patient stable at this time   Remi Deter 01/30/23 1553

## 2023-01-30 NOTE — Discharge Instructions (Addendum)
Your lab work did not reveal any acute pathology.  You do have a left upper extremity DVT as we discussed.  Surgery on Eliquis as we discussed.  Please follow with your primary care provider.  Additionally I referred you to the DVT clinic for further diagnostic care and management.  Thank accepted to participate in care, Glyn Ade MD

## 2023-01-30 NOTE — ED Provider Notes (Signed)
[place] EMERGENCY DEPARTMENT AT [facility] Provider Note   CSN: [id number] Arrival date & time: 01/30/23  1508     History Chief Complaint  Patient presents with   arm swelling    HPI Chase Gibson is a 67 y.o. male presenting for days of left upper extremity swelling currently not on anticoagulation. CKD stage III patient, COPD, insulin-dependent diabetes, hyperlipidemia, history of CAD.  Patient's recorded medical, surgical, social, medication list and allergies were reviewed in the Snapshot window as part of the initial history.   Review of Systems   Review of Systems  Constitutional:  Negative for chills and fever.  HENT:  Negative for ear pain and sore throat.   Eyes:  Negative for pain and visual disturbance.  Respiratory:  Negative for cough and shortness of breath.   Cardiovascular:  Negative for chest pain and palpitations.  Gastrointestinal:  Negative for abdominal pain and vomiting.  Genitourinary:  Negative for dysuria and hematuria.  Musculoskeletal:  Negative for arthralgias and back pain.  Skin:  Negative for color change and rash.  Neurological:  Negative for seizures and syncope.  All other systems reviewed and are negative.   Physical Exam Updated Vital Signs BP (!) 153/88 (BP Location: Right Arm)   Pulse 66   Temp 97.9 F (36.6 C) (Oral)   Resp 18   Wt 72 kg   SpO2 98%   BMI 22.78 kg/m  Physical Exam Vitals and nursing note reviewed.  Constitutional:      General: He is not in acute distress.    Appearance: He is well-developed.  HENT:     Head: Normocephalic and atraumatic.  Eyes:     Conjunctiva/sclera: Conjunctivae normal.  Cardiovascular:     Rate and Rhythm: Normal rate and regular rhythm.     Heart sounds: No murmur heard. Pulmonary:     Effort: Pulmonary effort is normal. No respiratory distress.     Breath sounds: Normal breath sounds.  Abdominal:     Palpations: Abdomen is soft.     Tenderness: There is no  abdominal tenderness.  Musculoskeletal:        General: Swelling (Mild LUE swelling) present.     Cervical back: Neck supple.  Skin:    General: Skin is warm and dry.     Capillary Refill: Capillary refill takes less than 2 seconds.  Neurological:     Mental Status: He is alert.  Psychiatric:        Mood and Affect: Mood normal.      ED Course/ Medical Decision Making/ A&P    Procedures Procedures   Medications Ordered in ED Medications  apixaban (ELIQUIS) tablet 10 mg (has no administration in time range)    Medical Decision Making:    Chase Gibson is a 67 y.o. male who presented to the ED today with isolated left upper extremity swelling detailed above.    Patient's history present illness and physical exam findings raise concern for DVT.  Also considered cellulitis, injury, fracture however the seem less consistent.  Considered progression to PE however this also seems less consistent with his presentation given lack of chest pain or shortness of breath. Ultrasound performed that identified DVT in the left upper extremity. Patient will be started on anticoagulation.  Laboratory studies ordered to evaluate for most appropriate anticoagulation decision. Laboratory studies demonstrated no acute pathology.. EKG/troponin ordered in triage to evaluate for cardiac abnormality as a cause for the left upper extremity abnormality: resulted negative.  Disposition:  I have considered need for hospitalization, however, considering all of the above, I believe this patient is stable for discharge at this time.  Patient/family educated about specific return precautions for given chief complaint and symptoms.  Patient/family educated about follow-up with PCP.     Patient/family expressed understanding of return precautions and need for follow-up. Patient spoken to regarding all imaging and laboratory results and appropriate follow up for these results. All education provided in verbal  form with additional information in written form. Time was allowed for answering of patient questions. Patient discharged.    Emergency Department Medication Summary:   Medications  apixaban (ELIQUIS) tablet 10 mg (has no administration in time range)        Clinical Impression:  1. Acute deep vein thrombosis (DVT) of other vein of left upper extremity      Discharge   Final Clinical Impression(s) / ED Diagnoses Final diagnoses:  Acute deep vein thrombosis (DVT) of other vein of left upper extremity    Rx / DC Orders ED Discharge Orders          Ordered    AMB Referral to Deep Vein Thrombosis Clinic       Comments: Please call [phone] with any questions.   01/30/23 1703    APIXABAN (ELIQUIS) VTE STARTER PACK (10MG  AND 5MG )        01/30/23 1703    APIXABAN (ELIQUIS) VTE STARTER PACK (10MG  AND 5MG )        01/30/23 1704              Glyn Adeountryman, Ariahna Smiddy, MD 01/30/23 1737

## 2023-02-06 DIAGNOSIS — I82622 Acute embolism and thrombosis of deep veins of left upper extremity: Secondary | ICD-10-CM | POA: Diagnosis not present

## 2023-02-06 DIAGNOSIS — G250 Essential tremor: Secondary | ICD-10-CM | POA: Diagnosis not present

## 2023-02-06 DIAGNOSIS — D696 Thrombocytopenia, unspecified: Secondary | ICD-10-CM | POA: Diagnosis not present

## 2023-02-06 DIAGNOSIS — J45909 Unspecified asthma, uncomplicated: Secondary | ICD-10-CM | POA: Diagnosis not present

## 2023-02-06 DIAGNOSIS — E782 Mixed hyperlipidemia: Secondary | ICD-10-CM | POA: Diagnosis not present

## 2023-02-06 DIAGNOSIS — E1122 Type 2 diabetes mellitus with diabetic chronic kidney disease: Secondary | ICD-10-CM | POA: Diagnosis not present

## 2023-02-06 DIAGNOSIS — M25562 Pain in left knee: Secondary | ICD-10-CM | POA: Diagnosis not present

## 2023-02-06 DIAGNOSIS — J449 Chronic obstructive pulmonary disease, unspecified: Secondary | ICD-10-CM | POA: Diagnosis not present

## 2023-02-06 DIAGNOSIS — I2510 Atherosclerotic heart disease of native coronary artery without angina pectoris: Secondary | ICD-10-CM | POA: Diagnosis not present

## 2023-02-06 DIAGNOSIS — N1831 Chronic kidney disease, stage 3a: Secondary | ICD-10-CM | POA: Diagnosis not present

## 2023-02-06 DIAGNOSIS — K219 Gastro-esophageal reflux disease without esophagitis: Secondary | ICD-10-CM | POA: Diagnosis not present

## 2023-02-07 ENCOUNTER — Encounter (HOSPITAL_COMMUNITY): Payer: Medicare Other

## 2023-02-13 DIAGNOSIS — M79641 Pain in right hand: Secondary | ICD-10-CM | POA: Diagnosis not present

## 2023-02-14 ENCOUNTER — Encounter (HOSPITAL_COMMUNITY): Payer: Self-pay

## 2023-02-14 ENCOUNTER — Ambulatory Visit (HOSPITAL_COMMUNITY)
Admission: RE | Admit: 2023-02-14 | Discharge: 2023-02-14 | Disposition: A | Discharge status: Home / Self Care | Payer: Medicare Other | Source: Ambulatory Visit | Attending: Vascular Surgery | Admitting: Vascular Surgery

## 2023-02-14 VITALS — BP 125/66 | HR 63

## 2023-02-14 DIAGNOSIS — I82409 Acute embolism and thrombosis of unspecified deep veins of unspecified lower extremity: Secondary | ICD-10-CM | POA: Insufficient documentation

## 2023-02-14 DIAGNOSIS — I82622 Acute embolism and thrombosis of deep veins of left upper extremity: Secondary | ICD-10-CM | POA: Diagnosis not present

## 2023-02-14 MED ORDER — APIXABAN 5 MG PO TABS: 5.0000 mg | ORAL_TABLET | Freq: Two times a day (BID) | ORAL | 1 refills | Status: DC

## 2023-02-14 NOTE — Progress Notes (Signed)
DVT Clinic Note  Name: Chase Gibson     MRN: [id number]     DOB: [birth year]     Sex: male  PCP: Tally Joe, MD  Today's Visit: Visit Information: Initial Visit  Referred to DVT Clinic by: Dr. Doran Durand [facility])  Referred to CPP by: Dr. Chestine Spore Reason for referral:  Chief Complaint  Patient presents with   Med Management - DVT   HISTORY OF PRESENT ILLNESS:  Chase Gibson is a 66 y.o. male with PMH NSTEMI s/p LAD BMS 02/2015, HTN, T2DM, HLD, CKD, COPD, former smoker (quit 10/03/2021), who presents after diagnosis of DVT for medication management. Patient presented to the ED on 01/30/23 with complaint of LUE swelling for 3 days. He was found to have a DVT involving the left subclavian vein, left axillary vein and left brachial veins. He was started on an Eliquis starter pack and aspirin was discontinued.   Today, the patient reports that the swelling in his L arm has resolved. He never experienced any pain. Reports he noticed "spider veins" appear on both sides of his upper arm when the swelling started. These veins have lightened in color over the past 2 weeks. He endorses a L shoulder injury while playing with his grandson about 2 weeks prior to DVT symptom onset. His arm was pulled back behind him and he felt his shoulder pop like it was pulled out of joint. He had accompanying pain of the shoulder but this resolved. No family history of VTE. He is very active. Denies repetitive overhead motions of his arms above his head. 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   Positive Thrombotic Risk Factors: Recent trauma (within 3 months) Bleeding Risk Factors: Anticoagulant therapy  Negative Thrombotic Risk Factors: Previous VTE, Recent surgery (within 3 months), Recent admission to hospital with acute illness (within 3 months), Central venous catheterization, Paralysis, paresis, or recent plaster cast  immobilization of lower extremity, Testosterone therapy, Older age, Obesity, Smoking, Known thrombophilic condition, Non-malignant, chronic inflammatory condition, Recent COVID diagnosis (within 3 months), Pregnancy, Estrogen therapy, Bed rest >72 hours within 3 months, Recent cesarean section (within 3 months), Within 6 weeks postpartum, Erythropoiesis-stimulating agent, Active cancer, Sedentary journey lasting >8 hours within 4 weeks  Rx Insurance Coverage: Medicare + Extra Help Rx Affordability: Due to Medicare Extra Help, copay for Eliquis is $11.20.  Preferred Pharmacy: Refills sent to [facility] on [facility] and [place]   Past Medical History:  Diagnosis Date   ALLERGIC RHINITIS 03/18/2009   ANXIETY 06/24/2007   pt denies at PAT visit on 02/25/15   ASTHMA 06/24/2007   BREAST PAIN, LEFT 07/21/2008   CAD S/P percutaneous coronary angioplasty 03/11/2015   SEVERE SINGLE VESSEL CAD OF THE PROX LAD - 95%--> 0% post PCI Vision BMS 3.5 x 18 (3.75 mm); MILD-MODERATE (~50%) NONOBSTRUCTIVE LCX AND RCA STENOSES    CKD (chronic kidney disease), stage III    Colon polyps 03/28/2012   COPD (chronic obstructive pulmonary disease) 03/28/2012   DEPRESSION 06/24/2007   DIABETES MELLITUS, TYPE II 07/21/2008   GERD 08/25/2008   pt denies at PAT visit on 02/25/15   History of non-ST elevation myocardial infarction (NSTEMI) 03/11/2015   Mild Troponin ~0.45, transient Anterior ST-T changes; LAD stenosis treated with BMS   HYPERLIPIDEMIA 07/21/2008   INSOMNIA-SLEEP DISORDER-UNSPEC 03/28/2010   LOW BACK PAIN 09/03/2007   Myocardial infarction    NAUSEA 06/14/2010  OSTEOARTHRITIS, KNEES, BILATERAL 08/25/2008   PERIPHERAL EDEMA 07/21/2008   SYMPTOM, ABNORMAL INVOLUNTARY MOVEMENT NEC 06/21/2007   Tobacco abuse    Vertigo 11/2014   pt has a bout every 3-4 months, no pattern   Wheezing 07/13/2009    Past Surgical History:  Procedure Laterality Date   APPENDECTOMY     CARDIAC CATHETERIZATION N/A 03/10/2015   Procedure: Left Heart  Cath and Coronary Angiography;  Surgeon: Tonny Bollman, MD;  Location: [facility] INVASIVE CV LAB;  Service: Cardiovascula: EVERE SINGLE VESSEL CAD OF THE PROX LAD - 95%--> BMS PCI. MILD-MODERATE (~50%) NONOBSTRUCTIVE LCX AND RCA STENOSES    CARDIAC CATHETERIZATION N/A 03/10/2015   Procedure: Coronary Stent Intervention;  Surgeon: Tonny Bollman, MD;  Location: [facility] INVASIVE CV LAB: PROX LAD - 95%--> 0% post PCI Vision BMS 3.5 x 18 (3.75 mm) - BMS used b/c upcoming knee Sgx.   CHOLECYSTECTOMY     inguinal herniorrhapy Bilateral    1979 & 1997   KNEE ARTHROSCOPY Left    left   KNEE ARTHROSCOPY Left 09/17/2017   Procedure: ARTHROSCOPY KNEE, PATELLA FEMORAL  CHONDROPLASTY, PARTIAL MEDIAL MENISECTOMY;  Surgeon: Donato Heinz, MD;  Location: [facility] ORS;  Service: Orthopedics;  Laterality: Left;   Removal of right total knee implants for periprosthetic infection  10/02/2014   Right knee arthrotomy, debridement, and reinsertion of antibiotic cement spacer  12/18/2014   right knee surgury     x 9   ROTATOR CUFF REPAIR Bilateral    bilateral   TONSILLECTOMY     TOTAL KNEE ARTHROPLASTY Right 05/17/2015   Procedure: TOTAL KNEE ARTHROPLASTY, REMOVAL OF SPACER;  Surgeon: Donato Heinz, MD;  Location: [facility] ORS;  Service: Orthopedics;  Laterality: Right;   TRANSTHORACIC ECHOCARDIOGRAM  03/10/2015   EF 65 and 70%. No RWMA. ~ normal diastolic parameters    Social History   Socioeconomic History   Marital status: Divorced    Spouse name: Not on file   Number of children: 2   Years of education: Not on file   Highest education level: Not on file  Occupational History   Not on file  Tobacco Use   Smoking status: Former    Packs/day: 1.00    Years: 35.00    Additional pack years: 0.00    Total pack years: 35.00    Types: Cigarettes    Quit date: 2018    Years since quitting: 6.3   Smokeless tobacco: Former  Building services engineer Use: Some days  Substance and Sexual Activity   Alcohol use: No   Drug use:  No   Sexual activity: Not on file  Other Topics Concern   Not on file  Social History Narrative   Works as a Mudlogger. Was actually not working at the time of his MI because of pain from his knee. He was scheduled to have right knee total replacement the week after his MI.   Former long-term smoker, who quit following his MI.   He has a huge Holiday representative with tattoos   Social Determinants of Health   Financial Resource Strain: Not on file  Food Insecurity: Not on file  Transportation Needs: Not on file  Physical Activity: Not on file  Stress: Not on file  Social Connections: Not on file  Intimate Partner Violence: Not on file    Family History  Problem Relation Age of Onset   Coronary artery disease Other    Emphysema Mother  never smoker but spouse smoked   Allergies Son    Asthma Maternal Grandmother    Rheum arthritis Sister    Lung cancer Father     Allergies as of 02/14/2023 - Review Complete 02/14/2023  Allergen Reaction Noted   Nsaids Nausea And Vomiting, Rash, Dermatitis, Hives, and Shortness Of Breath 01/20/2014   Propranolol Other (See Comments) 06/07/2022   Ibuprofen Hives 10/14/2015   Varenicline Other (See Comments) 10/14/2015   Tramadol Itching 10/16/2014    Current Outpatient Medications on File Prior to Encounter  Medication Sig Dispense Refill   albuterol (VENTOLIN HFA) 108 (90 BASE) MCG/ACT inhaler Inhale 1-2 puffs into the lungs every 4 (four) hours as needed for wheezing or shortness of breath.      APIXABAN (ELIQUIS) VTE STARTER PACK (10MG  AND 5MG ) Take as directed on package: start with two-5mg  tablets twice daily for 7 days. On day 8, switch to one-5mg  tablet twice daily. 1 each 0   atorvastatin (LIPITOR) 80 MG tablet Take 0.5-1 tablets (40-80 mg total) by mouth daily. SCHEDULE OFFICE VISIT FOR FUTURE REFILLS. 15 tablet 0   cyclobenzaprine (FLEXERIL) 10 MG tablet Take 10 mg by mouth 3 (three) times daily as needed for muscle spasms.      divalproex (DEPAKOTE) 250 MG DR tablet Take 750 mg by mouth daily. Take 3 tablets by mouth daily     HYDROcodone-acetaminophen (NORCO) 10-325 MG per tablet Take 1 tablet by mouth every 6 (six) hours as needed for moderate pain.     insulin glargine (LANTUS SOLOSTAR) 100 UNIT/ML Solostar Pen Inject 20 Units into the skin daily.     losartan (COZAAR) 25 MG tablet Take 25 mg by mouth daily.   1   meclizine (ANTIVERT) 12.5 MG tablet Take 12.5 mg by mouth 3 (three) times daily as needed for dizziness.     metFORMIN (GLUCOPHAGE) 500 MG tablet Take 1,000 mg by mouth daily.     montelukast (SINGULAIR) 10 MG tablet Take 10 mg daily by mouth.      Oxycodone HCl 10 MG TABS Uses as needed for severe pain if hydrocodone not helping     primidone (MYSOLINE) 50 MG tablet Take 1 tablet (50 mg total) by mouth 2 (two) times daily. 60 tablet 0   QUEtiapine (SEROQUEL) 100 MG tablet Take 300 mg at bedtime by mouth.   1   roflumilast (DALIRESP) 500 MCG TABS tablet Take 1 tablet (500 mcg total) by mouth daily. 90 tablet 3   nitroGLYCERIN (NITROSTAT) 0.4 MG SL tablet Place 1 tablet (0.4 mg total) under the tongue every 5 (five) minutes as needed for chest pain. 25 tablet 6   Current Facility-Administered Medications on File Prior to Encounter  Medication Dose Route Frequency Provider Last Rate Last Admin   0.9 %  sodium chloride infusion  500 mL Intravenous Continuous Danis, Starr Lake III, MD       REVIEW OF SYSTEMS:  Review of Systems  Respiratory:  Negative for shortness of breath.   Cardiovascular:  Negative for chest pain and palpitations.  Musculoskeletal:  Negative for myalgias.  Neurological:  Negative for dizziness and tingling.   PHYSICAL EXAMINATION:  Vitals:   02/14/23 1418  BP: 125/66  Pulse: 63  SpO2: 98%   Physical Exam Vitals reviewed.  Cardiovascular:     Rate and Rhythm: Normal rate.  Pulmonary:     Effort: Pulmonary effort is normal.  Musculoskeletal:        General: No swelling or  tenderness.  Skin:    Findings: No erythema.     Comments: Varicose veins along the medial and lateral L upper arm    Villalta Score for Post-Thrombotic Syndrome: Pain: Absent Cramps: Absent Heaviness: Absent Paresthesia: Absent Pruritus: Absent Pretibial Edema: Absent Skin Induration: Absent Hyperpigmentation: Absent Redness: Absent Venous Ectasia: Moderate Pain on calf compression: Absent Villalta Preliminary Score: 2 Is venous ulcer present?: No If venous ulcer is present and score is <15, then 15 points total are assigned: Absent Villalta Total Score: 2  LABS:  CBC     Component Value Date/Time   WBC 4.8 01/30/2023 1611   RBC 3.81 (L) 01/30/2023 1611   HGB 11.7 (L) 01/30/2023 1611   HGB 8.1 (L) 10/05/2014 0341   HCT 35.2 (L) 01/30/2023 1611   HCT 22.3 (L) 10/04/2014 0359   PLT 162 01/30/2023 1611   PLT 140 (L) 10/04/2014 0359   MCV 92.4 01/30/2023 1611   MCV 91 10/04/2014 0359   MCH 30.7 01/30/2023 1611   MCHC 33.2 01/30/2023 1611   RDW 13.6 01/30/2023 1611   RDW 16.1 (H) 10/04/2014 0359   LYMPHSABS 1.9 01/30/2023 1611   LYMPHSABS 1.1 10/04/2014 0359   MONOABS 0.4 01/30/2023 1611   MONOABS 0.6 10/04/2014 0359   EOSABS 0.4 01/30/2023 1611   EOSABS 0.1 10/04/2014 0359   BASOSABS 0.1 01/30/2023 1611   BASOSABS 0.0 10/04/2014 0359   BASOSABS 0 10/02/2014 1243    Hepatic Function      Component Value Date/Time   PROT 7.2 08/21/2022 1749   ALBUMIN 3.3 (L) 08/21/2022 1749   AST 22 08/21/2022 1749   ALT 12 08/21/2022 1749   ALKPHOS 95 08/21/2022 1749   BILITOT 0.4 08/21/2022 1749   BILIDIR 0.1 09/22/2011 1433    Renal Function   Lab Results  Component Value Date   CREATININE 1.30 (H) 01/30/2023   CREATININE 1.50 (H) 08/21/2022   CREATININE 1.58 (H) 08/16/2022    CrCl cannot be calculated (Unknown ideal weight.).   [facility] Vascular Lab Studies:  01/30/23 VAS [place] UPPER EXTREMITY VENOUS DUPLEX LEFT  Summary:  Right:  No evidence of thrombosis in the  subclavian.    Left:  Findings consistent with acute deep vein thrombosis involving the left  subclavian vein, left axillary vein and left brachial veins. Findings  consistent  with acute superficial vein thrombosis involving the left basilic vein.   ASSESSMENT: Location of DVT: Left upper extremity Cause of DVT: provoked by a transient risk factor - recent L shoulder injury  Given location of DVT, discussed patient with Dr. Chestine Spore. Concern for venous thoracic outlet syndrome is low at this time given the patient's age, symptom presentation, no recent repetitive motions over the head, and DVT more likely to be related to recent L shoulder injury. Swelling is back to baseline and varicose veins that were noticed at DVT diagnosis have improved. No pain at diagnosis of DVT or now. Will plan to treat with anticoagulation for 6 months.   PLAN: -Continue apixaban (Eliquis) 5 mg twice daily. -Expected duration of therapy: 6 months. Therapy started on 01/30/23. -Patient educated on purpose, proper use and potential adverse effects of apixaban (Eliquis). -Discussed importance of taking medication around the same time every day. -Advised patient of medications to avoid (NSAIDs, aspirin doses >100 mg daily). -Educated that Tylenol (acetaminophen) is the preferred analgesic to lower the risk of bleeding. -Advised patient to alert all providers of anticoagulation therapy prior to starting a new medication or having a procedure. -Emphasized importance  of monitoring for signs and symptoms of bleeding (abnormal bruising, prolonged bleeding, nose bleeds, bleeding from gums, discolored urine, black tarry stools). -Educated patient to present to the ED if emergent signs and symptoms of new thrombosis occur. -Sent in refills for Eliquis.  -Ensure cancer screenings are up to date.   Follow up: DVT Clinic in 1 month  Pervis Hocking, PharmD, [place], CPP Deep Vein Thrombosis Clinic Clinical Pharmacist  Practitioner Office: [phone]

## 2023-02-14 NOTE — Patient Instructions (Signed)
-  Continue apixaban (Eliquis) 5 mg twice daily. -Your refills have been sent to your [facility]. You may need to call the pharmacy to ask them to fill this when you start to run low on your current supply.  -It is important to take your medication around the same time every day.  -Avoid NSAIDs like ibuprofen (Advil, Motrin) and naproxen (Aleve) as well as aspirin doses over 100 mg daily. -Tylenol (acetaminophen) is the preferred over the counter pain medication to lower the risk of bleeding. -Be sure to alert all of your health care providers that you are taking an anticoagulant prior to starting a new medication or having a procedure. -Monitor for signs and symptoms of bleeding (abnormal bruising, prolonged bleeding, nose bleeds, bleeding from gums, discolored urine, black tarry stools). If you have fallen and hit your head OR if your bleeding is severe or not stopping, seek emergency care.  -Go to the emergency room if emergent signs and symptoms of new clot occur (new or worse swelling and pain in an arm or leg, shortness of breath, chest pain, fast or irregular heartbeats, lightheadedness, dizziness, fainting, coughing up blood) or if you experience a significant color change (pale or blue) in the extremity that has the DVT.   Your next visit is on Tuesday, June 25th at 1:30pm.  [facility] [address] [place], [place], [place] [address] Enter the hospital through Entrance C off [facility] and pull up to the [facility] entrance to the free valet parking.  Check in for your appointment at the [facility] V[facility] Center.   If you have any questions or need to reschedule an appointment, please call [phone] [facility].  If you are having an emergency, call 911 or present to the nearest emergency room.   What is a DVT?  -Deep vein thrombosis (DVT) is a condition in which a blood clot forms in a vein of the deep venous system which can occur in the lower leg,  thigh, pelvis, arm, or neck. This condition is serious and can be life-threatening if the clot travels to the arteries of the lungs and causing a blockage (pulmonary embolism, PE). A DVT can also damage veins in the leg, which can lead to long-term venous disease, leg pain, swelling, discoloration, and ulcers or sores (post-thrombotic syndrome).  -Treatment may include taking an anticoagulant medication to prevent more clots from forming and the current clot from growing, wearing compression stockings, and/or surgical procedures to remove or dissolve the clot.

## 2023-03-27 DIAGNOSIS — M79641 Pain in right hand: Secondary | ICD-10-CM | POA: Diagnosis not present

## 2023-04-17 ENCOUNTER — Ambulatory Visit (HOSPITAL_COMMUNITY): Payer: Medicare Other

## 2023-04-18 DIAGNOSIS — U071 COVID-19: Secondary | ICD-10-CM | POA: Diagnosis not present

## 2023-04-23 ENCOUNTER — Telehealth (HOSPITAL_COMMUNITY): Payer: Self-pay

## 2023-04-23 MED ORDER — APIXABAN 5 MG PO TABS: 5.0000 mg | ORAL_TABLET | Freq: Two times a day (BID) | ORAL | 2 refills | Status: DC

## 2023-04-23 NOTE — Addendum Note (Signed)
Addended by: Pervis Hocking B on: 04/23/2023 12:33 PM   Modules accepted: Orders

## 2023-04-23 NOTE — Telephone Encounter (Signed)
Pt returned call, reports feeling unwell but on medication.  Scheduled appt for 15th to allow for recovery.  Will follow up regarding medication fill question for Eliquis.

## 2023-04-23 NOTE — Telephone Encounter (Signed)
Attempted to call pt regarding missed appt with DVT clinic, no answer.  Left voicemail with name and number, will attempt again at later date.

## 2023-04-23 NOTE — Telephone Encounter (Signed)
Provided 3 months of refills of Eliquis which will complete his 6 months of planned anticoagulation and informed patient of this. Will discuss the plan further at his next visit.

## 2023-05-07 ENCOUNTER — Ambulatory Visit (HOSPITAL_COMMUNITY): Payer: Medicare Other

## 2023-05-15 ENCOUNTER — Ambulatory Visit (HOSPITAL_COMMUNITY): Payer: Medicare Other

## 2023-05-16 ENCOUNTER — Telehealth (HOSPITAL_COMMUNITY): Payer: Self-pay

## 2023-05-16 NOTE — Telephone Encounter (Signed)
Attempted to call pt to reschedule missed appt, no answer.  Left vm with name and callback number, will attempt again at later date.

## 2023-05-17 DIAGNOSIS — Z794 Long term (current) use of insulin: Secondary | ICD-10-CM | POA: Diagnosis not present

## 2023-05-17 DIAGNOSIS — M545 Low back pain, unspecified: Secondary | ICD-10-CM | POA: Diagnosis not present

## 2023-05-17 DIAGNOSIS — J449 Chronic obstructive pulmonary disease, unspecified: Secondary | ICD-10-CM | POA: Diagnosis not present

## 2023-05-17 DIAGNOSIS — M25562 Pain in left knee: Secondary | ICD-10-CM | POA: Diagnosis not present

## 2023-05-17 DIAGNOSIS — J45909 Unspecified asthma, uncomplicated: Secondary | ICD-10-CM | POA: Diagnosis not present

## 2023-05-17 DIAGNOSIS — I2510 Atherosclerotic heart disease of native coronary artery without angina pectoris: Secondary | ICD-10-CM | POA: Diagnosis not present

## 2023-05-17 DIAGNOSIS — E1122 Type 2 diabetes mellitus with diabetic chronic kidney disease: Secondary | ICD-10-CM | POA: Diagnosis not present

## 2023-05-18 ENCOUNTER — Other Ambulatory Visit: Payer: Self-pay | Admitting: Family Medicine

## 2023-05-18 DIAGNOSIS — I714 Abdominal aortic aneurysm, without rupture, unspecified: Secondary | ICD-10-CM

## 2023-05-22 ENCOUNTER — Inpatient Hospital Stay (HOSPITAL_COMMUNITY): Admission: RE | Admit: 2023-05-22 | Payer: Medicare Other | Source: Ambulatory Visit

## 2023-05-22 DIAGNOSIS — E119 Type 2 diabetes mellitus without complications: Secondary | ICD-10-CM | POA: Diagnosis not present

## 2023-05-22 DIAGNOSIS — H40033 Anatomical narrow angle, bilateral: Secondary | ICD-10-CM | POA: Diagnosis not present

## 2023-06-04 ENCOUNTER — Ambulatory Visit: Payer: Medicare Other

## 2023-06-11 ENCOUNTER — Encounter: Payer: Self-pay | Admitting: *Deleted

## 2023-06-12 ENCOUNTER — Encounter: Payer: Self-pay | Admitting: Neurology

## 2023-06-12 ENCOUNTER — Ambulatory Visit: Payer: Medicare Other | Admitting: Neurology

## 2023-06-12 VITALS — BP 119/71 | HR 67 | Ht 70.0 in | Wt 170.0 lb

## 2023-06-12 DIAGNOSIS — G251 Drug-induced tremor: Secondary | ICD-10-CM

## 2023-06-12 DIAGNOSIS — R251 Tremor, unspecified: Secondary | ICD-10-CM | POA: Diagnosis not present

## 2023-06-12 NOTE — Progress Notes (Signed)
Subjective:    Patient ID: Chase Gibson is a 67 y.o. male.  HPI    Huston Foley, MD, PhD [facility] [address] [address] [place], [place] [address]  Dear Dr. Azucena Cecil,   I saw your patient, Chase Gibson, upon your kind request, in my neurologic clinic today for initial consultation of his tremor.  The patient is unaccompanied today.  As you know, Chase Gibson is a 67 year old male with an underlying complex medical history of coronary artery disease with history of MI, s/p angioplasty, chronic kidney disease, smoking, thrombocytopenia, hyperlipidemia, low back pain, reflux disease, diabetes, depression, anxiety, asthma, smoking, osteoarthritis, vertigo, peripheral edema, who reports a several year history of hand tremors, he has also developed some tremor in the lower extremities and head and neck area.  He is not aware of any family history of tremors.  He is on high-dose primidone through the [place], currently on 250 mg 3 times daily.  He also tried propranolol through the [place] but had an asthma attack.  He is currently not on propranolol.  [facility] which he did not try.  He is followed by his psychiatrist who is prescribing Depakote, he currently takes 750 mg daily.  He currently also takes high-dose Seroquel, 300 mg at bedtime.   Of note, he is on Eliquis and is advised that there is a potential interaction between primidone and Eliquis.   He tries to hydrate well.  He drinks caffeine in the form of tea or soda, about 2/day.  He does not drink any alcohol, he quit smoking in 2022.   He previously had seen Dr. Arbutus Leas in 2018 and I reviewed the records.  He was found to have tremor secondary to medications including valproic acid, Seroquel, Singulair and his inhalers.  He was on Depakote for many years as well as Seroquel for over a decade.  He had tried Cogentin in the past without any relief of his tremor.  He was not felt  to be at candidate for beta-blocker because of his COPD and depression history.  He was noted to have blepharospasm which was felt to be tardive in nature.  Botox injections were discussed at the time.  For his tremor he was started on low-dose primidone with gradual increase.  He was advised to proceed with a brain MRI.  It does not look like he had a brain MRI done.    He had a head CT without contrast through [facility] emergency room with indication of fall on 08/21/2022 and I reviewed the results: Impression: No acute intracranial pathology.  He had a head CT without contrast through [facility] ED on 08/16/2022 with indication of transient ischemic attack.  Difficulty standing a few days ago.  I reviewed the results: IMPRESSION: 1. No acute intracranial hemorrhage. 2. Patchy hypodensities in the pons on the left. If there is clinical concern for acute or subacute infarct, MRI is recommended for further evaluation. 3. Atrophy with chronic microvascular ischemic changes.   His Past Medical History Is Significant For: Past Medical History:  Diagnosis Date   ALLERGIC RHINITIS 03/18/2009   Anemia, unspecified type    ANXIETY 06/24/2007   pt denies at PAT visit on 02/25/15   ASTHMA 06/24/2007   Bipolar depression (HCC)    BREAST PAIN, LEFT 07/21/2008   CAD S/P percutaneous coronary angioplasty 03/11/2015   SEVERE SINGLE VESSEL CAD OF THE PROX LAD - 95%--> 0% post PCI  Vision BMS 3.5 x 18 (3.75 mm); MILD-MODERATE (~50%) NONOBSTRUCTIVE LCX AND RCA STENOSES    CKD (chronic kidney disease), stage III (HCC)    Colon polyps 03/28/2012   COPD (chronic obstructive pulmonary disease) (HCC) 03/28/2012   DEPRESSION 06/24/2007   DIABETES MELLITUS, TYPE II 07/21/2008   Essential tremor    GERD 08/25/2008   pt denies at PAT visit on 02/25/15   GERD without esophagitis    History of non-ST elevation myocardial infarction (NSTEMI) 03/11/2015   Mild Troponin ~0.45, transient Anterior ST-T  changes; LAD stenosis treated with BMS   HYPERLIPIDEMIA 07/21/2008   INSOMNIA-SLEEP DISORDER-UNSPEC 03/28/2010   LOW BACK PAIN 09/03/2007   Low back pain, unspecified    Myocardial infarction (HCC)    NAUSEA 06/14/2010   OSTEOARTHRITIS, KNEES, BILATERAL 08/25/2008   PERIPHERAL EDEMA 07/21/2008   Senile purpura (HCC)    SYMPTOM, ABNORMAL INVOLUNTARY MOVEMENT NEC 06/21/2007   Thrombocytopenia (HCC)    Tobacco abuse    Vertigo 11/2014   pt has a bout every 3-4 months, no pattern   Wheezing 07/13/2009    His Past Surgical History Is Significant For: Past Surgical History:  Procedure Laterality Date   APPENDECTOMY     CARDIAC CATHETERIZATION N/A 03/10/2015   Procedure: Left Heart Cath and Coronary Angiography;  Surgeon: Tonny Bollman, MD;  Location: [facility] INVASIVE CV LAB;  Service: Cardiovascula: EVERE SINGLE VESSEL CAD OF THE PROX LAD - 95%--> BMS PCI. MILD-MODERATE (~50%) NONOBSTRUCTIVE LCX AND RCA STENOSES    CARDIAC CATHETERIZATION N/A 03/10/2015   Procedure: Coronary Stent Intervention;  Surgeon: Tonny Bollman, MD;  Location: [facility] INVASIVE CV LAB: PROX LAD - 95%--> 0% post PCI Vision BMS 3.5 x 18 (3.75 mm) - BMS used b/c upcoming knee Sgx.   CHOLECYSTECTOMY     inguinal herniorrhapy Bilateral    1979 & 1997   KNEE ARTHROSCOPY Left    left   KNEE ARTHROSCOPY Left 09/17/2017   Procedure: ARTHROSCOPY KNEE, PATELLA FEMORAL  CHONDROPLASTY, PARTIAL MEDIAL MENISECTOMY;  Surgeon: Donato Heinz, MD;  Location: [facility] ORS;  Service: Orthopedics;  Laterality: Left;   Removal of right total knee implants for periprosthetic infection  10/02/2014   Right knee arthrotomy, debridement, and reinsertion of antibiotic cement spacer  12/18/2014   right knee surgury     x 9   ROTATOR CUFF REPAIR Bilateral    bilateral   TONSILLECTOMY     TOTAL KNEE ARTHROPLASTY Right 05/17/2015   Procedure: TOTAL KNEE ARTHROPLASTY, REMOVAL OF SPACER;  Surgeon: Donato Heinz, MD;  Location: [facility] ORS;  Service:  Orthopedics;  Laterality: Right;   TRANSTHORACIC ECHOCARDIOGRAM  03/10/2015   EF 65 and 70%. No RWMA. ~ normal diastolic parameters    His Family History Is Significant For: Family History  Problem Relation Age of Onset   Emphysema Mother        never smoker but spouse smoked   Lung cancer Father    Rheum arthritis Sister    Asthma Maternal Grandmother    Allergies Son    Coronary artery disease Other    Parkinson's disease Neg Hx    Tremor Neg Hx     His Social History Is Significant For: Social History   Socioeconomic History   Marital status: Divorced    Spouse name: Not on file   Number of children: 2   Years of education: Not on file   Highest education level: Not on file  Occupational History   Not on file  Tobacco Use  Smoking status: Former    Current packs/day: 0.00    Average packs/day: 1 pack/day for 35.0 years (35.0 ttl pk-yrs)    Types: Cigarettes    Start date: 44    Quit date: 2018    Years since quitting: 6.6   Smokeless tobacco: Former  Building services engineer status: Some Days  Substance and Sexual Activity   Alcohol use: No   Drug use: No   Sexual activity: Not on file  Other Topics Concern   Not on file  Social History Narrative   Works as a Mudlogger. Was actually not working at the time of his MI because of pain from his knee. He was scheduled to have right knee total replacement the week after his MI.   Former long-term smoker, who quit following his MI.   He has a huge Holiday representative with tattoos   Social Determinants of Health   Financial Resource Strain: Low Risk  (06/30/2021)   Received from [facility], [facility]   Overall Financial Resource Strain (CARDIA)    Difficulty of Paying Living Expenses: Not very hard  Food Insecurity: No Food Insecurity (01/10/2022)   Received from [facility], [facility]   Hunger Vital Sign    Worried About Running Out of Food in the Last Year: Never true    Ran Out of Food in the  Last Year: Never true  Transportation Needs: No Transportation Needs (06/30/2021)   Received from [facility], [facility]   PRAPARE - Transportation    Lack of Transportation (Medical): No    Lack of Transportation (Non-Medical): No  Physical Activity: Sufficiently Active (06/30/2021)   Received from [facility], [facility]   Exercise Vital Sign    Days of Exercise per Week: 4 days    Minutes of Exercise per Session: 40 min  Stress: No Stress Concern Present (06/30/2021)   Received from [facility], [facility] - Occupational Stress Questionnaire    Feeling of Stress : Not at all  Social Connections: Unknown (02/20/2022)   Received from [facility], [facility]   Social Network    Social Network: Not on file    His Allergies Are:  Allergies  Allergen Reactions   Nsaids Nausea And Vomiting, Rash, Dermatitis, Hives and Shortness Of Breath   Propranolol Other (See Comments)    Asthma attacks   Ibuprofen Hives   Varenicline Other (See Comments)    Nightmares   Tramadol Itching  :   His Current Medications Are:  Outpatient Encounter Medications as of 06/12/2023  Medication Sig   albuterol (VENTOLIN HFA) 108 (90 BASE) MCG/ACT inhaler Inhale 1-2 puffs into the lungs every 4 (four) hours as needed for wheezing or shortness of breath.    apixaban (ELIQUIS) 5 MG TABS tablet Take 1 tablet (5 mg total) by mouth 2 (two) times daily.   atorvastatin (LIPITOR) 80 MG tablet Take 0.5-1 tablets (40-80 mg total) by mouth daily. SCHEDULE OFFICE VISIT FOR FUTURE REFILLS.   cyclobenzaprine (FLEXERIL) 10 MG tablet Take 10 mg by mouth 3 (three) times daily as needed for muscle spasms.   divalproex (DEPAKOTE) 250 MG DR tablet Take 750 mg by mouth daily. Take 3 tablets by mouth daily   HYDROcodone-acetaminophen (NORCO) 10-325 MG per tablet Take 1 tablet by mouth every 6 (six) hours as needed for moderate pain.   insulin glargine (LANTUS SOLOSTAR) 100  UNIT/ML Solostar Pen Inject 20 Units into  the skin daily.   ipratropium-albuterol (DUONEB) 0.5-2.5 (3) MG/3ML SOLN Take 3 mLs by nebulization every 6 (six) hours as needed.   losartan (COZAAR) 25 MG tablet Take 25 mg by mouth daily.    meclizine (ANTIVERT) 12.5 MG tablet Take 12.5 mg by mouth 3 (three) times daily as needed for dizziness.   metFORMIN (GLUCOPHAGE) 500 MG tablet Take 500 mg by mouth 2 (two) times daily with a meal.   montelukast (SINGULAIR) 10 MG tablet Take 10 mg daily by mouth.    nitroGLYCERIN (NITROSTAT) 0.4 MG SL tablet Place 1 tablet (0.4 mg total) under the tongue every 5 (five) minutes as needed for chest pain.   Oxycodone HCl 10 MG TABS Uses as needed for severe pain if hydrocodone not helping   primidone (MYSOLINE) 250 MG tablet Take by mouth 3 (three) times daily.   QUEtiapine (SEROQUEL) 100 MG tablet Take 400 mg by mouth at bedtime.   roflumilast (DALIRESP) 500 MCG TABS tablet Take 1 tablet (500 mcg total) by mouth daily.   [DISCONTINUED] primidone (MYSOLINE) 50 MG tablet Take 1 tablet (50 mg total) by mouth 2 (two) times daily.   Facility-Administered Encounter Medications as of 06/12/2023  Medication   0.9 %  sodium chloride infusion  :   Review of Systems:  Out of a complete 14 point review of systems, all are reviewed and negative with the exception of these symptoms as listed below:   Review of Systems  Neurological:        Pt here for tremors Pt states in both hands Pt states writing has gotten worse     Objective:  Neurological Exam  Physical Exam Physical Examination:   Vitals:   06/12/23 1445  BP: 119/71  Pulse: 67    General Examination: The patient is a very pleasant 67 y.o. male in no acute distress. He appears well-developed and well-nourished and well groomed.   HEENT: Normocephalic, atraumatic, pupils are equal, round and reactive to light, tracking well-preserved, corrective eyeglasses in place, intermittent excess blinking, right  lid more prominent.  Face is symmetric with normal facial animation. Speech is clear with no dysarthria noted. There is no hypophonia. There is no lip, neck/head, jaw or voice tremor. Neck is supple with full range of passive and active motion. There are no carotid bruits on auscultation. Oropharynx exam reveals: mild mouth dryness.  No abnormal involuntary tongue movements.  Chest: Clear to auscultation without wheezing, rhonchi or crackles noted.  Heart: S1+S2+0, regular and normal without murmurs, rubs or gallops noted.   Abdomen: Soft, non-tender and non-distended.  Extremities: There is no pitting edema in the distal lower extremities bilaterally.   Skin: Warm and dry without trophic changes noted.   Musculoskeletal: exam reveals surgical scar right knee.  Right wrist with a protective bandage.  Neurologically:  Mental status: The patient is awake, alert and oriented in all 4 spheres. His immediate and remote memory, attention, language skills and fund of knowledge are appropriate. There is no evidence of aphasia, agnosia, apraxia or anomia. Speech is clear with normal prosody and enunciation. Thought process is linear. Mood is normal and affect is normal.  Cranial nerves II - XII are as described above under HEENT exam.  Motor exam: Normal bulk, strength and tone is noted with the exception of decreased grip strength right hand due to injury to the right wrist. There is no obvious action or resting tremor.  He has a mild to moderate postural tremor in both upper extremities,  intermittent trembling with the lower extremities noted, moderate action tremor in both upper extremities, no significant intention tremor.  On Archimedes spiral drawing he has moderate trembling with the left and the right hand.  Handwriting is moderate to severely tremulous, not micrographic, difficult to read. Fine motor skills and coordination: No significant difficulty with finger taps or hand movements but does  have limited ability to make a fist on the right side.    Cerebellar testing: No dysmetria or intention tremor. There is no truncal or gait ataxia.  Sensory exam: intact to light touch in the upper and lower extremities.  Gait, station and balance: He stands without difficulty, he does have some initial issues with balance, stands a little wider base.  He walks without a walking aid but has a mildly insecure gait.  Assessment and Plan:   In summary, Chase Gibson is a very pleasant 67 y.o.-year old male with an underlying complex medical history of coronary artery disease with history of MI, s/p angioplasty, chronic kidney disease, prior smoking, thrombocytopenia, hyperlipidemia, low back pain, reflux disease, diabetes, depression, anxiety, asthma, smoking, osteoarthritis, vertigo, peripheral edema, who reports a several year history of hand tremors, who presents for evaluation of his tremor of several years duration.  Examination shows bilateral upper extremity tremors particularly with postural and action, no significant resting tremor.  He also has some lower jaw tremor.  He may have essential tremor, however, his situation is complicated by long-term use of medications that can also cause or exacerbate tremors including valproic acid and high-dose Seroquel.  He is currently on high-dose Mysoline which I do not recommend for him.  It can cause balance issues and sedation as well as interaction with Eliquis.  He is advised [facility] to [facility] off his primidone.  I would not recommend a beta-blocker for him due to his history of asthma.  In fact, he tried a beta-blocker in the past and had exacerbation of his asthma.  He is advised to stay well-hydrated and limit caffeine, follow-up with his [place] provider.  Unfortunately, there is not much I can offer at this time other than also considering tapering down on his valproic acid if possible and his Seroquel.  He is advised to consider  seeing a academic neurology practice for the possibility of being evaluated for focused ultrasound treatment for tremors versus deep brain stimulation.  He is encouraged to talk to you about this for potential referral.  I answered all his questions today and he was in agreement.   Thank you very much for allowing me to participate in the care of this nice patient. If I can be of any further assistance to you please do not hesitate to call me at [phone].  Sincerely,   Huston Foley, MD, PhD

## 2023-06-12 NOTE — Patient Instructions (Signed)
It was nice to meet you today.  You may have essential tremor, but keep in mind that medications such as Seroquel and Depakote can also exacerbate or cause tremors.  You are currently on high dose Mysoline/primidone, I do not typically recommend this higher dose.  In addition, there is a potential interaction between primidone and Eliquis.  I would recommend that you follow-[facility] with [facility] provider to [facility] off of primidone.  Unfortunately, you are not a good candidate for propranolol as it can exacerbate asthma.  You had an asthma exacerbation when you took propranolol in the past.  For now, unfortunately, there is no additional medication that I would recommend for tremor control.  You can talk to your psychiatrist about reducing the Depakote if possible and I would highly recommend that you [facility] with the [facility] to taper off the Mysoline.    Please remember, that any kind of tremor may be exacerbated by anxiety, anger, nervousness, excitement, dehydration, sleep deprivation, thyroid dysfunction, by caffeine, and low blood sugar values or blood sugar fluctuations. Some medications can exacerbate tremors, this includes certain asthma or COPD medications and certain antidepressants.   There are some surgical options and focused ultrasound treatment for tremor control, I would recommend you talk to your primary care about a referral to a academic neurology practice.

## 2023-07-11 DIAGNOSIS — J441 Chronic obstructive pulmonary disease with (acute) exacerbation: Secondary | ICD-10-CM | POA: Diagnosis not present

## 2023-08-07 DIAGNOSIS — D649 Anemia, unspecified: Secondary | ICD-10-CM | POA: Diagnosis not present

## 2023-08-07 DIAGNOSIS — E782 Mixed hyperlipidemia: Secondary | ICD-10-CM | POA: Diagnosis not present

## 2023-08-07 DIAGNOSIS — M25562 Pain in left knee: Secondary | ICD-10-CM | POA: Diagnosis not present

## 2023-08-07 DIAGNOSIS — N1831 Chronic kidney disease, stage 3a: Secondary | ICD-10-CM | POA: Diagnosis not present

## 2023-08-07 DIAGNOSIS — E1122 Type 2 diabetes mellitus with diabetic chronic kidney disease: Secondary | ICD-10-CM | POA: Diagnosis not present

## 2023-08-07 DIAGNOSIS — I2510 Atherosclerotic heart disease of native coronary artery without angina pectoris: Secondary | ICD-10-CM | POA: Diagnosis not present

## 2023-08-07 DIAGNOSIS — J441 Chronic obstructive pulmonary disease with (acute) exacerbation: Secondary | ICD-10-CM | POA: Diagnosis not present

## 2023-08-07 DIAGNOSIS — Z23 Encounter for immunization: Secondary | ICD-10-CM | POA: Diagnosis not present

## 2023-08-07 DIAGNOSIS — D696 Thrombocytopenia, unspecified: Secondary | ICD-10-CM | POA: Diagnosis not present

## 2023-08-07 DIAGNOSIS — J45909 Unspecified asthma, uncomplicated: Secondary | ICD-10-CM | POA: Diagnosis not present

## 2023-10-18 ENCOUNTER — Emergency Department (HOSPITAL_COMMUNITY)
Admission: EM | Admit: 2023-10-18 | Discharge: 2023-10-18 | Discharge status: Against Medical Advice | Payer: Medicare Other | Attending: Emergency Medicine | Admitting: Emergency Medicine

## 2023-10-18 DIAGNOSIS — M549 Dorsalgia, unspecified: Secondary | ICD-10-CM | POA: Diagnosis not present

## 2023-10-18 DIAGNOSIS — R109 Unspecified abdominal pain: Secondary | ICD-10-CM | POA: Diagnosis not present

## 2023-10-18 DIAGNOSIS — Z5321 Procedure and treatment not carried out due to patient leaving prior to being seen by health care provider: Secondary | ICD-10-CM | POA: Insufficient documentation

## 2023-10-18 NOTE — ED Notes (Signed)
Pt informed me that he has decided to leave the ED

## 2023-11-09 ENCOUNTER — Ambulatory Visit: Payer: Medicare Other | Attending: Cardiology | Admitting: Cardiology

## 2023-11-12 ENCOUNTER — Encounter: Payer: Self-pay | Admitting: Cardiology

## 2023-11-16 DIAGNOSIS — E1122 Type 2 diabetes mellitus with diabetic chronic kidney disease: Secondary | ICD-10-CM | POA: Diagnosis not present

## 2023-11-16 DIAGNOSIS — N1831 Chronic kidney disease, stage 3a: Secondary | ICD-10-CM | POA: Diagnosis not present

## 2023-11-16 DIAGNOSIS — M51361 Other intervertebral disc degeneration, lumbar region with lower extremity pain only: Secondary | ICD-10-CM | POA: Diagnosis not present

## 2023-11-16 DIAGNOSIS — J439 Emphysema, unspecified: Secondary | ICD-10-CM | POA: Diagnosis not present

## 2023-12-19 ENCOUNTER — Encounter (HOSPITAL_BASED_OUTPATIENT_CLINIC_OR_DEPARTMENT_OTHER): Payer: Self-pay

## 2023-12-19 ENCOUNTER — Emergency Department (HOSPITAL_BASED_OUTPATIENT_CLINIC_OR_DEPARTMENT_OTHER): Payer: Medicare Other | Admitting: Radiology

## 2023-12-19 ENCOUNTER — Other Ambulatory Visit: Payer: Self-pay

## 2023-12-19 DIAGNOSIS — M25531 Pain in right wrist: Secondary | ICD-10-CM | POA: Diagnosis not present

## 2023-12-19 DIAGNOSIS — Z794 Long term (current) use of insulin: Secondary | ICD-10-CM | POA: Diagnosis not present

## 2023-12-19 DIAGNOSIS — M25532 Pain in left wrist: Secondary | ICD-10-CM | POA: Diagnosis not present

## 2023-12-19 DIAGNOSIS — Z7901 Long term (current) use of anticoagulants: Secondary | ICD-10-CM | POA: Insufficient documentation

## 2023-12-19 DIAGNOSIS — W010XXA Fall on same level from slipping, tripping and stumbling without subsequent striking against object, initial encounter: Secondary | ICD-10-CM | POA: Insufficient documentation

## 2023-12-19 MED ORDER — OXYCODONE-ACETAMINOPHEN 5-325 MG PO TABS
1.0000 | ORAL_TABLET | ORAL | Status: DC | PRN
  Administered 2023-12-19: 1 via ORAL
  Filled 2023-12-19: qty 1

## 2023-12-19 NOTE — ED Triage Notes (Signed)
 Pt reports severe pain in right wrist starting suddenly this afternoon after slipping and falling. Pt caught himself on his right hand. Mild swelling noted in wrist. Minimal mobility. Pt reports numbness/tingling in fingers of right hand.

## 2023-12-20 ENCOUNTER — Emergency Department (HOSPITAL_BASED_OUTPATIENT_CLINIC_OR_DEPARTMENT_OTHER)
Admission: EM | Admit: 2023-12-20 | Discharge: 2023-12-20 | Disposition: A | Discharge status: Home / Self Care | Payer: Medicare Other | Attending: Emergency Medicine | Admitting: Emergency Medicine

## 2023-12-20 DIAGNOSIS — M25531 Pain in right wrist: Secondary | ICD-10-CM

## 2023-12-20 NOTE — ED Provider Notes (Signed)
 [place] EMERGENCY DEPARTMENT AT [facility] Provider Note   CSN: [id number] Arrival date & time: 12/19/23  2037     History  Chief Complaint  Patient presents with   Wrist Pain    Chase Gibson is a 68 y.o. male.  Patient fell on both hands. Only sprained his left wrist but right wrist is more swollen and painful so came here fo revaluation. Improved some after percocet. Hurts worse with supination and pronation. No other trauma.    Wrist Pain       Home Medications Prior to Admission medications   Medication Sig Start Date End Date Taking? Authorizing Provider  albuterol (VENTOLIN HFA) 108 (90 BASE) MCG/ACT inhaler Inhale 1-2 puffs into the lungs every 4 (four) hours as needed for wheezing or shortness of breath.     [provider]  apixaban (ELIQUIS) 5 MG TABS tablet Take 1 tablet (5 mg total) by mouth 2 (two) times daily. 04/23/23   Pervis Hocking B, RPH-CPP  atorvastatin (LIPITOR) 80 MG tablet Take 0.5-1 tablets (40-80 mg total) by mouth daily. SCHEDULE OFFICE VISIT FOR FUTURE REFILLS. 08/11/22   Marykay Lex, MD  cyclobenzaprine (FLEXERIL) 10 MG tablet Take 10 mg by mouth 3 (three) times daily as needed for muscle spasms. 12/10/22   [provider]  divalproex (DEPAKOTE) 250 MG DR tablet Take 750 mg by mouth daily. Take 3 tablets by mouth daily    [provider]  HYDROcodone-acetaminophen (NORCO) 10-325 MG per tablet Take 1 tablet by mouth every 6 (six) hours as needed for moderate pain. 03/28/12   Corwin Levins, MD  insulin glargine (LANTUS SOLOSTAR) 100 UNIT/ML Solostar Pen Inject 20 Units into the skin daily. 08/24/20   [provider]  ipratropium-albuterol (DUONEB) 0.5-2.5 (3) MG/3ML SOLN Take 3 mLs by nebulization every 6 (six) hours as needed.    [provider]  losartan (COZAAR) 25 MG tablet Take 25 mg by mouth daily.  04/23/17   [provider]  meclizine (ANTIVERT) 12.5 MG tablet Take 12.5 mg by  mouth 3 (three) times daily as needed for dizziness.    [provider]  metFORMIN (GLUCOPHAGE) 500 MG tablet Take 500 mg by mouth 2 (two) times daily with a meal.    [provider]  montelukast (SINGULAIR) 10 MG tablet Take 10 mg daily by mouth.  09/28/11   Corwin Levins, MD  nitroGLYCERIN (NITROSTAT) 0.4 MG SL tablet Place 1 tablet (0.4 mg total) under the tongue every 5 (five) minutes as needed for chest pain. 02/03/19   Marykay Lex, MD  Oxycodone HCl 10 MG TABS Uses as needed for severe pain if hydrocodone not helping 01/22/20   [provider]  primidone (MYSOLINE) 250 MG tablet Take by mouth 3 (three) times daily. 08/15/22   [provider]  QUEtiapine (SEROQUEL) 100 MG tablet Take 400 mg by mouth at bedtime. 11/19/15   [provider]  roflumilast (DALIRESP) 500 MCG TABS tablet Take 1 tablet (500 mcg total) by mouth daily. 04/18/19   Ivonne Andrew, NP      Allergies    Nsaids, Propranolol, Ibuprofen, Varenicline, and Tramadol    Review of Systems   Review of Systems  Physical Exam Updated Vital Signs BP (!) 156/98 (BP Location: Left Arm)   Pulse 81   Temp 98.4 F (36.9 C) (Oral)   Resp 18   Ht 5\' 10"  (1.778 m)   Wt 74.8 kg   SpO2 94%  BMI 23.68 kg/m  Physical Exam Vitals and nursing note reviewed.  Constitutional:      Appearance: He is well-developed.  HENT:     Head: Normocephalic and atraumatic.  Cardiovascular:     Rate and Rhythm: Normal rate.  Pulmonary:     Effort: Pulmonary effort is normal. No respiratory distress.  Abdominal:     General: There is no distension.  Musculoskeletal:        General: Normal range of motion.     Cervical back: Normal range of motion.     Comments: Tenderness over distal radial head with associated hematoma. No deformity. NVI   Neurological:     Mental Status: He is alert.     ED Results / Procedures / Treatments   Labs (all labs ordered are listed, but only abnormal results  are displayed) Labs Reviewed - No data to display  EKG None  Radiology DG Wrist Complete Left Result Date: 12/19/2023 CLINICAL DATA:  Pain, fall EXAM: LEFT WRIST - COMPLETE 3+ VIEW COMPARISON:  None Available. FINDINGS: There is no evidence of fracture or dislocation. There is no evidence of arthropathy or other focal bone abnormality. Soft tissues are unremarkable. IMPRESSION: No acute bony abnormality. Electronically Signed   By: Charlett Nose M.D.   On: 12/19/2023 22:04   DG Wrist Complete Right Result Date: 12/19/2023 CLINICAL DATA:  Right wrist pain.  Fall. EXAM: RIGHT WRIST - COMPLETE 3+ VIEW COMPARISON:  None available. FINDINGS: There is no evidence of fracture or dislocation. There is no evidence of arthropathy or other focal bone abnormality. Soft tissues are unremarkable. IMPRESSION: No acute bony abnormality. Electronically Signed   By: Charlett Nose M.D.   On: 12/19/2023 22:03    Procedures Procedures    Medications Ordered in ED Medications  oxyCODONE-acetaminophen (PERCOCET/ROXICET) 5-325 MG per tablet 1 tablet (1 tablet Oral Given 12/19/23 2114)    ED Course/ Medical Decision Making/ A&P                                 Medical Decision Making Amount and/or Complexity of Data Reviewed Radiology: ordered.  Risk Prescription drug management.   Splint. Xr's reviewed without fracture. Fu w/ pcp if not improving. Has pain meds at home.   Final Clinical Impression(s) / ED Diagnoses Final diagnoses:  Right wrist pain    Rx / DC Orders ED Discharge Orders     None         Reality Dejonge, Barbara Cower, MD 12/20/23 [phone]

## 2024-01-08 ENCOUNTER — Ambulatory Visit (HOSPITAL_BASED_OUTPATIENT_CLINIC_OR_DEPARTMENT_OTHER): Admitting: Student

## 2024-01-08 DIAGNOSIS — M25531 Pain in right wrist: Secondary | ICD-10-CM

## 2024-01-08 DIAGNOSIS — S63502A Unspecified sprain of left wrist, initial encounter: Secondary | ICD-10-CM | POA: Diagnosis not present
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
## 2024-01-09 DIAGNOSIS — Z87891 Personal history of nicotine dependence: Secondary | ICD-10-CM | POA: Diagnosis not present

## 2024-01-09 DIAGNOSIS — J449 Chronic obstructive pulmonary disease, unspecified: Secondary | ICD-10-CM | POA: Diagnosis not present

## 2024-01-09 DIAGNOSIS — J209 Acute bronchitis, unspecified: Secondary | ICD-10-CM | POA: Diagnosis not present

## 2024-01-09 DIAGNOSIS — E1165 Type 2 diabetes mellitus with hyperglycemia: Secondary | ICD-10-CM | POA: Diagnosis not present

## 2024-01-11 ENCOUNTER — Encounter (HOSPITAL_BASED_OUTPATIENT_CLINIC_OR_DEPARTMENT_OTHER): Payer: Self-pay

## 2024-01-11 ENCOUNTER — Telehealth (HOSPITAL_BASED_OUTPATIENT_CLINIC_OR_DEPARTMENT_OTHER): Payer: Self-pay | Admitting: Student

## 2024-01-11 ENCOUNTER — Encounter (HOSPITAL_BASED_OUTPATIENT_CLINIC_OR_DEPARTMENT_OTHER): Payer: Self-pay | Admitting: Orthopaedic Surgery

## 2024-01-11 NOTE — Telephone Encounter (Signed)
 Patient needs a letter saying letter he can go back to work with out prescriptions on Monday. Please upload to [employer]

## 2024-03-06 DIAGNOSIS — M25532 Pain in left wrist: Secondary | ICD-10-CM | POA: Diagnosis not present

## 2024-03-06 DIAGNOSIS — N1831 Chronic kidney disease, stage 3a: Secondary | ICD-10-CM | POA: Diagnosis not present

## 2024-03-06 DIAGNOSIS — J441 Chronic obstructive pulmonary disease with (acute) exacerbation: Secondary | ICD-10-CM | POA: Diagnosis not present

## 2024-03-06 DIAGNOSIS — E1122 Type 2 diabetes mellitus with diabetic chronic kidney disease: Secondary | ICD-10-CM | POA: Diagnosis not present

## 2024-04-03 ENCOUNTER — Encounter (HOSPITAL_COMMUNITY): Payer: Self-pay | Admitting: Emergency Medicine

## 2024-04-03 ENCOUNTER — Emergency Department (HOSPITAL_COMMUNITY)

## 2024-04-03 ENCOUNTER — Emergency Department (HOSPITAL_COMMUNITY)
Admission: EM | Admit: 2024-04-03 | Discharge: 2024-04-04 | Disposition: A | Discharge status: Home / Self Care | Attending: Emergency Medicine | Admitting: Emergency Medicine

## 2024-04-03 DIAGNOSIS — Z794 Long term (current) use of insulin: Secondary | ICD-10-CM | POA: Diagnosis not present

## 2024-04-03 DIAGNOSIS — I2510 Atherosclerotic heart disease of native coronary artery without angina pectoris: Secondary | ICD-10-CM | POA: Diagnosis not present

## 2024-04-03 DIAGNOSIS — E875 Hyperkalemia: Secondary | ICD-10-CM | POA: Diagnosis not present

## 2024-04-03 DIAGNOSIS — Z7984 Long term (current) use of oral hypoglycemic drugs: Secondary | ICD-10-CM | POA: Insufficient documentation

## 2024-04-03 DIAGNOSIS — J9811 Atelectasis: Secondary | ICD-10-CM | POA: Diagnosis not present

## 2024-04-03 DIAGNOSIS — I7090 Unspecified atherosclerosis: Secondary | ICD-10-CM | POA: Diagnosis not present

## 2024-04-03 DIAGNOSIS — E119 Type 2 diabetes mellitus without complications: Secondary | ICD-10-CM | POA: Diagnosis not present

## 2024-04-03 DIAGNOSIS — J209 Acute bronchitis, unspecified: Secondary | ICD-10-CM | POA: Diagnosis not present

## 2024-04-03 DIAGNOSIS — I10 Essential (primary) hypertension: Secondary | ICD-10-CM | POA: Diagnosis not present

## 2024-04-03 DIAGNOSIS — I700 Atherosclerosis of aorta: Secondary | ICD-10-CM | POA: Diagnosis not present

## 2024-04-03 DIAGNOSIS — J449 Chronic obstructive pulmonary disease, unspecified: Secondary | ICD-10-CM | POA: Diagnosis not present

## 2024-04-03 DIAGNOSIS — R0602 Shortness of breath: Secondary | ICD-10-CM | POA: Diagnosis not present

## 2024-04-03 LAB — COMPREHENSIVE METABOLIC PANEL WITH GFR
ALT: 14 U/L (ref 0–44)
AST: 22 U/L (ref 15–41)
Albumin: 3.6 g/dL (ref 3.5–5.0)
Alkaline Phosphatase: 132 U/L — ABNORMAL HIGH (ref 38–126)
Anion gap: 9 (ref 5–15)
BUN: 20 mg/dL (ref 8–23)
CO2: 23 mmol/L (ref 22–32)
Calcium: 8.2 mg/dL — ABNORMAL LOW (ref 8.9–10.3)
Chloride: 105 mmol/L (ref 98–111)
Creatinine, Ser: 1.59 mg/dL — ABNORMAL HIGH (ref 0.61–1.24)
GFR, Estimated: 48 mL/min — ABNORMAL LOW (ref >60)
Glucose, Bld: 185 mg/dL — ABNORMAL HIGH (ref 70–99)
Potassium: 5.6 mmol/L — ABNORMAL HIGH (ref 3.5–5.1)
Sodium: 137 mmol/L (ref 135–145)
Total Bilirubin: 0.3 mg/dL (ref 0.0–1.2)
Total Protein: 7.3 g/dL (ref 6.5–8.1)

## 2024-04-03 LAB — CBC WITH DIFFERENTIAL/PLATELET
Abs Immature Granulocytes: 0.01 10*3/uL (ref 0.00–0.07)
Basophils Absolute: 0 10*3/uL (ref 0.0–0.1)
Basophils Relative: 1 %
Eosinophils Absolute: 0.6 10*3/uL — ABNORMAL HIGH (ref 0.0–0.5)
Eosinophils Relative: 11 %
HCT: 39.5 % (ref 39.0–52.0)
Hemoglobin: 12.7 g/dL — ABNORMAL LOW (ref 13.0–17.0)
Immature Granulocytes: 0 %
Lymphocytes Relative: 41 %
Lymphs Abs: 2.5 10*3/uL (ref 0.7–4.0)
MCH: 31.4 pg (ref 26.0–34.0)
MCHC: 32.2 g/dL (ref 30.0–36.0)
MCV: 97.5 fL (ref 80.0–100.0)
Monocytes Absolute: 0.4 10*3/uL (ref 0.1–1.0)
Monocytes Relative: 7 %
Neutro Abs: 2.4 10*3/uL (ref 1.7–7.7)
Neutrophils Relative %: 40 %
Platelets: 182 10*3/uL (ref 150–400)
RBC: 4.05 MIL/uL — ABNORMAL LOW (ref 4.22–5.81)
RDW: 15 % (ref 11.5–15.5)
WBC: 6 10*3/uL (ref 4.0–10.5)
nRBC: 0 % (ref 0.0–0.2)

## 2024-04-03 LAB — TROPONIN I (HIGH SENSITIVITY): Troponin I (High Sensitivity): 3 ng/L (ref <18)

## 2024-04-03 LAB — BRAIN NATRIURETIC PEPTIDE: B Natriuretic Peptide: 28.5 pg/mL (ref 0.0–100.0)

## 2024-04-03 MED ORDER — IPRATROPIUM-ALBUTEROL 0.5-2.5 (3) MG/3ML IN SOLN
3.0000 mL | Freq: Once | RESPIRATORY_TRACT | Status: AC
  Administered 2024-04-03: 3 mL via RESPIRATORY_TRACT
  Filled 2024-04-03: qty 3

## 2024-04-03 MED ORDER — PREDNISONE 20 MG PO TABS
40.0000 mg | ORAL_TABLET | Freq: Once | ORAL | Status: AC
  Administered 2024-04-03: 40 mg via ORAL
  Filled 2024-04-03: qty 2

## 2024-04-03 MED ORDER — SODIUM CHLORIDE 0.9 % IV BOLUS
500.0000 mL | Freq: Once | INTRAVENOUS | Status: AC
Start: 2024-04-03 — End: 2024-04-04
  Administered 2024-04-04: 500 mL via INTRAVENOUS
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
## 2024-04-03 NOTE — ED Triage Notes (Addendum)
 Pt states he cannot breath. Cough. Ambulance called last night and O2 was 91%. He got breathing treatment by EMS. He felt better so did not come in. He states he working and audibly wheezing. Hx of COPD and asthma. Started smoking a little again after quitting for 3 years.

## 2024-04-03 NOTE — ED Notes (Signed)
 PT ambulated from triage to room and sats 92%.

## 2024-04-04 ENCOUNTER — Emergency Department (HOSPITAL_COMMUNITY)

## 2024-04-04 ENCOUNTER — Encounter (HOSPITAL_COMMUNITY): Payer: Self-pay

## 2024-04-04 DIAGNOSIS — J9811 Atelectasis: Secondary | ICD-10-CM | POA: Diagnosis not present

## 2024-04-04 DIAGNOSIS — I2510 Atherosclerotic heart disease of native coronary artery without angina pectoris: Secondary | ICD-10-CM | POA: Diagnosis not present

## 2024-04-04 DIAGNOSIS — R0602 Shortness of breath: Secondary | ICD-10-CM | POA: Diagnosis not present

## 2024-04-04 DIAGNOSIS — J449 Chronic obstructive pulmonary disease, unspecified: Secondary | ICD-10-CM | POA: Diagnosis not present

## 2024-04-04 LAB — TROPONIN I (HIGH SENSITIVITY): Troponin I (High Sensitivity): 4 ng/L (ref <18)

## 2024-04-04 MED ORDER — IPRATROPIUM-ALBUTEROL 0.5-2.5 (3) MG/3ML IN SOLN
3.0000 mL | Freq: Once | RESPIRATORY_TRACT | Status: AC
  Administered 2024-04-04: 3 mL via RESPIRATORY_TRACT
  Filled 2024-04-04: qty 3

## 2024-04-04 MED ORDER — BENZONATATE 100 MG PO CAPS
100.0000 mg | ORAL_CAPSULE | Freq: Once | ORAL | Status: AC
  Administered 2024-04-04: 100 mg via ORAL
  Filled 2024-04-04: qty 1

## 2024-04-04 MED ORDER — SODIUM ZIRCONIUM CYCLOSILICATE 10 G PO PACK
10.0000 g | PACK | Freq: Once | ORAL | Status: AC
  Administered 2024-04-04: 10 g via ORAL
  Filled 2024-04-04: qty 1

## 2024-04-04 MED ORDER — PREDNISONE 10 MG PO TABS: 40.0000 mg | ORAL_TABLET | Freq: Every day | ORAL | 0 refills | Status: DC

## 2024-04-04 MED ORDER — AZITHROMYCIN 250 MG PO TABS
250.0000 mg | ORAL_TABLET | Freq: Every day | ORAL | 0 refills | Status: AC
Start: 2024-04-04 — End: ?

## 2024-04-04 MED ORDER — IOHEXOL 350 MG/ML SOLN: 75.0000 mL | Freq: Once | INTRAVENOUS | Status: DC | PRN

## 2024-04-04 MED ORDER — ALBUTEROL SULFATE HFA 108 (90 BASE) MCG/ACT IN AERS
1.0000 | INHALATION_SPRAY | Freq: Once | RESPIRATORY_TRACT | Status: AC
  Administered 2024-04-04: 1 via RESPIRATORY_TRACT
  Filled 2024-04-04: qty 6.7

## 2024-04-04 MED ORDER — IOHEXOL 350 MG/ML SOLN
75.0000 mL | Freq: Once | INTRAVENOUS | Status: AC | PRN
  Administered 2024-04-04: 75 mL via INTRAVENOUS

## 2024-04-04 NOTE — Discharge Instructions (Signed)
 Your potassium was high today.  Is very important for you to follow-up with your family doctor to have this rechecked in the next several days.  Get rechecked if your breathing worsens or if new concerning symptoms.

## 2024-05-01 DIAGNOSIS — E1122 Type 2 diabetes mellitus with diabetic chronic kidney disease: Secondary | ICD-10-CM | POA: Diagnosis not present

## 2024-05-01 DIAGNOSIS — M47816 Spondylosis without myelopathy or radiculopathy, lumbar region: Secondary | ICD-10-CM | POA: Diagnosis not present

## 2024-05-01 DIAGNOSIS — Z96651 Presence of right artificial knee joint: Secondary | ICD-10-CM | POA: Diagnosis not present

## 2024-05-01 DIAGNOSIS — K219 Gastro-esophageal reflux disease without esophagitis: Secondary | ICD-10-CM | POA: Diagnosis not present

## 2024-05-01 DIAGNOSIS — E785 Hyperlipidemia, unspecified: Secondary | ICD-10-CM | POA: Diagnosis not present

## 2024-05-01 DIAGNOSIS — I2510 Atherosclerotic heart disease of native coronary artery without angina pectoris: Secondary | ICD-10-CM | POA: Diagnosis not present

## 2024-05-01 DIAGNOSIS — Z7984 Long term (current) use of oral hypoglycemic drugs: Secondary | ICD-10-CM | POA: Diagnosis not present

## 2024-05-01 DIAGNOSIS — Z79899 Other long term (current) drug therapy: Secondary | ICD-10-CM | POA: Diagnosis not present

## 2024-05-01 DIAGNOSIS — N183 Chronic kidney disease, stage 3 unspecified: Secondary | ICD-10-CM | POA: Diagnosis not present

## 2024-05-01 DIAGNOSIS — Z7982 Long term (current) use of aspirin: Secondary | ICD-10-CM | POA: Diagnosis not present

## 2024-05-01 DIAGNOSIS — I129 Hypertensive chronic kidney disease with stage 1 through stage 4 chronic kidney disease, or unspecified chronic kidney disease: Secondary | ICD-10-CM | POA: Diagnosis not present

## 2024-05-08 DIAGNOSIS — J441 Chronic obstructive pulmonary disease with (acute) exacerbation: Secondary | ICD-10-CM | POA: Diagnosis not present

## 2024-06-10 ENCOUNTER — Other Ambulatory Visit: Payer: Self-pay | Admitting: Orthopedic Surgery

## 2024-06-23 NOTE — Progress Notes (Deleted)
 Chase Gibson, male    DOB: June 10, 1956   MRN: [id number]   Brief patient profile:  73 yowm  *** referred to pulmonary clinic 06/24/2024 by *** for ***      Last seen by me  03/2013 s/p smoking cessation  Feb 2014 with hx of asthmatic bronchitis x around 1994    Seen 2020 by Dr Shelah clinical dx copd/ no pfts in EPIC   History of Present Illness  06/24/2024  Pulmonary/ 1st office eval/Macario Shear  No chief complaint on file.    Dyspnea:  *** Cough: *** Sleep: *** SABA use: *** 02 use:*** LDSCT:***  No obvious day to day or daytime pattern/variability or assoc excess/ purulent sputum or mucus plugs or hemoptysis or cp or chest tightness, subjective wheeze or overt sinus or hb symptoms.    Also denies any obvious fluctuation of symptoms with weather or environmental changes or other aggravating or alleviating factors except as outlined above   No unusual exposure hx or h/o childhood pna/ asthma or knowledge of premature birth.  Current Allergies, Complete Past Medical History, Past Surgical History, Family History, and Social History were reviewed in [employer] record.  ROS  The following are not active complaints unless bolded Hoarseness, sore throat, dysphagia, dental problems, itching, sneezing,  nasal congestion or discharge of excess mucus or purulent secretions, ear ache,   fever, chills, sweats, unintended wt loss or wt gain, classically pleuritic or exertional cp,  orthopnea pnd or arm/hand swelling  or leg swelling, presyncope, palpitations, abdominal pain, anorexia, nausea, vomiting, diarrhea  or change in bowel habits or change in bladder habits, change in stools or change in urine, dysuria, hematuria,  rash, arthralgias, visual complaints, headache, numbness, weakness or ataxia or problems with walking or coordination,  change in mood or  memory.             Outpatient Medications Prior to Visit  Medication Sig Dispense Refill   albuterol  (VENTOLIN  HFA)  108 (90 BASE) MCG/ACT inhaler Inhale 1-2 puffs into the lungs every 4 (four) hours as needed for wheezing or shortness of breath.      apixaban  (ELIQUIS ) 5 MG TABS tablet Take 1 tablet (5 mg total) by mouth 2 (two) times daily. (Patient not taking: Reported on 04/04/2024) 60 tablet 2   atorvastatin  (LIPITOR ) 80 MG tablet Take 0.5-1 tablets (40-80 mg total) by mouth daily. SCHEDULE OFFICE VISIT FOR FUTURE REFILLS. (Patient taking differently: Take 40 mg by mouth daily. SCHEDULE OFFICE VISIT FOR FUTURE REFILLS.) 15 tablet 0   azithromycin  (ZITHROMAX ) 250 MG tablet Take 1 tablet (250 mg total) by mouth daily. Take first 2 tablets together, then 1 every day until finished. 6 tablet 0   divalproex (DEPAKOTE) 500 MG DR tablet Take 1,000 mg by mouth daily.     gabapentin (NEURONTIN) 300 MG capsule Take 300 mg by mouth 3 (three) times daily.     HYDROcodone -acetaminophen  (NORCO) 10-325 MG per tablet Take 1 tablet by mouth every 6 (six) hours as needed for moderate pain.     insulin  glargine (LANTUS  SOLOSTAR) 100 UNIT/ML Solostar Pen Inject 18 Units into the skin daily.     ipratropium-albuterol  (DUONEB) 0.5-2.5 (3) MG/3ML SOLN Take 3 mLs by nebulization every 6 (six) hours as needed.     losartan (COZAAR) 25 MG tablet Take 25 mg by mouth daily.   1   meclizine  (ANTIVERT ) 12.5 MG tablet Take 12.5 mg by mouth 3 (three) times daily as needed for dizziness.  metFORMIN  (GLUCOPHAGE ) 500 MG tablet Take 1,000 mg by mouth daily with breakfast.     montelukast  (SINGULAIR ) 10 MG tablet Take 10 mg daily by mouth.      nitroGLYCERIN  (NITROSTAT ) 0.4 MG SL tablet Place 1 tablet (0.4 mg total) under the tongue every 5 (five) minutes as needed for chest pain. 25 tablet 6   predniSONE  (DELTASONE ) 10 MG tablet Take 4 tablets (40 mg total) by mouth daily. 20 tablet 0   primidone  (MYSOLINE ) 250 MG tablet Take 250 mg by mouth 3 (three) times daily.     QUEtiapine  (SEROQUEL ) 400 MG tablet Take 400 mg by mouth at bedtime.      roflumilast  (DALIRESP ) 500 MCG TABS tablet Take 1 tablet (500 mcg total) by mouth daily. (Patient not taking: Reported on 04/04/2024) 90 tablet 3   sertraline (ZOLOFT) 50 MG tablet Take 50 mg by mouth.     Facility-Administered Medications Prior to Visit  Medication Dose Route Frequency Provider Last Rate Last Admin   0.9 %  sodium chloride  infusion  500 mL Intravenous Continuous Legrand Victory LITTIE DOUGLAS, MD        Past Medical History:  Diagnosis Date   ALLERGIC RHINITIS 03/18/2009   Anemia, unspecified type    ANXIETY 06/24/2007   pt denies at PAT visit on 02/25/15   ASTHMA 06/24/2007   Bipolar depression (HCC)    BREAST PAIN, LEFT 07/21/2008   CAD S/P percutaneous coronary angioplasty 03/11/2015   SEVERE SINGLE VESSEL CAD OF THE PROX LAD - 95%--> 0% post PCI Vision BMS 3.5 x 18 (3.75 mm); MILD-MODERATE (~50%) NONOBSTRUCTIVE LCX AND RCA STENOSES    CKD (chronic kidney disease), stage III (HCC)    Colon polyps 03/28/2012   COPD (chronic obstructive pulmonary disease) (HCC) 03/28/2012   DEPRESSION 06/24/2007   DIABETES MELLITUS, TYPE II 07/21/2008   Essential tremor    GERD 08/25/2008   pt denies at PAT visit on 02/25/15   GERD without esophagitis    History of non-ST elevation myocardial infarction (NSTEMI) 03/11/2015   Mild Troponin ~0.45, transient Anterior ST-T changes; LAD stenosis treated with BMS   HYPERLIPIDEMIA 07/21/2008   INSOMNIA-SLEEP DISORDER-UNSPEC 03/28/2010   LOW BACK PAIN 09/03/2007   Low back pain, unspecified    Myocardial infarction (HCC)    NAUSEA 06/14/2010   OSTEOARTHRITIS, KNEES, BILATERAL 08/25/2008   PERIPHERAL EDEMA 07/21/2008   Senile purpura (HCC)    SYMPTOM, ABNORMAL INVOLUNTARY MOVEMENT NEC 06/21/2007   Thrombocytopenia (HCC)    Tobacco abuse    Vertigo 11/2014   pt has a bout every 3-4 months, no pattern   Wheezing 07/13/2009      Objective:     There were no vitals taken for this visit.         Assessment

## 2024-06-24 ENCOUNTER — Ambulatory Visit: Admitting: Internal Medicine

## 2024-07-03 ENCOUNTER — Telehealth: Payer: Self-pay

## 2024-07-03 ENCOUNTER — Ambulatory Visit (INDEPENDENT_AMBULATORY_CARE_PROVIDER_SITE_OTHER): Admitting: Internal Medicine

## 2024-07-03 ENCOUNTER — Encounter: Payer: Self-pay | Admitting: Internal Medicine

## 2024-07-03 VITALS — BP 126/60 | HR 98 | Temp 98.1°F | Ht 70.0 in | Wt 163.0 lb

## 2024-07-03 DIAGNOSIS — F17200 Nicotine dependence, unspecified, uncomplicated: Secondary | ICD-10-CM

## 2024-07-03 DIAGNOSIS — J4489 Other specified chronic obstructive pulmonary disease: Secondary | ICD-10-CM

## 2024-07-03 DIAGNOSIS — F17210 Nicotine dependence, cigarettes, uncomplicated: Secondary | ICD-10-CM

## 2024-07-03 DIAGNOSIS — R0602 Shortness of breath: Secondary | ICD-10-CM

## 2024-07-03 MED ORDER — BREZTRI AEROSPHERE 160-9-4.8 MCG/ACT IN AERO: 2.0000 | INHALATION_SPRAY | Freq: Two times a day (BID) | RESPIRATORY_TRACT | 5 refills | Status: AC

## 2024-07-03 MED ORDER — ALBUTEROL SULFATE HFA 108 (90 BASE) MCG/ACT IN AERS: 2.0000 | INHALATION_SPRAY | Freq: Four times a day (QID) | RESPIRATORY_TRACT | 6 refills | Status: AC | PRN

## 2024-07-03 NOTE — Progress Notes (Addendum)
 Chase Gibson    [id number]    [birth year]  Primary Care Physician:Swayne, Alm, MD  Referring Physician: Cristopher Bottcher, NP [phone] [place],  [place] [address] Reason for Consultation: shortness of breath and copd Date of Consultation: 07/03/2024  Chief complaint:   Chief Complaint  Patient presents with   Consult   COPD     HPI: Discussed the use of AI scribe software for clinical note transcription with the patient, who gave verbal consent to proceed.  History of Present Illness Chase Gibson is a 68 year old male with asthma and chronic bronchitis who presents with worsening shortness of breath. He was referred by his primary care doctor for management of his asthma and chronic bronchitis.  He has a long-standing history of asthma since childhood and chronic bronchitis. He has been using his albuterol  rescue inhaler four to five times daily, exceeding the prescribed frequency. Shortness of breath occurs during activities such as grocery shopping with his grandson, necessitating inhaler use.  He has a significant smoking history, having started at age 48-16. Although he has quit multiple times, he currently smokes about five cigarettes a day, reduced from a pack a day four years ago. This reduction coincided with a period when he contracted COVID-19, initially mistaken for the flu.  He experiences frequent episodes of bronchitis, treated with antibiotics and prednisone  approximately four times a year. Two months ago, he had severe coughing, leading to an ER visit where imaging revealed a spot on his lung, later identified as bronchitis.  His medical history includes coronary artery disease with stent placement, type 2 diabetes with controlled A1c, and various surgeries including heart catheterization, gallbladder and appendix removal, knee surgery, and rotator cuff repair.  No current chest pain but recalls a recent episode attributed to  indigestion after eating strawberries. He has never been diagnosed with pneumonia.  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  Exposures: lives at home with grandson of whom he has custody.  Smoking history:  Social History   Occupational History   Not on file  Tobacco Use   Smoking status: Every Day    Current packs/day: 0.25    Average packs/day: 0.9 packs/day for 42.7 years (39.2 ttl pk-yrs)    Types: Cigarettes    Start date: 41   Smokeless tobacco: Former  Building services engineer status: Some Days  Substance and Sexual Activity   Alcohol use: No   Drug use: No   Sexual activity: Not on file    Relevant family history:  Family History  Problem Relation Age of Onset   Emphysema Mother        never smoker but spouse smoked   Lung cancer Father    Rheum arthritis Sister    Arthritis/Rheumatoid Sister    Asthma Maternal Grandmother    Allergies Son    Coronary artery disease Other    Parkinson's disease Neg Hx    Tremor Neg Hx     Past Medical History:  Diagnosis Date   ALLERGIC RHINITIS 03/18/2009   Anemia, unspecified type    ANXIETY 06/24/2007   pt denies at PAT visit on 02/25/15   ASTHMA 06/24/2007   Bipolar depression (HCC)  BREAST PAIN, LEFT 07/21/2008   CAD S/P percutaneous coronary angioplasty 03/11/2015   SEVERE SINGLE VESSEL CAD OF THE PROX LAD - 95%--> 0% post PCI Vision BMS 3.5 x 18 (3.75 mm); MILD-MODERATE (~50%) NONOBSTRUCTIVE LCX AND RCA STENOSES    CKD (chronic kidney disease), stage III (HCC)    Colon polyps 03/28/2012   COPD (chronic obstructive pulmonary disease) (HCC) 03/28/2012   DEPRESSION 06/24/2007   DIABETES MELLITUS, TYPE II 07/21/2008   Essential tremor    GERD 08/25/2008   pt denies at PAT visit on 02/25/15   GERD  without esophagitis    History of non-ST elevation myocardial infarction (NSTEMI) 03/11/2015   Mild Troponin ~0.45, transient Anterior ST-T changes; LAD stenosis treated with BMS   HYPERLIPIDEMIA 07/21/2008   INSOMNIA-SLEEP DISORDER-UNSPEC 03/28/2010   LOW BACK PAIN 09/03/2007   Low back pain, unspecified    Myocardial infarction (HCC)    NAUSEA 06/14/2010   OSTEOARTHRITIS, KNEES, BILATERAL 08/25/2008   PERIPHERAL EDEMA 07/21/2008   Senile purpura (HCC)    SYMPTOM, ABNORMAL INVOLUNTARY MOVEMENT NEC 06/21/2007   Thrombocytopenia (HCC)    Tobacco abuse    Vertigo 11/2014   pt has a bout every 3-4 months, no pattern   Wheezing 07/13/2009    Past Surgical History:  Procedure Laterality Date   APPENDECTOMY     CARDIAC CATHETERIZATION N/A 03/10/2015   Procedure: Left Heart Cath and Coronary Angiography;  Surgeon: Ozell Fell, MD;  Location: [facility] INVASIVE CV LAB;  Service: Cardiovascula: EVERE SINGLE VESSEL CAD OF THE PROX LAD - 95%--> BMS PCI. MILD-MODERATE (~50%) NONOBSTRUCTIVE LCX AND RCA STENOSES    CARDIAC CATHETERIZATION N/A 03/10/2015   Procedure: Coronary Stent Intervention;  Surgeon: Ozell Fell, MD;  Location: [facility] INVASIVE CV LAB: PROX LAD - 95%--> 0% post PCI Vision BMS 3.5 x 18 (3.75 mm) - BMS used b/c upcoming knee Sgx.   CHOLECYSTECTOMY     inguinal herniorrhapy Bilateral    1979 & 1997   KNEE ARTHROSCOPY Left    left   KNEE ARTHROSCOPY Left 09/17/2017   Procedure: ARTHROSCOPY KNEE, PATELLA FEMORAL  CHONDROPLASTY, PARTIAL MEDIAL MENISECTOMY;  Surgeon: Mardee Lynwood SQUIBB, MD;  Location: [facility] ORS;  Service: Orthopedics;  Laterality: Left;   Removal of right total knee implants for periprosthetic infection  10/02/2014   Right knee arthrotomy, debridement, and reinsertion of antibiotic cement spacer  12/18/2014   right knee surgury     x 9   ROTATOR CUFF REPAIR Bilateral    bilateral   TONSILLECTOMY     TOTAL KNEE ARTHROPLASTY Right 05/17/2015   Procedure: TOTAL KNEE  ARTHROPLASTY, REMOVAL OF SPACER;  Surgeon: Lynwood SQUIBB Mardee, MD;  Location: [facility] ORS;  Service: Orthopedics;  Laterality: Right;   TRANSTHORACIC ECHOCARDIOGRAM  03/10/2015   EF 65 and 70%. No RWMA. ~ normal diastolic parameters     Physical Exam: Blood pressure 126/60, pulse 98, temperature 98.1 F (36.7 C), temperature source Oral, height 5' 10 (1.778 m), weight 163 lb (73.9 kg), SpO2 96%. Gen:      No acute distress, elderly ENT:  no nasal polyps, mucus membranes moist Lungs:    No increased respiratory effort, symmetric chest wall excursion, clear to auscultation bilaterally, diminished, rhonchi that clear with coughing, no wheezes or crackles CV:         Regular rate and rhythm; no murmurs, rubs, or gallops.  No pedal edema Abd:      + bowel sounds; soft, non-tender; no distension MSK: no acute synovitis of DIP or  PIP joints, no mechanics hands.  Skin:      Warm and dry; no rashes Neuro: normal speech, tardive dyskinesia Psych: alert and oriented x3, normal mood and affect   Data Reviewed/Medical Decision Making:  Independent interpretation of tests: Imaging:  Review of patient's ct chest images revealed bilateral lower lobe mucus plugging. The patient's images have been independently reviewed by me.    PFTs: I have personally reviewed the patient's PFTs and      No data to display          Labs:  Lab Results  Component Value Date   NA 137 04/03/2024   K 5.6 (H) 04/03/2024   CO2 23 04/03/2024   GLUCOSE 185 (H) 04/03/2024   BUN 20 04/03/2024   CREATININE 1.59 (H) 04/03/2024   CALCIUM  8.2 (L) 04/03/2024   GFR 75.50 03/25/2012   GFRNONAA 48 (L) 04/03/2024   Lab Results  Component Value Date   WBC 6.0 04/03/2024   HGB 12.7 (L) 04/03/2024   HCT 39.5 04/03/2024   MCV 97.5 04/03/2024   PLT 182 04/03/2024     Immunization status:  Immunization History  Administered Date(s) Administered   Influenza Split 07/18/2011   Influenza Whole 11/26/2007, 07/21/2008,  08/05/2009, 08/23/2010, 07/23/2012   Influenza,inj,Quad PF,6+ Mos 07/23/2018   Pneumococcal Polysaccharide-23 08/05/2009   Td 09/13/2009     I reviewed prior external note(s) from cardiology, ED  I reviewed the result(s) of the labs and imaging as noted above.   I have ordered pft   Assessment and Plan Assessment & Plan Chronic obstructive pulmonary disease with chronic bronchitis COPD with chronic bronchitis exacerbated by smoking. Frequent bronchitis episodes, uses albuterol  frequently. Reduced smoking significantly. Previous lung cancer screening negative. - Prescribed Breztri  inhaler, two puffs morning and night, gargle after use. - Continue albuterol  inhaler with refills. - Order pulmonary function testing. - Schedule follow-up in three months post-testing.  Smoking Cessation Counseling:  1. The patient is an everyday smoker and symptomatic due to the following condition COPD 2. The patient is currently contemplative in quitting smoking. 3. I advised patient to quit smoking. 4. We identified patient specific barriers to change.  5. I personally spent 3 minutes counseling the patient regarding tobacco use disorder. 6. We discussed management of stress and anxiety to help with smoking cessation, when applicable. 7. We discussed nicotine  replacement therapy, Wellbutrin, Chantix  as possible options. 8. I advised setting a quit date. 9. Follow?up arranged with our office to continue ongoing discussions. 10.Resources given to patient including quit hotline.   Return to Care: Return in about 3 months (around 10/02/2024) for APP.  Verdon Gore, MD Pulmonary and Critical Care Medicine [facility] Office:[phone]  CC: Cristopher Bottcher, NP

## 2024-07-03 NOTE — Patient Instructions (Addendum)
 It was a pleasure to see you today!  Please schedule follow up with APP in 3 months.  If my schedule is not open yet, we will contact you with a reminder closer to that time. Please call [phone] if you haven't heard from us  a month before, and always call us  sooner if issues or concerns arise. You can also send us  a message through MyChart, but but aware that this is not to be used for urgent issues and it may take up to 5-7 days to receive a reply. Please be aware that you will likely be able to view your results before I have a chance to respond to them. Please give us  5 business days to respond to any non-urgent results.    Before your next visit I would like you to have: Full set of PFTs   YOUR PLAN:  -CHRONIC OBSTRUCTIVE PULMONARY DISEASE WITH CHRONIC BRONCHITIS: Chronic obstructive pulmonary disease (COPD) with chronic bronchitis is a long-term condition that makes it hard to breathe due to inflamed airways and mucus buildup. Your COPD is worsened by smoking, and you have frequent bronchitis episodes. We have prescribed a new inhaler called Breztri , which you should use two puffs in the morning and at night, and remember to gargle after using it. You should continue using your albuterol  inhaler as needed. We will also conduct pulmonary function testing to better understand your lung function.  INSTRUCTIONS:  Please schedule a follow-up appointment in three months after completing the pulmonary function testing. Continue using your medications as prescribed and try to further reduce your smoking.

## 2024-07-03 NOTE — Telephone Encounter (Signed)
 Reason for CRM:        Pt has scheduled New Pt appt w/[facility] on 07/03/2024. Is requesting if his albuterol  (VENTOLIN  HFA) 108 (90 BASE) MCG/ACT inhaler refill could be sent to the [facility] on file. He was advised by his previous pulmonologist to reach out to clinic, that they were unable to refill the prescription. Of note, he is out of inhaler completely        CB#        [phone], requests call when prescription has been sent into pharmacy    Pt in office to see Dr. Meade. Routing as an FYI

## 2024-07-21 DIAGNOSIS — I2510 Atherosclerotic heart disease of native coronary artery without angina pectoris: Secondary | ICD-10-CM | POA: Diagnosis not present

## 2024-07-21 DIAGNOSIS — E782 Mixed hyperlipidemia: Secondary | ICD-10-CM | POA: Diagnosis not present

## 2024-07-21 DIAGNOSIS — J449 Chronic obstructive pulmonary disease, unspecified: Secondary | ICD-10-CM | POA: Diagnosis not present

## 2024-07-21 DIAGNOSIS — M25532 Pain in left wrist: Secondary | ICD-10-CM | POA: Diagnosis not present

## 2024-07-21 DIAGNOSIS — K219 Gastro-esophageal reflux disease without esophagitis: Secondary | ICD-10-CM | POA: Diagnosis not present

## 2024-07-21 DIAGNOSIS — Z0000 Encounter for general adult medical examination without abnormal findings: Secondary | ICD-10-CM | POA: Diagnosis not present

## 2024-07-21 DIAGNOSIS — E0822 Diabetes mellitus due to underlying condition with diabetic chronic kidney disease: Secondary | ICD-10-CM | POA: Diagnosis not present

## 2024-07-21 DIAGNOSIS — M25562 Pain in left knee: Secondary | ICD-10-CM | POA: Diagnosis not present

## 2024-07-21 DIAGNOSIS — N1831 Chronic kidney disease, stage 3a: Secondary | ICD-10-CM | POA: Diagnosis not present

## 2024-07-21 DIAGNOSIS — J45909 Unspecified asthma, uncomplicated: Secondary | ICD-10-CM | POA: Diagnosis not present

## 2024-07-21 DIAGNOSIS — M545 Low back pain, unspecified: Secondary | ICD-10-CM | POA: Diagnosis not present

## 2024-08-08 ENCOUNTER — Telehealth (HOSPITAL_BASED_OUTPATIENT_CLINIC_OR_DEPARTMENT_OTHER): Payer: Self-pay

## 2024-08-08 DIAGNOSIS — E782 Mixed hyperlipidemia: Secondary | ICD-10-CM | POA: Diagnosis not present

## 2024-08-08 DIAGNOSIS — E1122 Type 2 diabetes mellitus with diabetic chronic kidney disease: Secondary | ICD-10-CM | POA: Diagnosis not present

## 2024-08-08 DIAGNOSIS — Z23 Encounter for immunization: Secondary | ICD-10-CM | POA: Diagnosis not present

## 2024-08-08 DIAGNOSIS — M25532 Pain in left wrist: Secondary | ICD-10-CM | POA: Diagnosis not present

## 2024-08-08 DIAGNOSIS — M25562 Pain in left knee: Secondary | ICD-10-CM | POA: Diagnosis not present

## 2024-08-08 NOTE — Telephone Encounter (Signed)
 1st attempt to reach pt regarding surgical clearance and the need for an IN OFFICE appointment.  Left pt a detailed message to call back and get that scheduled.

## 2024-08-08 NOTE — Telephone Encounter (Signed)
   Pre-operative Risk Assessment    Patient Name: Chase Gibson  DOB: 08-Dec-1955 MRN: [id number]   Date of last office visit: 02/24/2020- Alm LELON Clay MD Date of next office visit: None   Request for Surgical Clearance    Procedure:  LT WRIST SCOPE/ W TFCCDEBRIDEMENT OPEN LT WRIST SL LIG-REPAIR  Date of Surgery:  Clearance 08/15/24                                Surgeon:  DR. Marsa Christen Surgeon's Group or Practice Name:  [facility] Phone number:  [phone] Fax number:  [phone]   Type of Clearance Requested:   - Medical  - Pharmacy:  Hold Apixaban  (Eliquis )     Type of Anesthesia:  AXILLARY BLOCK   Additional requests/questions:    Bonney Huxley Nussen Pullin   08/08/2024, 4:24 PM

## 2024-08-08 NOTE — Telephone Encounter (Signed)
   Name: Chase Gibson  DOB: [birth year]  MRN: [id number]  Primary Cardiologist: Alm Clay, MD  Chart reviewed as part of pre-operative protocol coverage. Because of [name]'s past medical history and time since last visit, he will require a follow-up in-office visit URGENTLY as procedure is scheduled for 08/15/2024 in order to better assess preoperative cardiovascular risk.  Pre-op covering staff: - Please schedule appointment and call patient to inform them. If patient already had an upcoming appointment within acceptable timeframe, please add pre-op clearance to the appointment notes so provider is aware. - Please contact requesting surgeon's office via preferred method (i.e, phone, fax) to inform them of need for appointment prior to surgery.  This message will also be routed to pharmacy pool and/or Dr Clay for input on holding Eliquis  as requested below so that this information is available to the clearing provider at time of patient's appointment. Last labs 04/03/2024.  Lamarr Satterfield, NP  08/08/2024, 4:35 PM

## 2024-08-11 ENCOUNTER — Encounter (HOSPITAL_BASED_OUTPATIENT_CLINIC_OR_DEPARTMENT_OTHER): Payer: Self-pay | Admitting: Orthopedic Surgery

## 2024-08-11 ENCOUNTER — Other Ambulatory Visit: Payer: Self-pay

## 2024-08-11 NOTE — Telephone Encounter (Signed)
 Will need to d/w preop APP if pt able to be scheduled on HF1st or if needs MD new pt appt. I will update the requesting office as well the pt needs new pt appt.

## 2024-08-11 NOTE — Telephone Encounter (Signed)
 Patient with diagnosis of DVT on Eliquis  for anticoagulation.  Patient has not been seen since 2021.  Procedure:  LT WRIST SCOPE/ W TFCCDEBRIDEMENT OPEN LT WRIST SL LIG-REPAIR  Date of procedure: 08/15/24   CrCl  46 ml.min Platelet count 182K  Patient has not had an Afib/aflutter ablation in the last 3 months, DCCV within the last 4 weeks or a watchman implanted in the last 45 days    Per office protocol, patient can hold Eliquis  for 2 days prior to procedure.    Patient will not need bridging with Lovenox  (enoxaparin ) around procedure.  **This guidance is not considered finalized until pre-operative APP has relayed final recommendations.**

## 2024-08-11 NOTE — Telephone Encounter (Signed)
 I left a message for  Dr. Delene surgery scheduler the pt is going to need a new pt appt to re-establish with cardiology.

## 2024-08-11 NOTE — Progress Notes (Signed)
 Received call from [facility] at Dr. Reece office that patient has been notified Cardiology will be reaching out to make appointment for pre-op cardiac clearance.  Spoke with patient who states he is awaiting a return call from Cardiology to make appointment.  Notified Rhonda and patient that clearance is needed by 12N on Thursday to ensure no delays for procedure. Both verbalized understanding.  Patient also states that he saw his PCP on Friday 10/17 and had blood work done at that time.  Pt states he will ask PCP to fax recent lab work to us . Pt understands he will need to come for BMET if that was not included in PCP lab work.

## 2024-08-11 NOTE — Telephone Encounter (Signed)
 Patient returned call.  I wasn't able to schedule him as he would be a new patient (last seen 2021), and I wasn't able to find a slot before Friday.  Maybe you can find something in the [facility].  Thank you

## 2024-08-11 NOTE — Telephone Encounter (Signed)
 I s/w the pt who needs to re-est with cardiology for preop clearance. Pt had seen Dr. Anner in the past. I stated I did not has anything with Dr. Anner in time for his surgery. Pt asked can he please see any cardiologist to re-est. States he appreciates any help that we can give him. He said he would like [place] office if possible as this is closer to home for him.   Py has been scheduled a new pt appt with Dr. Mady 08/14/24 @ 9 am. Pt is very grateful for all of the help we can provide  for him.  I will update all parties involved.

## 2024-08-14 ENCOUNTER — Ambulatory Visit: Attending: Internal Medicine | Admitting: Internal Medicine

## 2024-08-14 ENCOUNTER — Encounter: Payer: Self-pay | Admitting: Internal Medicine

## 2024-08-14 VITALS — BP 116/62 | HR 80 | Ht 70.0 in | Wt 159.0 lb

## 2024-08-14 DIAGNOSIS — I4510 Unspecified right bundle-branch block: Secondary | ICD-10-CM | POA: Diagnosis not present

## 2024-08-14 DIAGNOSIS — I2510 Atherosclerotic heart disease of native coronary artery without angina pectoris: Secondary | ICD-10-CM | POA: Diagnosis not present

## 2024-08-14 DIAGNOSIS — R011 Cardiac murmur, unspecified: Secondary | ICD-10-CM | POA: Diagnosis not present

## 2024-08-14 DIAGNOSIS — E785 Hyperlipidemia, unspecified: Secondary | ICD-10-CM

## 2024-08-14 DIAGNOSIS — E1169 Type 2 diabetes mellitus with other specified complication: Secondary | ICD-10-CM

## 2024-08-14 DIAGNOSIS — Z01818 Encounter for other preprocedural examination: Secondary | ICD-10-CM

## 2024-08-14 MED ORDER — ASPIRIN 81 MG PO TBEC: 81.0000 mg | DELAYED_RELEASE_TABLET | Freq: Every day | ORAL | Status: AC
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
## 2024-08-14 NOTE — Patient Instructions (Signed)
 Medication Instructions:  Your physician recommends the following medication changes.  START TAKING: Aspirin  81 mg daily (Once you are cleared by your surgeon)  *If you need a refill on your cardiac medications before your next appointment, please call your pharmacy*  Lab Work: No labs ordered today    Testing/Procedures: Your physician has requested that you have an echocardiogram. Echocardiography is a painless test that uses sound waves to create images of your heart. It provides your doctor with information about the size and shape of your heart and how well your heart's chambers and valves are working.   You may receive an ultrasound enhancing agent through an IV if needed to better visualize your heart during the echo. This procedure takes approximately one hour.  There are no restrictions for this procedure.  This will take place at [address] [facility] [address] [facility] [address], [place] [address]  Please note: We ask at that you not bring children with you during ultrasound (echo/ vascular) testing. Due to room size and safety concerns, children are not allowed in the ultrasound rooms during exams. Our front office staff cannot provide observation of children in our lobby area while testing is being conducted. An adult accompanying a patient to their appointment will only be allowed in the ultrasound room at the discretion of the ultrasound technician under special circumstances. We apologize for any inconvenience.   Follow-Up: At [facility], you and your health needs are our priority.  As part of our continuing mission to provide you with exceptional heart care, our providers are all part of one team.  This team includes your primary Cardiologist (physician) and Advanced Practice Providers or APPs (Physician Assistants and Nurse Practitioners) who all work together to provide you with the care you need, when you need it.  Your next appointment:   6  month(s)  Provider:   You may see Lonni Hanson, MD or one of the following Advanced Practice Providers on your designated Care Team:   Lonni Meager, NP Lesley Maffucci, PA-C Bernardino Bring, PA-C Cadence [place], PA-C Tylene Lunch, NP Barnie Hila, NP

## 2024-08-15 ENCOUNTER — Ambulatory Visit (HOSPITAL_BASED_OUTPATIENT_CLINIC_OR_DEPARTMENT_OTHER): Admitting: Certified Registered"

## 2024-08-15 ENCOUNTER — Other Ambulatory Visit: Payer: Self-pay

## 2024-08-15 ENCOUNTER — Encounter (HOSPITAL_BASED_OUTPATIENT_CLINIC_OR_DEPARTMENT_OTHER): Admission: RE | Disposition: A | Payer: Self-pay | Attending: Orthopedic Surgery

## 2024-08-15 ENCOUNTER — Ambulatory Visit (HOSPITAL_BASED_OUTPATIENT_CLINIC_OR_DEPARTMENT_OTHER)

## 2024-08-15 ENCOUNTER — Ambulatory Visit (HOSPITAL_BASED_OUTPATIENT_CLINIC_OR_DEPARTMENT_OTHER)
Admission: RE | Admit: 2024-08-15 | Discharge: 2024-08-15 | Disposition: A | Discharge status: Home / Self Care | Attending: Orthopedic Surgery | Admitting: Orthopedic Surgery

## 2024-08-15 ENCOUNTER — Encounter (HOSPITAL_BASED_OUTPATIENT_CLINIC_OR_DEPARTMENT_OTHER): Payer: Self-pay | Admitting: Orthopedic Surgery

## 2024-08-15 ENCOUNTER — Ambulatory Visit: Admitting: Internal Medicine

## 2024-08-15 DIAGNOSIS — X58XXXA Exposure to other specified factors, initial encounter: Secondary | ICD-10-CM | POA: Insufficient documentation

## 2024-08-15 DIAGNOSIS — I2510 Atherosclerotic heart disease of native coronary artery without angina pectoris: Secondary | ICD-10-CM | POA: Insufficient documentation

## 2024-08-15 DIAGNOSIS — N183 Chronic kidney disease, stage 3 unspecified: Secondary | ICD-10-CM | POA: Insufficient documentation

## 2024-08-15 DIAGNOSIS — M1990 Unspecified osteoarthritis, unspecified site: Secondary | ICD-10-CM | POA: Insufficient documentation

## 2024-08-15 DIAGNOSIS — F17210 Nicotine dependence, cigarettes, uncomplicated: Secondary | ICD-10-CM

## 2024-08-15 DIAGNOSIS — I252 Old myocardial infarction: Secondary | ICD-10-CM | POA: Diagnosis not present

## 2024-08-15 DIAGNOSIS — J4489 Other specified chronic obstructive pulmonary disease: Secondary | ICD-10-CM | POA: Insufficient documentation

## 2024-08-15 DIAGNOSIS — Z7984 Long term (current) use of oral hypoglycemic drugs: Secondary | ICD-10-CM | POA: Insufficient documentation

## 2024-08-15 DIAGNOSIS — G8929 Other chronic pain: Secondary | ICD-10-CM | POA: Insufficient documentation

## 2024-08-15 DIAGNOSIS — Z87891 Personal history of nicotine dependence: Secondary | ICD-10-CM | POA: Diagnosis not present

## 2024-08-15 DIAGNOSIS — E1122 Type 2 diabetes mellitus with diabetic chronic kidney disease: Secondary | ICD-10-CM | POA: Diagnosis not present

## 2024-08-15 DIAGNOSIS — I129 Hypertensive chronic kidney disease with stage 1 through stage 4 chronic kidney disease, or unspecified chronic kidney disease: Secondary | ICD-10-CM | POA: Insufficient documentation

## 2024-08-15 DIAGNOSIS — S63512A Sprain of carpal joint of left wrist, initial encounter: Secondary | ICD-10-CM | POA: Diagnosis present

## 2024-08-15 DIAGNOSIS — N189 Chronic kidney disease, unspecified: Secondary | ICD-10-CM | POA: Insufficient documentation

## 2024-08-15 DIAGNOSIS — S63592A Other specified sprain of left wrist, initial encounter: Secondary | ICD-10-CM | POA: Diagnosis present

## 2024-08-15 DIAGNOSIS — Z01818 Encounter for other preprocedural examination: Secondary | ICD-10-CM

## 2024-08-15 HISTORY — PX: ARTHROSCOPY, WRIST WITH DEBRIDEMENT: SHX7468

## 2024-08-15 LAB — GLUCOSE, CAPILLARY
Glucose-Capillary: 57 mg/dL — ABNORMAL LOW (ref 70–99)
Glucose-Capillary: 112 mg/dL — ABNORMAL HIGH (ref 70–99)
Glucose-Capillary: 84 mg/dL (ref 70–99)

## 2024-08-15 SURGERY — ARTHROSCOPY, WRIST WITH DEBRIDEMENT
Anesthesia: Regional | Site: Wrist | Laterality: Left

## 2024-08-15 MED ORDER — FENTANYL CITRATE (PF) 100 MCG/2ML IJ SOLN
INTRAMUSCULAR | Status: AC
  Filled 2024-08-15: qty 2

## 2024-08-15 MED ORDER — 0.9 % SODIUM CHLORIDE (POUR BTL) OPTIME
TOPICAL | Status: DC | PRN
  Administered 2024-08-15: 1000 mL

## 2024-08-15 MED ORDER — PROPOFOL 10 MG/ML IV BOLUS
INTRAVENOUS | Status: DC | PRN
  Administered 2024-08-15: 40 mg via INTRAVENOUS
  Administered 2024-08-15: 100 ug/kg/min via INTRAVENOUS
  Administered 2024-08-15: 60 mg via INTRAVENOUS

## 2024-08-15 MED ORDER — ROCURONIUM BROMIDE 10 MG/ML (PF) SYRINGE
PREFILLED_SYRINGE | INTRAVENOUS | Status: AC
  Filled 2024-08-15: qty 10

## 2024-08-15 MED ORDER — DEXTROSE 50 % IV SOLN
INTRAVENOUS | Status: AC
  Filled 2024-08-15: qty 50

## 2024-08-15 MED ORDER — OXYCODONE HCL 5 MG PO TABS
5.0000 mg | ORAL_TABLET | ORAL | 0 refills | Status: AC | PRN
Start: 2024-08-15 — End: ?

## 2024-08-15 MED ORDER — CEFAZOLIN SODIUM-DEXTROSE 2-4 GM/100ML-% IV SOLN
2.0000 g | INTRAVENOUS | Status: AC
Start: 2024-08-15 — End: 2024-08-15
  Administered 2024-08-15: 2 g via INTRAVENOUS

## 2024-08-15 MED ORDER — SODIUM CHLORIDE (PF) 0.9 % IJ SOLN
INTRAMUSCULAR | Status: DC | PRN
  Administered 2024-08-15: 3000 mL

## 2024-08-15 MED ORDER — MIDAZOLAM HCL (PF) 2 MG/2ML IJ SOLN
2.0000 mg | Freq: Once | INTRAMUSCULAR | Status: AC
  Administered 2024-08-15: 2 mg via INTRAVENOUS

## 2024-08-15 MED ORDER — MIDAZOLAM HCL 2 MG/2ML IJ SOLN
INTRAMUSCULAR | Status: AC
  Filled 2024-08-15: qty 2

## 2024-08-15 MED ORDER — DEXTROSE 50 % IV SOLN
25.0000 mL | Freq: Once | INTRAVENOUS | Status: AC
  Administered 2024-08-15: 25 mL via INTRAVENOUS

## 2024-08-15 MED ORDER — BUPIVACAINE-EPINEPHRINE (PF) 0.5% -1:200000 IJ SOLN
INTRAMUSCULAR | Status: DC | PRN
  Administered 2024-08-15: 30 mL via PERINEURAL

## 2024-08-15 MED ORDER — LACTATED RINGERS IV SOLN
INTRAVENOUS | Status: DC
Start: 2024-08-15 — End: 2024-08-15

## 2024-08-15 MED ORDER — ONDANSETRON HCL 4 MG/2ML IJ SOLN: 4.0000 mg | Freq: Once | INTRAMUSCULAR | Status: DC | PRN

## 2024-08-15 MED ORDER — ACETAMINOPHEN 500 MG PO TABS
ORAL_TABLET | ORAL | Status: AC
  Filled 2024-08-15: qty 2

## 2024-08-15 MED ORDER — CEFAZOLIN SODIUM-DEXTROSE 2-4 GM/100ML-% IV SOLN
INTRAVENOUS | Status: AC
  Filled 2024-08-15: qty 100

## 2024-08-15 MED ORDER — PHENYLEPHRINE HCL-NACL 20-0.9 MG/250ML-% IV SOLN
INTRAVENOUS | Status: DC | PRN
  Administered 2024-08-15: 40 ug/min via INTRAVENOUS

## 2024-08-15 MED ORDER — NALOXONE HCL 4 MG/0.1ML NA LIQD: 1.0000 | Freq: Once | NASAL | 0 refills | Status: AC

## 2024-08-15 MED ORDER — OXYCODONE HCL 5 MG PO TABS
ORAL_TABLET | ORAL | Status: AC
  Filled 2024-08-15: qty 1

## 2024-08-15 MED ORDER — SUGAMMADEX SODIUM 200 MG/2ML IV SOLN
INTRAVENOUS | Status: DC | PRN
  Administered 2024-08-15: 150 mg via INTRAVENOUS

## 2024-08-15 MED ORDER — ROCURONIUM BROMIDE 100 MG/10ML IV SOLN
INTRAVENOUS | Status: DC | PRN
  Administered 2024-08-15: 10 mg via INTRAVENOUS
  Administered 2024-08-15 (×2): 30 mg via INTRAVENOUS

## 2024-08-15 MED ORDER — FENTANYL CITRATE (PF) 100 MCG/2ML IJ SOLN
25.0000 ug | INTRAMUSCULAR | Status: DC | PRN
  Administered 2024-08-15 (×2): 25 ug via INTRAVENOUS

## 2024-08-15 MED ORDER — ACETAMINOPHEN 500 MG PO TABS
1000.0000 mg | ORAL_TABLET | Freq: Once | ORAL | Status: AC
  Administered 2024-08-15: 1000 mg via ORAL

## 2024-08-15 MED ORDER — PHENYLEPHRINE HCL (PRESSORS) 10 MG/ML IV SOLN
INTRAVENOUS | Status: DC | PRN
  Administered 2024-08-15 (×2): 80 ug via INTRAVENOUS

## 2024-08-15 MED ORDER — AMISULPRIDE (ANTIEMETIC) 5 MG/2ML IV SOLN: 10.0000 mg | Freq: Once | INTRAVENOUS | Status: DC | PRN

## 2024-08-15 MED ORDER — OXYCODONE HCL 5 MG PO TABS
5.0000 mg | ORAL_TABLET | Freq: Once | ORAL | Status: AC
Start: 2024-08-15 — End: 2024-08-15
  Administered 2024-08-15: 5 mg via ORAL

## 2024-08-15 SURGICAL SUPPLY — 87 items
BAG DECANTER FOR FLEXI CONT (MISCELLANEOUS) IMPLANT
BLADE ARTHRO LOK 4 BEAVER (BLADE) IMPLANT
BLADE EAR TYMPAN 2.5 60D BEAV (BLADE) IMPLANT
BLADE MINI RND TIP GREEN BEAV (BLADE) IMPLANT
BLADE SURG 15 STRL LF DISP TIS (BLADE) ×2 IMPLANT
BNDG COMPR ESMARK 4X3 LF (GAUZE/BANDAGES/DRESSINGS) ×1 IMPLANT
BNDG ELASTIC 2INX 5YD STR LF (GAUZE/BANDAGES/DRESSINGS) IMPLANT
BNDG ELASTIC 3INX 5YD STR LF (GAUZE/BANDAGES/DRESSINGS) ×1 IMPLANT
BNDG ELASTIC 4INX 5YD STR LF (GAUZE/BANDAGES/DRESSINGS) ×1 IMPLANT
BNDG GAUZE DERMACEA FLUFF 4 (GAUZE/BANDAGES/DRESSINGS) ×1 IMPLANT
CANISTER SUCT 1200ML W/VALVE (MISCELLANEOUS) IMPLANT
CATH ROBINSON RED A/P 10FR (CATHETERS) IMPLANT
CHLORAPREP W/TINT 26 (MISCELLANEOUS) ×1 IMPLANT
CORD BIPOLAR FORCEPS 12FT (ELECTRODE) ×1 IMPLANT
COTTONBALL LRG STERILE PKG (GAUZE/BANDAGES/DRESSINGS) IMPLANT
COVER BACK TABLE 60X90IN (DRAPES) ×1 IMPLANT
COVER MAYO STAND STRL (DRAPES) IMPLANT
CUFF TOURN SGL QUICK 18X4 (TOURNIQUET CUFF) ×1 IMPLANT
DRAPE EXTREMITY T 121X128X90 (DISPOSABLE) IMPLANT
DRAPE HAND 77X146 (DRAPES) ×1 IMPLANT
DRAPE IMP U-DRAPE 54X76 (DRAPES) ×1 IMPLANT
DRAPE OEC MINIVIEW 54X84 (DRAPES) IMPLANT
DRAPE SURG 17X23 STRL (DRAPES) ×1 IMPLANT
GAUZE 4X4 16PLY ~~LOC~~+RFID DBL (SPONGE) IMPLANT
GAUZE PAD ABD 8X10 STRL (GAUZE/BANDAGES/DRESSINGS) IMPLANT
GAUZE SPONGE 4X4 12PLY STRL (GAUZE/BANDAGES/DRESSINGS) ×1 IMPLANT
GAUZE STRETCH 2X75IN STRL (MISCELLANEOUS) IMPLANT
GAUZE XEROFORM 1X8 LF (GAUZE/BANDAGES/DRESSINGS) ×1 IMPLANT
GLOVE BIOGEL PI IND STRL 8.5 (GLOVE) IMPLANT
GLOVE SURG ORTHO 8.0 STRL STRW (GLOVE) IMPLANT
GLOVE SURG SYN 7.5 PF PI (GLOVE) ×2 IMPLANT
GOWN STRL REUS W/ TWL LRG LVL3 (GOWN DISPOSABLE) ×1 IMPLANT
GOWN STRL REUS W/TWL XL LVL3 (GOWN DISPOSABLE) ×2 IMPLANT
GOWN STRL SURGICAL XL XLNG (GOWN DISPOSABLE) IMPLANT
IV SET EXT 30 76VOL 4 MALE LL (IV SETS) ×1 IMPLANT
IV STOPCOCK 4 WAY 40 W/Y SET (IV SOLUTION) IMPLANT
LOOP VASCLR MAXI BLUE 18IN ST (MISCELLANEOUS) IMPLANT
NDL HYPO 22X1.5 SAFETY MO (MISCELLANEOUS) ×1 IMPLANT
NDL HYPO 25X1 1.5 SAFETY (NEEDLE) IMPLANT
NDL KEITH (NEEDLE) IMPLANT
NDL SAFETY ECLIPSE 18X1.5 (NEEDLE) ×1 IMPLANT
NDL SPNL 18GX3.5 QUINCKE PK (NEEDLE) IMPLANT
NEEDLE HYPO 22X1.5 SAFETY MO (MISCELLANEOUS) ×1 IMPLANT
NEEDLE HYPO 25X1 1.5 SAFETY (NEEDLE) IMPLANT
NEEDLE KEITH (NEEDLE) IMPLANT
NEEDLE SPNL 18GX3.5 QUINCKE PK (NEEDLE) IMPLANT
NS IRRIG 1000ML POUR BTL (IV SOLUTION) ×1 IMPLANT
PACK BASIN DAY SURGERY FS (CUSTOM PROCEDURE TRAY) ×1 IMPLANT
PAD CAST 3X4 CTTN HI CHSV (CAST SUPPLIES) ×1 IMPLANT
PAD CAST 4YDX4 CTTN HI CHSV (CAST SUPPLIES) IMPLANT
PADDING CAST ABS COTTON 3X4 (CAST SUPPLIES) ×1 IMPLANT
PADDING CAST ABS COTTON 4X4 ST (CAST SUPPLIES) ×1 IMPLANT
SET SM JOINT TUBING/CANN (CANNULA) IMPLANT
SHAVER DISSECTOR 3.0 (BURR) IMPLANT
SHAVER SABRE 2.0 (BURR) IMPLANT
SHEET MEDIUM DRAPE 40X70 STRL (DRAPES) ×1 IMPLANT
SLEEVE SCD COMPRESS KNEE MED (STOCKING) ×1 IMPLANT
SLING ARM FOAM STRAP LRG (SOFTGOODS) IMPLANT
SPIKE FLUID TRANSFER (MISCELLANEOUS) IMPLANT
SPLINT PLASTER CAST XFAST 3X15 (CAST SUPPLIES) IMPLANT
SPLINT PLASTER CAST XFAST 4X15 (CAST SUPPLIES) IMPLANT
STOCKINETTE 4X48 STRL (DRAPES) ×1 IMPLANT
STRAP SET WRIST TOWER ACUMED (MISCELLANEOUS) ×1 IMPLANT
SUT CHROMIC 5 0 P 3 (SUTURE) IMPLANT
SUT ETHIBOND 3-0 V-5 (SUTURE) IMPLANT
SUT ETHILON 3 0 PS 1 (SUTURE) IMPLANT
SUT ETHILON 4 0 PS 2 18 (SUTURE) ×1 IMPLANT
SUT ETHILON 5 0 PS 2 18 (SUTURE) IMPLANT
SUT MERSILENE 4 0 P 3 (SUTURE) IMPLANT
SUT MNCRL AB 4-0 PS2 18 (SUTURE) IMPLANT
SUT PDS AB 2-0 CT2 27 (SUTURE) IMPLANT
SUT PROLENE 2 0 SH DA (SUTURE) IMPLANT
SUT PROLENE 5 0 P 3 (SUTURE) IMPLANT
SUT SILK 4 0 PS 2 (SUTURE) IMPLANT
SUT SUPRAMID 4-0 (SUTURE) IMPLANT
SUT VIC AB 4-0 P-3 18XBRD (SUTURE) IMPLANT
SUT VIC AB 4-0 PS2 18 (SUTURE) IMPLANT
SUTURE FIBERWR 4-0 18 DIA BLUE (SUTURE) IMPLANT
SYR BULB EAR ULCER 3OZ GRN STR (SYRINGE) ×1 IMPLANT
SYR CONTROL 10ML LL (SYRINGE) ×1 IMPLANT
SYR TOOMEY 50ML (SYRINGE) IMPLANT
TOWEL GREEN STERILE FF (TOWEL DISPOSABLE) ×2 IMPLANT
TUBE CONNECTING 20X1/4 (TUBING) IMPLANT
TUBE NG 5FR 35IN ENFIT (TUBING) IMPLANT
TUBING ARTHROSCOPY IRRIG 16FT (MISCELLANEOUS) IMPLANT
UNDERPAD 30X36 HEAVY ABSORB (UNDERPADS AND DIAPERS) ×1 IMPLANT
WAND 1.5 MICROBLATOR (SURGICAL WAND) IMPLANT

## 2024-08-15 NOTE — Anesthesia Preprocedure Evaluation (Addendum)
 Anesthesia Evaluation  Patient identified by MRN, date of birth, ID band Patient awake    Reviewed: Allergy & Precautions, NPO status , Patient's Chart, lab work & pertinent test results  Airway Mallampati: II  TM Distance: >3 FB Neck ROM: Full    Dental no notable dental hx.    Pulmonary asthma , COPD,  COPD inhaler, Current Smoker and Patient abstained from smoking., former smoker   Pulmonary exam normal        Cardiovascular hypertension, Pt. on medications + CAD and + Past MI  Normal cardiovascular exam     Neuro/Psych  PSYCHIATRIC DISORDERS Anxiety Depression Bipolar Disorder   negative neurological ROS     GI/Hepatic negative GI ROS, Neg liver ROS,,,  Endo/Other  diabetes, Oral Hypoglycemic Agents    Renal/GU Renal disease     Musculoskeletal  (+) Arthritis ,    Abdominal   Peds  Hematology negative hematology ROS (+)   Anesthesia Other Findings LEFT SCAPHOLUNATE LIGAMENT INJURY, LEFT TRIANGULAR FIBROCARTILAGE COMPLEX TEAR  Reproductive/Obstetrics                              Anesthesia Physical Anesthesia Plan  ASA: 3  Anesthesia Plan: Regional   Post-op Pain Management:    Induction:   PONV Risk Score and Plan: 0 and Ondansetron , Dexamethasone , Propofol  infusion, Midazolam  and Treatment may vary due to age or medical condition  Airway Management Planned: Simple Face Mask  Additional Equipment:   Intra-op Plan:   Post-operative Plan:   Informed Consent: I have reviewed the patients History and Physical, chart, labs and discussed the procedure including the risks, benefits and alternatives for the proposed anesthesia with the patient or authorized representative who has indicated his/her understanding and acceptance.     Dental advisory given  Plan Discussed with: CRNA  Anesthesia Plan Comments:          Anesthesia Quick Evaluation

## 2024-08-15 NOTE — H&P (Signed)
 Orthopaedic Surgery Hand and Upper Extremity History and Physical Examination 08/15/2024   CC: Left wrist pain  HPI: Chase Gibson is a 68 y.o. male here today for left wrist surgery.   Past Medical History: Past Medical History:  Diagnosis Date   ALLERGIC RHINITIS 03/18/2009   Anemia, unspecified type    ANXIETY 06/24/2007   pt denies at PAT visit on 02/25/15   ASTHMA 06/24/2007   Bipolar depression (HCC)    BREAST PAIN, LEFT 07/21/2008   CAD S/P percutaneous coronary angioplasty 03/11/2015   SEVERE SINGLE VESSEL CAD OF THE PROX LAD - 95%--> 0% post PCI Vision BMS 3.5 x 18 (3.75 mm); MILD-MODERATE (~50%) NONOBSTRUCTIVE LCX AND RCA STENOSES    CKD (chronic kidney disease), stage III (HCC)    Colon polyps 03/28/2012   COPD (chronic obstructive pulmonary disease) (HCC) 03/28/2012   DEPRESSION 06/24/2007   DIABETES MELLITUS, TYPE II 07/21/2008   Essential tremor    GERD 08/25/2008   pt denies at PAT visit on 02/25/15   GERD without esophagitis    History of non-ST elevation myocardial infarction (NSTEMI) 03/11/2015   Mild Troponin ~0.45, transient Anterior ST-T changes; LAD stenosis treated with BMS   HYPERLIPIDEMIA 07/21/2008   INSOMNIA-SLEEP DISORDER-UNSPEC 03/28/2010   LOW BACK PAIN 09/03/2007   Low back pain, unspecified    Myocardial infarction (HCC)    NAUSEA 06/14/2010   OSTEOARTHRITIS, KNEES, BILATERAL 08/25/2008   PERIPHERAL EDEMA 07/21/2008   Senile purpura    SYMPTOM, ABNORMAL INVOLUNTARY MOVEMENT NEC 06/21/2007   Thrombocytopenia    Tobacco abuse    Vertigo 11/2014   pt has a bout every 3-4 months, no pattern   Wheezing 07/13/2009     Medications: Scheduled Meds: Continuous Infusions:   ceFAZolin  (ANCEF ) IV     lactated ringers 10 mL/hr at 08/15/24 0924   PRN Meds:.  Allergies: Allergies as of 06/10/2024 - Review Complete 04/04/2024  Allergen Reaction Noted   Nsaids Nausea And Vomiting, Rash, Dermatitis, Hives, and Shortness Of Breath 01/20/2014    Propranolol  Other (See Comments) 06/07/2022   Ibuprofen Hives 10/14/2015   Varenicline  Other (See Comments) 10/14/2015   Tramadol Itching 10/16/2014    Past Surgical History: Past Surgical History:  Procedure Laterality Date   APPENDECTOMY     CARDIAC CATHETERIZATION N/A 03/10/2015   Procedure: Left Heart Cath and Coronary Angiography;  Surgeon: Ozell Fell, MD;  Location: [facility] INVASIVE CV LAB;  Service: Cardiovascula: EVERE SINGLE VESSEL CAD OF THE PROX LAD - 95%--> BMS PCI. MILD-MODERATE (~50%) NONOBSTRUCTIVE LCX AND RCA STENOSES    CARDIAC CATHETERIZATION N/A 03/10/2015   Procedure: Coronary Stent Intervention;  Surgeon: Ozell Fell, MD;  Location: [facility] INVASIVE CV LAB: PROX LAD - 95%--> 0% post PCI Vision BMS 3.5 x 18 (3.75 mm) - BMS used b/c upcoming knee Sgx.   CHOLECYSTECTOMY     inguinal herniorrhapy Bilateral    1979 & 1997   KNEE ARTHROSCOPY Left    left   KNEE ARTHROSCOPY Left 09/17/2017   Procedure: ARTHROSCOPY KNEE, PATELLA FEMORAL  CHONDROPLASTY, PARTIAL MEDIAL MENISECTOMY;  Surgeon: Mardee Lynwood SQUIBB, MD;  Location: [facility] ORS;  Service: Orthopedics;  Laterality: Left;   Removal of right total knee implants for periprosthetic infection  10/02/2014   Right knee arthrotomy, debridement, and reinsertion of antibiotic cement spacer  12/18/2014   right knee surgury     x 9   ROTATOR CUFF REPAIR Bilateral    bilateral   TONSILLECTOMY     TOTAL KNEE ARTHROPLASTY Right  05/17/2015   Procedure: TOTAL KNEE ARTHROPLASTY, REMOVAL OF SPACER;  Surgeon: Lynwood SHAUNNA Hue, MD;  Location: [facility] ORS;  Service: Orthopedics;  Laterality: Right;   TRANSTHORACIC ECHOCARDIOGRAM  03/10/2015   EF 65 and 70%. No RWMA. ~ normal diastolic parameters     Social History: Social History   Occupational History   Not on file  Tobacco Use   Smoking status: Some Days    Current packs/day: 0.25    Average packs/day: 0.9 packs/day for 42.8 years (39.2 ttl pk-yrs)    Types: Cigarettes    Start date:  50   Smokeless tobacco: Former   Tobacco comments:    Hasn't smoked in 2 weeks as of 08/15/24  Vaping Use   Vaping status: Former  Substance and Sexual Activity   Alcohol use: No   Drug use: No   Sexual activity: Not on file     Family History: Family History  Problem Relation Age of Onset   Emphysema Mother        never smoker but spouse smoked   Lung cancer Father    Rheum arthritis Sister    Arthritis/Rheumatoid Sister    Asthma Maternal Grandmother    Allergies Son    Coronary artery disease Other    Parkinson's disease Neg Hx    Tremor Neg Hx    Otherwise, no relevant orthopaedic family history  ROS: Review of Systems: All systems reviewed and are negative except that mentioned in HPI  Work/Sport/Hobbies: See HPI  Physical Examination: Vitals:   08/15/24 0922  BP: 110/78  Pulse: 75  Resp: 17  Temp: (!) 97.5 F (36.4 C)  SpO2: 97%   Constitutional: Awake, alert.  WN/WD Appearance: healthy, no acute distress, well-groomed Affect: Normal HEENT: EOMI, mucous membranes moist CV: RRR Pulm: breathing comfortably   Left Upper Extremity / Hand Tenderness over SL interval, LT interval, and ulnar fovea of left upper extremity. Pain but no clunk on Watson scaphoid shift test.  Motor and sensation intact in all dermatomes and myotomes. Fingers warm, well perfused, palpable radial pulse.    Pertinent Labs: n/a  Imaging: I have personally reviewed the following studies: N/a  Additional Studies: n/a  Assessment/Plan: Chase Gibson is a 68 y.o. male with Left SL ligament tear, TFCC tear   Plan for wrist arthroscopy possible open SL ligament reconstruction vs PRC, PIN/AIN neurectomies, possible TFCC debridement versus repair.  Discussed surgery risks: infection, nerve/blood vessel injury, persistent pain, stiffness, potential need for additional procedures, hematoma, seroma, and complications from anesthesia.   Risks, benefits, goals, and  limitations of continued non-operative treatment as well as operative treatment options were discussed in detail with the patient. The post-operative course, and the importance of personal involvement in the rehabilitation and physical therapy process were reinforced as well, and the patient expressed understanding. We discussed the option of doing nothing as well as alternative treatments. All questions were answered to their satisfaction. The patient has elected to proceed with surgery at this time.    Marsa Christen, MD Hand and Upper Extremity Surgery [facility] [phone] 08/15/2024 10:24 AM

## 2024-08-15 NOTE — Anesthesia Postprocedure Evaluation (Signed)
 Anesthesia Post Note  Patient: Chase Gibson  Procedure(s) Performed: ARTHROSCOPY, WRIST WITH DEBRIDEMENT (Left: Wrist)     Patient location during evaluation: PACU Anesthesia Type: Regional and General Level of consciousness: awake Pain management: pain level controlled Vital Signs Assessment: post-procedure vital signs reviewed and stable Respiratory status: spontaneous breathing, nonlabored ventilation and respiratory function stable Cardiovascular status: blood pressure returned to baseline and stable Postop Assessment: no apparent nausea or vomiting Anesthetic complications: no   No notable events documented.  Last Vitals:  Vitals:   08/15/24 1349 08/15/24 1400  BP:  91/70  Pulse:  72  Resp:  (!) 26  Temp: (!) 36.2 C   SpO2:  92%    Last Pain:  Vitals:   08/15/24 1404  TempSrc:   PainSc: 10-Worst pain ever                 Shivansh Hardaway P Clatie Kessen

## 2024-08-15 NOTE — Anesthesia Procedure Notes (Signed)
 Procedure Name: Intubation Date/Time: 08/15/2024 11:44 AM  Performed by: Delayne Olam BIRCH, CRNAPre-anesthesia Checklist: Patient identified, Emergency Drugs available, Suction available and Patient being monitored Patient Re-evaluated:Patient Re-evaluated prior to induction Oxygen Delivery Method: Circle system utilized Preoxygenation: Pre-oxygenation with 100% oxygen Induction Type: IV induction Ventilation: Mask ventilation without difficulty Laryngoscope Size: Mac, 3 and McGrath Grade View: Grade I Tube type: Oral Tube size: 7.5 mm Number of attempts: 1 Airway Equipment and Method: Stylet and Oral airway Placement Confirmation: ETT inserted through vocal cords under direct vision, positive ETCO2 and breath sounds checked- equal and bilateral Secured at: 22 cm Tube secured with: Tape Dental Injury: Teeth and Oropharynx as per pre-operative assessment

## 2024-08-15 NOTE — Transfer of Care (Signed)
 Immediate Anesthesia Transfer of Care Note  Patient: Chase Gibson  Procedure(s) Performed: Procedure(s) (LRB): ARTHROSCOPY, WRIST WITH DEBRIDEMENT (Left)  Patient Location: PACU  Anesthesia Type: General  Level of Consciousness: awake, oriented, sedated and patient cooperative  Airway & Oxygen Therapy: Patient Spontanous Breathing and Patient connected to face mask oxygen  Post-op Assessment: Report given to PACU RN and Post -op Vital signs reviewed and stable  Post vital signs: Reviewed and stable  Complications: No apparent anesthesia complications  Last Vitals:  Vitals Value Taken Time  BP 122/95 08/15/24 13:48  Temp    Pulse 71 08/15/24 13:51  Resp 24 08/15/24 13:51  SpO2 96 % 08/15/24 13:51  Vitals shown include unfiled device data.  Last Pain:  Vitals:   08/15/24 0922  TempSrc: Temporal  PainSc: 10-Worst pain ever      Patients Stated Pain Goal: 5 (08/15/24 9077)  Complications: No notable events documented.

## 2024-08-15 NOTE — Progress Notes (Signed)
 Assisted Dr. Bradley Ferris with left, supraclavicular, ultrasound guided block. Side rails up, monitors on throughout procedure. See vital signs in flow sheet. Tolerated Procedure well.

## 2024-08-15 NOTE — Op Note (Signed)
 I assisted Surgeons and Role:    * Chiaramonti, Marsa HERO, MD - Primary    DEWAINE Murrell Kuba, MD - Assisting on the Procedure(s): ARTHROSCOPY, WRIST WITH DEBRIDEMENT on 08/15/2024.  I provided assistance on this case as follows: Set up, approach, arthroscopy with debridement, broach to the proximal carpectomy, protection of the neurovascular structures, resection of the AIN PIN nerves, isolation of the carpal bones, level of the carpal bones, closure of the wound and application of the dressing and splints.  Electronically signed by: Kuba Murrell, MD Date: 08/15/2024 Time: 1:34 PM

## 2024-08-15 NOTE — Anesthesia Procedure Notes (Signed)
 Procedure Name: MAC Date/Time: 08/15/2024 11:20 AM  Performed by: Delayne Olam BIRCH, CRNAPre-anesthesia Checklist: Patient identified, Emergency Drugs available, Suction available and Patient being monitored Oxygen Delivery Method: Simple face mask

## 2024-08-15 NOTE — Discharge Instructions (Addendum)
 [facility] Post-Operative Instructions   Special instructions!!! -Since you normally take hydrocodone -acetaminophen  10-325mg  every 8 hours as needed for many years, you will need to continue to take this medication as normal.  -after a few hours, if you are having breakthrough pain, you can take 5mg  of oxycodone .  This can be repeated every 4 hours as needed.  -do not take the hydrocodone -acetaminophen  at the exact same time as the oxycodone   -if the oxycodone  5mg  is not enough and you are not too sleepy/drowsy, you can take 10mg  of oxycodone  but this puts you at high risk of taking too much which can cause you to stop breathing and die.  I strongly recommend you have someone with you to check on you especially the first time you take the higher dose.  -A rescue nasal spray, Narcan, will be prescribed in case of accidentally taking too much opioid pain medication.  -Do not take any other sedating medications or sedating substances  _______________________________________   General -These instructions are to compliment information given to you by your surgeon. -You may resume your normal diet as tolerated.  -You may resume your normal medications unless specifically instructed to stop taking a certain medication. -If you are not sure about restarting one of your medications after surgery, please contact the office during normal business hours and we will be able to assist you.   Post-Operative Dressings Splint: If you have a splint on your operative arm, it should stay on with all the dressings over it until you return for follow-up. It is ok to take a shower while you are wearing your splint, as long as you do not get it wet. The splint must be covered and kept clean and dry. If your splint gets wet, please call the clinic as you may need to come to clinic early for this to be changed.   If you have questions about your dressings,  please call the clinic.   Post-operative Wound Care In general:  Any sutures or anything that will not fall off over time on their own, will be removed in clinic by our staff.  -If your surgical wound/incision was closed with non-absorbable sutures or staples, they will be removed at your first post-op visit.   -If your incision was closed with absorbable sutures, they do not need to be removed as they will dissolve on their own. They may be visible or buried underneath the skin.    You may have one of the following over your incision/wound.  They can all get wet and should remain in place until they fall off on their own. -Small stickers called steri-strips.  You do not need to remove them. If the ends start to peel up, you can carefully trim them with scissors. -Skin glue often known by the brand name Dermabond.  Do not remove, it will fall off over time.  Certain procedures such as fracture fixation may require the use of pins that stick out through the skin.  If you have visible pins, please be careful to not bump them or move them.    Regardless of any sutures, stickers or glue used to close your wound, do not submerge the wound in any standing water  for at least 4 weeks after surgery.  It is okay to let the shower water  run over the wound and gently clean the wound with soapy water  then rinse and gently pat dry.   Weightbearing/Activity  Do  not use your operative extremity for any lifting or weight bearing  Please start finger range of motion exercises right away.  If you have a splint in place, you should move all joints that are not immobilized by a splint.   Pain Control - After your surgery, post-surgical discomfort or pain is normal. This discomfort can last several days to a few weeks. At certain times of the day (usually evenings/nights) your discomfort may be more intense.  - Do not drive while taking narcotic pain medications.   Nerve Block:  If you received a nerve  block, it may provide pain relief for one hour to up to two days after your surgery. As long as the nerve block is working, you will experience little or no sensation in the area the surgeon operated on.  As the nerve block wears off, you will begin to experience pain or discomfort. It is very important that you begin taking your prescribed pain medication before the nerve block fully wears off.  Treating your pain at the first sign of the block wearing off will ensure your pain is better controlled and more tolerable when full-sensation returns. Do not wait until the pain is intolerable, as the medicine will be less effective. It is better to treat pain in advance than to try and catch up.  If the nerve block made your entire arm numb, you will be given an arm sling that you should wear until the block wears off, but no longer than 2 days unless otherwise instructed.   Pain Medication:  Typically, post operative pain can be managed by alternating tylenol  and an anti inflammatory medication.   We may also prescribe an opioid pain medication such as Oxycodone , Percocet (oxycodone  with Tylenol ) or Norco (hydrocodone  with Tylenol ) for post-operative pain. Some of these medications contain Tylenol  (acetaminophen ) in them.  It takes between 30 and 45 minutes before pain medication starts to work. It is important to take your medication before your pain level gets too intense.   Nausea is a common side effect of many pain medications. You will want to eat something before taking your pain medicine to help prevent nausea.   If you are taking a prescription opioid pain medication that contains acetaminophen  (Tylenol ), we recommend that you do not take additional over the counter acetaminophen  (Tylenol ).   If you are prescribed oxycodone  WITHOUT acetaminophen  (Tylenol ) in it and you do not have a known allergy, we recommend you take over-the-counter acetaminophen  (Tylenol ) with the oxycodone .  Take  over-the-counter stool softener such as Colace or Sennakot while taking narcotic pain medications to help prevent constipation.    Other pain relieving options:  Elevation: Elevating your operative extremity can be very helpful to reduce this pain. Prop your arm up on pillows to keep the operative site above the level of your heart. If you develop tingling from prolonged elevation, take a break from elevating and this should resolve.  Icing: If you do not have a splint/cast, using a cold pack to ice the affected area a few times a day (15 to 20 minutes at a time) can also help to relieve pain, reduce swelling and bruising.   If you can take nonsteroidal anti-inflammatory medications (NSAIDs, for example: Ibuprofen, Advil, Motrin, Aleve, etc.), you may take them to help control your pain. If you are unsure whether you can take anti-inflammatory medications, please check with your primary care provider.  If you are already taking a prescription of anti-inflammatory medications such as  Celebrex (celecoxib) or Mobic (meloxicam) you should not take any additional anti-inflammatory drugs such as Ibuprofen, Advil, Motrin, or Aleve.    Follow Up Please call [facility] at [phone] if you do not receive or are unsure of your first follow-up appointment.  You should see your surgeon or PA 10-16 days after your surgery unless otherwise instructed.    Please call the office for any problems, including the following:  - Excessive redness of the incisions - Drainage for more than 4 days - Fever of more than 101.5 F - Nausea/vomiting that does not stop  - Numbness, tingling, or discoloration of extremity  - Unable to drink fluids  - Uncontrollable pain     Marsa Christen, MD Hand and Upper Extremity Surgery [facility] [phone]    Post Anesthesia Home Care Instructions  Activity: Get plenty of rest for the remainder of the day. A  responsible individual must stay with you for 24 hours following the procedure.  For the next 24 hours, DO NOT: -Drive a car -Advertising copywriter -Drink alcoholic beverages -Take any medication unless instructed by your physician -Make any legal decisions or sign important papers.  Meals: Start with liquid foods such as gelatin or soup. Progress to regular foods as tolerated. Avoid greasy, spicy, heavy foods. If nausea and/or vomiting occur, drink only clear liquids until the nausea and/or vomiting subsides. Call your physician if vomiting continues.  Special Instructions/Symptoms: Your throat may feel dry or sore from the anesthesia or the breathing tube placed in your throat during surgery. If this causes discomfort, gargle with warm salt water . The discomfort should disappear within 24 hours.  Regional Anesthesia Blocks  1. You may not be able to move or feel the blocked extremity after a regional anesthetic block. This may last may last from 3-48 hours after placement, but it will go away. The length of time depends on the medication injected and your individual response to the medication. As the nerves start to wake up, you may experience tingling as the movement and feeling returns to your extremity. If the numbness and inability to move your extremity has not gone away after 48 hours, please call your surgeon.   2. The extremity that is blocked will need to be protected until the numbness is gone and the strength has returned. Because you cannot feel it, you will need to take extra care to avoid injury. Because it may be weak, you may have difficulty moving it or using it. You may not know what position it is in without looking at it while the block is in effect.  3. For blocks in the legs and feet, returning to weight bearing and walking needs to be done carefully. You will need to wait until the numbness is entirely gone and the strength has returned. You should be able to move your leg  and foot normally before you try and bear weight or walk. You will need someone to be with you when you first try to ensure you do not fall and possibly risk injury.  4. Bruising and tenderness at the needle site are common side effects and will resolve in a few days.  5. Persistent numbness or new problems with movement should be communicated to the surgeon or the [facility] [phone] [facility] [phone]).  Next dose of tylenol  will be after 3:30pm

## 2024-08-15 NOTE — Op Note (Signed)
 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 Kynslee Baham, M.D.   ASSISTANT: Arley Curia, MD   ANESTHESIA:  General with regional   INTRAVENOUS FLUIDS:  Per anesthesia flow sheet.   ESTIMATED BLOOD LOSS:  Minimal.   COMPLICATIONS:  None.   SPECIMENS:  none   TOURNIQUET TIME:    Total Tourniquet Time Documented: Upper Arm (Left) - 117 minutes Total: Upper Arm (Left) - 117 minutes    DISPOSITION:  Stable to PACU.   INDICATIONS:  68 year old male with chronic wrist pain and ligamentous injury after fall in February 2025.  He tried and failed non-operative treatment including bracing and steroid injections.  We reviewed the risks, benefits, and alternatives of the procedure and the patient elected to proceed.  We discussed that given the chronic nature of the injury, if the scaphoid was found to be irreducible, then a proximal row carpectomy would be performed instead of a SL ligament reconstruction.  We also discussed the PIN and AIN neurectomies to help given his significant pre-operative wrist pain.   OPERATIVE COURSE:   Patient was identified in holding.  The site, side, and surgery were confirmed with the patient.  Anesthesia performed a regional nerve block.  The patient was brought back to the operating room and transferred to the OR table.  He was positioned supine with the operative extremity on outstretched on  a radiolucent hand table.  A well-padded tourniquet was placed on the upper arm of the operative extremity.    The patient was prepped and draped in typical sterile fashion. A timeout was performed and all were in agreement.    The wrist arthroscopy traction tower was set up and 10 lbs of traction were applied.  The operative extremity was exsanguinated with Esmarch bandage and tourniquet inflated to 250 mmHg.  The dorsal 3,4 portal was identified approximately 1cm distal to Lister's tubercle by placing a 18g needle into the location, filling the joint with normal saline and then removing the syringe.  Appropriate backflow was noted.  A small longitudinal incision was made, the capsule was entered and spread with hemostats then the trochar was introduced.    A diagnostic arthroscopy was performed.  The SL ligament was found to be torn.  There was a large central TFCC tear and partial LT tear noted.  There was considerable inflammation of the joint capsule noted.  Additionally, the scaphoid was found to be irreducible.  Next the TFCC was debrided with the shaver and the suction basket biter.  The scope was then removed, the traction tower taken down and the mini C-arm was brought it.  The scaphoid was again confirmed to be irreducible on live fluoroscopy.    Next a 7cm standard midline dorsal longitudinal incision was made on the dorsal aspect of the wrist.  Full-thickness skin flaps were raised with care taken to protect the subcutaneous neurovascular structures.  Soft tissues were bluntly dissected down to the extensor retinaculum with tenotomy's  and pickups.  The extensor retinaculum was incised over the third dorsal compartment and EPL tendon was retracted radially. The 4th dorsal compartment was also opened and the contents retracted ulnarly.    The PIN was identified in the floor of the 4th compartment and a 1cm section was crushed and then cut out.    A small incision was made through the  interosseous membrane and the AIN was identified.  It was then also crushed and a 1cm segment was then cut out.  The dorsal wrist capsule was entered with a T shaped incision.  The capsule was carefully separated from the proximal carpal bones.  A combination of beaver blade, rongeur, curette, and freer elevator were used to remove the scaphoid while protecting the [facility] ligament.  Similarly, the triquetrum and then the lunate were excised.    The wound was then thoroughly irrigated with normal saline.  Hemostasis was achieved with bipolar electrocautery.  The joint capsule was closed with 4-0 Vicryl.  The retinaculum was closed with 4-0 Vicryl with the EPL tendon remaining transposed.  The skin was then closed with 4-0 Nylons.  Xeroform was placed over the wound. This was followed by gauze and sterile Webril.  Patient was then placed in a volar dorsal splint.    POST-OP PLAN:  Follow up in clinic in 10-14 days.  The patient will remain nonweightbearing in the splint for the next several weeks.  They will remain in a sling until the nerve block wears off.  At follow-up appointment he will be seen by occupational therapy and will have a brace made at that time.    MARSA CHRISTELLA CHRISTEN, MD Electronically signed, 08/15/24

## 2024-08-15 NOTE — Anesthesia Procedure Notes (Signed)
 Anesthesia Regional Block: Supraclavicular block   Pre-Anesthetic Checklist: , timeout performed,  Correct Patient, Correct Site, Correct Laterality,  Correct Procedure, Correct Position, site marked,  Risks and benefits discussed,  Surgical consent,  Pre-op evaluation,  At surgeon's request and post-op pain management  Laterality: Left  Prep: chloraprep       Needles:  Injection technique: Single-shot  Needle Type: Echogenic Stimulator Needle     Needle Length: 9cm  Needle Gauge: 21     Additional Needles:   Procedures:,,,, ultrasound used (permanent image in chart),,    Narrative:  Start time: 08/15/2024 10:40 AM End time: 08/15/2024 10:50 AM Injection made incrementally with aspirations every 5 mL.  Performed by: Personally  Anesthesiologist: Patrisha Bernardino SQUIBB, MD  Additional Notes: Functioning IV was confirmed and monitors were applied.  A timeout was performed. Sterile prep, hand hygiene and sterile gloves were used. A 90mm 21ga Arrow echogenic stimulator needle was used. Negative aspiration and negative test dose prior to incremental administration of local anesthetic. The patient tolerated the procedure well.  Ultrasound guidance: relevent anatomy identified, needle position confirmed, local anesthetic spread visualized around nerve(s), vascular puncture avoided.  Image printed for medical record.

## 2024-08-18 ENCOUNTER — Encounter (HOSPITAL_BASED_OUTPATIENT_CLINIC_OR_DEPARTMENT_OTHER): Payer: Self-pay | Admitting: Orthopedic Surgery

## 2024-08-25 ENCOUNTER — Encounter: Payer: Self-pay | Admitting: Radiology

## 2024-10-02 ENCOUNTER — Ambulatory Visit: Admitting: Nurse Practitioner

## 2024-10-02 ENCOUNTER — Encounter

## 2024-12-03 ENCOUNTER — Ambulatory Visit
